# Patient Record
Sex: Male | Born: 1970 | Race: Black or African American | Hispanic: No | Marital: Single | Smoking: Former smoker
Health system: Southern US, Community
[De-identification: ages and names within clinical notes are randomized; demographics above are authoritative.]

## PROBLEM LIST (undated history)

## (undated) DIAGNOSIS — E119 Type 2 diabetes mellitus without complications: Secondary | ICD-10-CM

## (undated) DIAGNOSIS — R091 Pleurisy: Secondary | ICD-10-CM

## (undated) DIAGNOSIS — K0510 Chronic gingivitis, plaque induced: Secondary | ICD-10-CM

## (undated) DIAGNOSIS — I10 Essential (primary) hypertension: Secondary | ICD-10-CM

## (undated) DIAGNOSIS — K219 Gastro-esophageal reflux disease without esophagitis: Secondary | ICD-10-CM

## (undated) DIAGNOSIS — K859 Acute pancreatitis without necrosis or infection, unspecified: Secondary | ICD-10-CM

## (undated) HISTORY — PX: TONSILLECTOMY: SUR1361

---

## 2000-01-26 ENCOUNTER — Encounter: Payer: Self-pay | Admitting: Emergency Medicine

## 2000-01-26 ENCOUNTER — Emergency Department (HOSPITAL_COMMUNITY): Admission: EM | Admit: 2000-01-26 | Discharge: 2000-01-26 | Payer: Self-pay | Admitting: Emergency Medicine

## 2000-01-28 ENCOUNTER — Emergency Department (HOSPITAL_COMMUNITY): Admission: EM | Admit: 2000-01-28 | Discharge: 2000-01-28 | Payer: Self-pay | Admitting: Emergency Medicine

## 2003-12-19 ENCOUNTER — Emergency Department (HOSPITAL_COMMUNITY): Admission: EM | Admit: 2003-12-19 | Discharge: 2003-12-19 | Payer: Self-pay | Admitting: Emergency Medicine

## 2004-08-15 ENCOUNTER — Emergency Department (HOSPITAL_COMMUNITY): Admission: EM | Admit: 2004-08-15 | Discharge: 2004-08-16 | Payer: Self-pay | Admitting: Emergency Medicine

## 2005-05-07 ENCOUNTER — Emergency Department (HOSPITAL_COMMUNITY): Admission: EM | Admit: 2005-05-07 | Discharge: 2005-05-07 | Payer: Self-pay | Admitting: Emergency Medicine

## 2005-07-28 ENCOUNTER — Emergency Department (HOSPITAL_COMMUNITY): Admission: EM | Admit: 2005-07-28 | Discharge: 2005-07-28 | Payer: Self-pay | Admitting: Emergency Medicine

## 2005-07-29 ENCOUNTER — Emergency Department (HOSPITAL_COMMUNITY): Admission: EM | Admit: 2005-07-29 | Discharge: 2005-07-30 | Payer: Self-pay | Admitting: Emergency Medicine

## 2007-09-12 ENCOUNTER — Emergency Department (HOSPITAL_COMMUNITY): Admission: EM | Admit: 2007-09-12 | Discharge: 2007-09-12 | Payer: Self-pay | Admitting: Emergency Medicine

## 2010-12-09 ENCOUNTER — Emergency Department (HOSPITAL_BASED_OUTPATIENT_CLINIC_OR_DEPARTMENT_OTHER)
Admission: EM | Admit: 2010-12-09 | Discharge: 2010-12-09 | Disposition: A | Discharge status: Home / Self Care | Payer: Self-pay | Attending: Emergency Medicine | Admitting: Emergency Medicine

## 2010-12-09 DIAGNOSIS — N509 Disorder of male genital organs, unspecified: Secondary | ICD-10-CM | POA: Insufficient documentation

## 2010-12-09 LAB — URINALYSIS, ROUTINE W REFLEX MICROSCOPIC
Bilirubin Urine: NEGATIVE
Hgb urine dipstick: NEGATIVE
Ketones, ur: 15 mg/dL — AB
Nitrite: NEGATIVE
Protein, ur: NEGATIVE mg/dL
Specific Gravity, Urine: 1.028 (ref 1.005–1.030)
Urine Glucose, Fasting: NEGATIVE mg/dL
Urobilinogen, UA: 1 mg/dL (ref 0.0–1.0)
pH: 6.5 (ref 5.0–8.0)

## 2011-04-08 ENCOUNTER — Emergency Department (HOSPITAL_BASED_OUTPATIENT_CLINIC_OR_DEPARTMENT_OTHER)
Admission: EM | Admit: 2011-04-08 | Discharge: 2011-04-08 | Disposition: A | Discharge status: Home / Self Care | Payer: Self-pay | Attending: Emergency Medicine | Admitting: Emergency Medicine

## 2011-04-08 DIAGNOSIS — K089 Disorder of teeth and supporting structures, unspecified: Secondary | ICD-10-CM | POA: Insufficient documentation

## 2011-05-19 ENCOUNTER — Other Ambulatory Visit: Payer: Self-pay

## 2011-05-19 ENCOUNTER — Emergency Department (HOSPITAL_BASED_OUTPATIENT_CLINIC_OR_DEPARTMENT_OTHER)
Admission: EM | Admit: 2011-05-19 | Discharge: 2011-05-20 | Disposition: A | Discharge status: Home / Self Care | Payer: Self-pay | Attending: Emergency Medicine | Admitting: Emergency Medicine

## 2011-05-19 ENCOUNTER — Encounter: Payer: Self-pay | Admitting: *Deleted

## 2011-05-19 DIAGNOSIS — I10 Essential (primary) hypertension: Secondary | ICD-10-CM | POA: Insufficient documentation

## 2011-05-19 DIAGNOSIS — R079 Chest pain, unspecified: Secondary | ICD-10-CM | POA: Insufficient documentation

## 2011-05-19 HISTORY — DX: Essential (primary) hypertension: I10

## 2011-05-19 NOTE — ED Provider Notes (Signed)
History    Scribed for Aaron Roots, MD, the patient was seen in room MH06/MH06. This chart was scribed by Clarita Crane. This patient's care was started at 11:47PM.  CSN: [id number] Arrival date & time: 05/19/2011 11:02 PM  Chief Complaint  Patient presents with  . Chest Pain   HPI Patient is a male c/o constant non-radiating sternal chest pain described as a tightness onset 3 days ago and persistent since. Denies n/v/d, fever, chills, cough, diaphoresis, back pain, abdominal pain, numbness, tingling, palpitations, swelling of extremities. States pain is not aggravated or relieved by anything. When asked, states chest pain is not aggravated with deep breathing. Reports he had a stress test/cardiac craterization performed approximately 10 years ago. Denies personal h/o heart disease and DVT and notes his father was dx with cardiomegaly at approximately 42 years of age. Patient is a current everyday smoker, non-drinker and denies drug abuse.   [facility]- [facility]  PAST MEDICAL HISTORY:  Past Medical History  Diagnosis Date  . Hypertension     PAST SURGICAL HISTORY:  Past Surgical History  Procedure Date  . Tonsillectomy     MEDICATIONS:  Previous Medications   No medications on file     ALLERGIES:  Allergies as of 05/19/2011  . (No Known Allergies)     FAMILY HISTORY:  No family history on file.   SOCIAL HISTORY: History   Social History  . Marital Status: Single    Spouse Name: N/A    Number of Children: N/A  . Years of Education: N/A   Social History Main Topics  . Smoking status: Current Everyday Smoker  . Smokeless tobacco: None  . Alcohol Use: No  . Drug Use: No  . Sexually Active:    Other Topics Concern  . None   Social History Narrative  . None       Review of Systems 10 Systems reviewed and are negative for acute change except as noted in the HPI.  Physical Exam  BP 123/93  Pulse 94  Temp(Src) 98.2 F (36.8 C) (Oral)  Resp 16  SpO2  98%  Physical Exam  Nursing note and vitals reviewed. Constitutional: He is oriented to person, place, and time. Vital signs are normal. He appears well-developed and well-nourished.  HENT:  Head: Normocephalic and atraumatic.  Eyes: EOM are normal. No scleral icterus.  Neck: Neck supple.  Cardiovascular: Normal rate, regular rhythm, normal heart sounds and intact distal pulses.  Exam reveals no gallop and no friction rub.   No murmur heard. Pulmonary/Chest: Effort normal and breath sounds normal. He has no wheezes. He has no rales. He exhibits no tenderness.  Abdominal: Soft. Bowel sounds are normal. He exhibits no distension. There is no tenderness.  Musculoskeletal: Normal range of motion. He exhibits no edema and no tenderness.       Entire spine non-tender.   Neurological: He is alert and oriented to person, place, and time. No sensory deficit.  Skin: Skin is warm and dry.  Psychiatric: He has a normal mood and affect. His behavior is normal.    ED Course  Procedures  MDM Labs, iv ns. Monitor. Cxr.  ecg sinus rhythm, ratge 84, right axis, normal qrs, nonspec st changes.  Pt denies fam hx premature cad, states grandfather had unspecf heart problems.  Pt also requests refill of norvasc 5 mg for bp.   Results for orders placed during the hospital encounter of 05/19/11  CBC      Component Value Range  WBC 8.9  4.0 - 10.5 (K/uL)   RBC 5.19  4.22 - 5.81 (MIL/uL)   Hemoglobin 16.0  13.0 - 17.0 (g/dL)   HCT 62.1  30.8 - 65.7 (%)   MCV 87.5  78.0 - 100.0 (fL)   MCH 30.8  26.0 - 34.0 (pg)   MCHC 35.2  30.0 - 36.0 (g/dL)   RDW 84.6  96.2 - 95.2 (%)   Platelets 292  150 - 400 (K/uL)  CARDIAC PANEL(CRET KIN+CKTOT+MB+TROPI)      Component Value Range   Total CK 130  7 - 232 (U/L)   CK, MB 1.0  0.3 - 4.0 (ng/mL)   Troponin I <0.30  <0.30 (ng/mL)   Relative Index 0.8  0.0 - 2.5   COMPREHENSIVE METABOLIC PANEL      Component Value Range   Sodium 137  135 - 145 (mEq/L)    Potassium 4.0  3.5 - 5.1 (mEq/L)   Chloride 102  96 - 112 (mEq/L)   CO2 24  19 - 32 (mEq/L)   Glucose, Bld 91  70 - 99 (mg/dL)   BUN 11  6 - 23 (mg/dL)   Creatinine, Ser 8.41  0.50 - 1.35 (mg/dL)   Calcium 9.7  8.4 - 32.4 (mg/dL)   Total Protein 7.8  6.0 - 8.3 (g/dL)   Albumin 3.8  3.5 - 5.2 (g/dL)   AST 21  0 - 37 (U/L)   ALT 21  0 - 53 (U/L)   Alkaline Phosphatase 91  39 - 117 (U/L)   Total Bilirubin 0.2 (*) 0.3 - 1.2 (mg/dL)   GFR calc non Af Amer >60  >60 (mL/min)   GFR calc Af Amer >60  >60 (mL/min)    Dg Chest 2 View  05/20/2011  *RADIOLOGY REPORT*  Clinical Data: Chest pain and shortness of breath.  CHEST - 2 VIEW  Comparison: Chest x-ray 07/29/2005.  Findings: The cardiac silhouette, mediastinal and hilar contours are within normal limits and stable.  The lungs are clear.  The bony thorax is intact.  IMPRESSION: No acute cardiopulmonary findings.  Original Report Authenticated By: P. Loralie Champagne, M.D.   Pt reports constant symptoms for past 3 days after which ckmb/trop normal. Pt reports hx gerd, will rec symptomatic tx for same. Will also refer for close cardiology follow up and possible stress testing.   I personally performed the services described in this documentation, which was scribed in my presence. The recorded information has been reviewed and considered. Aaron Roots, MD    Aaron Roots, MD 05/20/11 [phone]

## 2011-05-19 NOTE — ED Notes (Signed)
Chest pain for several days °

## 2011-05-20 ENCOUNTER — Emergency Department (INDEPENDENT_AMBULATORY_CARE_PROVIDER_SITE_OTHER): Payer: Self-pay

## 2011-05-20 DIAGNOSIS — R0602 Shortness of breath: Secondary | ICD-10-CM

## 2011-05-20 DIAGNOSIS — R079 Chest pain, unspecified: Secondary | ICD-10-CM

## 2011-05-20 LAB — COMPREHENSIVE METABOLIC PANEL
ALT: 21 U/L (ref 0–53)
AST: 21 U/L (ref 0–37)
Alkaline Phosphatase: 91 U/L (ref 39–117)
CO2: 24 mEq/L (ref 19–32)
GFR calc Af Amer: >60 mL/min (ref >60)
GFR calc non Af Amer: >60 mL/min (ref >60)
Glucose, Bld: 91 mg/dL (ref 70–99)
Potassium: 4 mEq/L (ref 3.5–5.1)
Sodium: 137 mEq/L (ref 135–145)
Total Protein: 7.8 g/dL (ref 6.0–8.3)

## 2011-05-20 LAB — CBC
HCT: 45.4 % (ref 39.0–52.0)
Hemoglobin: 16 g/dL (ref 13.0–17.0)
MCH: 30.8 pg (ref 26.0–34.0)
MCHC: 35.2 g/dL (ref 30.0–36.0)
RBC: 5.19 MIL/uL (ref 4.22–5.81)

## 2011-05-20 LAB — CARDIAC PANEL(CRET KIN+CKTOT+MB+TROPI): Relative Index: 0.8 (ref 0.0–2.5)

## 2011-05-20 MED ORDER — AMLODIPINE BESYLATE 5 MG PO TABS: ORAL_TABLET | ORAL | Status: DC

## 2011-05-20 NOTE — ED Notes (Signed)
Pt presents to ED today with c/o CP for the last week.  Pt states pain in non-radiaitng mid sternal pain which is 10/10 at worst.  Pt undressed and placed in gown.  IV started and labs sent.  EKG done in triage.  Pt placed on cardiac monitor

## 2011-11-11 ENCOUNTER — Emergency Department (HOSPITAL_BASED_OUTPATIENT_CLINIC_OR_DEPARTMENT_OTHER)
Admission: EM | Admit: 2011-11-11 | Discharge: 2011-11-11 | Disposition: A | Discharge status: Home / Self Care | Payer: Self-pay | Attending: Emergency Medicine | Admitting: Emergency Medicine

## 2011-11-11 ENCOUNTER — Encounter (HOSPITAL_BASED_OUTPATIENT_CLINIC_OR_DEPARTMENT_OTHER): Payer: Self-pay | Admitting: *Deleted

## 2011-11-11 DIAGNOSIS — R091 Pleurisy: Secondary | ICD-10-CM | POA: Insufficient documentation

## 2011-11-11 DIAGNOSIS — R059 Cough, unspecified: Secondary | ICD-10-CM | POA: Insufficient documentation

## 2011-11-11 DIAGNOSIS — R05 Cough: Secondary | ICD-10-CM | POA: Insufficient documentation

## 2011-11-11 DIAGNOSIS — J40 Bronchitis, not specified as acute or chronic: Secondary | ICD-10-CM | POA: Insufficient documentation

## 2011-11-11 DIAGNOSIS — I10 Essential (primary) hypertension: Secondary | ICD-10-CM | POA: Insufficient documentation

## 2011-11-11 DIAGNOSIS — K219 Gastro-esophageal reflux disease without esophagitis: Secondary | ICD-10-CM | POA: Insufficient documentation

## 2011-11-11 DIAGNOSIS — F17200 Nicotine dependence, unspecified, uncomplicated: Secondary | ICD-10-CM | POA: Insufficient documentation

## 2011-11-11 DIAGNOSIS — Z79899 Other long term (current) drug therapy: Secondary | ICD-10-CM | POA: Insufficient documentation

## 2011-11-11 HISTORY — DX: Gastro-esophageal reflux disease without esophagitis: K21.9

## 2011-11-11 MED ORDER — AMLODIPINE BESYLATE 5 MG PO TABS: 5.0000 mg | ORAL_TABLET | Freq: Every day | ORAL | Status: DC

## 2011-11-11 MED ORDER — DEXLANSOPRAZOLE 30 MG PO CPDR: 30.0000 mg | DELAYED_RELEASE_CAPSULE | Freq: Every day | ORAL | Status: AC

## 2011-11-11 MED ORDER — PREDNISONE 20 MG PO TABS: 40.0000 mg | ORAL_TABLET | Freq: Every day | ORAL | Status: DC

## 2011-11-11 MED ORDER — IBUPROFEN 600 MG PO TABS: 600.0000 mg | ORAL_TABLET | Freq: Four times a day (QID) | ORAL | Status: AC | PRN

## 2011-11-11 NOTE — ED Provider Notes (Signed)
History     CSN: [id number]  Arrival date & time 11/11/11  0703   First MD Initiated Contact with Patient 11/11/11 0710      Chief Complaint  Patient presents with  . Bronchitis  . Cough     HPI Pt reports he has had cough and congestion pain in mid chest when he takes a deep breath. Pt states he saw his pmd on Thursday had ekg, labs and a stress test states he was told that all cardiac tests were normal with no blockages etc. States he has had bronchitis several times in past and that is what this feels like. States cough productive but clear to white in color. Pain with deep inspiration and and chest wall tenderness. Denies nausea vomiting or diarrhea denies fever or chills.  Had recent chest x-ray done at his doctor's office which he was told was negative.  Patient is a smoker.  Past Medical History  Diagnosis Date  . Hypertension   . GERD (gastroesophageal reflux disease)     Past Surgical History  Procedure Date  . Tonsillectomy     History reviewed. No pertinent family history.  History  Substance Use Topics  . Smoking status: Current Everyday Smoker  . Smokeless tobacco: Not on file  . Alcohol Use: No      Review of Systems Negative except as noted in history of present illness Allergies  Review of patient's allergies indicates no known allergies.  Home Medications   Current Outpatient Rx  Name Route Sig Dispense Refill  . AMLODIPINE BESYLATE 5 MG PO TABS  One (1) po once a day 30 tablet 0  . AMLODIPINE BESYLATE 5 MG PO TABS Oral Take 1 tablet (5 mg total) by mouth daily. 30 tablet 0  . DEXLANSOPRAZOLE 30 MG PO CPDR Oral Take 1 capsule (30 mg total) by mouth daily. 30 capsule 0  . IBUPROFEN 600 MG PO TABS Oral Take 1 tablet (600 mg total) by mouth every 6 (six) hours as needed for pain. 30 tablet 0  . PREDNISONE 20 MG PO TABS Oral Take 2 tablets (40 mg total) by mouth daily. 10 tablet 0    BP 128/86  Pulse 78  Temp(Src) 97.9 F (36.6 C) (Oral)  Resp  18  Wt 234 lb 5 oz (106.283 kg)  SpO2 100%  Physical Exam  Nursing note and vitals reviewed. Constitutional: He is oriented to person, place, and time. He appears well-developed and well-nourished. No distress.  HENT:  Head: Normocephalic and atraumatic.  Eyes: Pupils are equal, round, and reactive to light.  Neck: Normal range of motion.  Cardiovascular: Normal rate and intact distal pulses.   Pulmonary/Chest: No respiratory distress. He has no wheezes. He has no rales.  Abdominal: Normal appearance. He exhibits no distension.  Musculoskeletal: Normal range of motion.  Neurological: He is alert and oriented to person, place, and time. No cranial nerve deficit.  Skin: Skin is warm and dry. No rash noted.  Psychiatric: He has a normal mood and affect. His behavior is normal.    ED Course  Procedures (including critical care time)  Labs Reviewed - No data to display No results found.   1. Pleurisy   2. Bronchitis       MDM          Nelia Shi, MD 11/11/11 [phone]

## 2011-11-11 NOTE — ED Notes (Signed)
Pt reports he has had cough and congestion pain in mid chest when he takes a deep breath. Pt states he saw his pmd on Thursday had ekg, labs and a stress test states he was told that all cardiac tests were normal with no blockages etc. States he has had bronchitis several times in past and that is what this feels like. States cough productive but clear to white in color. Pain with deep inspiration and and chest wall tenderness. Denies nausea vomiting or diarrhea denies fever or chills

## 2011-12-18 ENCOUNTER — Encounter (HOSPITAL_BASED_OUTPATIENT_CLINIC_OR_DEPARTMENT_OTHER): Payer: Self-pay | Admitting: *Deleted

## 2011-12-18 ENCOUNTER — Emergency Department (HOSPITAL_BASED_OUTPATIENT_CLINIC_OR_DEPARTMENT_OTHER)
Admission: EM | Admit: 2011-12-18 | Discharge: 2011-12-18 | Disposition: A | Discharge status: Home / Self Care | Payer: Self-pay | Attending: Emergency Medicine | Admitting: Emergency Medicine

## 2011-12-18 DIAGNOSIS — J329 Chronic sinusitis, unspecified: Secondary | ICD-10-CM | POA: Insufficient documentation

## 2011-12-18 DIAGNOSIS — J069 Acute upper respiratory infection, unspecified: Secondary | ICD-10-CM | POA: Insufficient documentation

## 2011-12-18 DIAGNOSIS — K219 Gastro-esophageal reflux disease without esophagitis: Secondary | ICD-10-CM | POA: Insufficient documentation

## 2011-12-18 DIAGNOSIS — F17200 Nicotine dependence, unspecified, uncomplicated: Secondary | ICD-10-CM | POA: Insufficient documentation

## 2011-12-18 DIAGNOSIS — I10 Essential (primary) hypertension: Secondary | ICD-10-CM | POA: Insufficient documentation

## 2011-12-18 MED ORDER — AMOXICILLIN 500 MG PO CAPS: 500.0000 mg | ORAL_CAPSULE | Freq: Three times a day (TID) | ORAL | Status: AC

## 2011-12-18 MED ORDER — CETIRIZINE-PSEUDOEPHEDRINE ER 5-120 MG PO TB12: 1.0000 | ORAL_TABLET | Freq: Two times a day (BID) | ORAL | Status: AC | PRN

## 2011-12-18 NOTE — ED Notes (Signed)
Patient states he has had sinus congestion , productive cough with yellow brown secretions.  No fever.  Using otc with minimal relief.

## 2011-12-18 NOTE — Discharge Instructions (Signed)
Take antibiotic as prescribed. Take zyrtec d as need for congestion. Take tylenol/motrin as need. Follow up with primary care doctor in 1 week if symptoms fail to improve/resolve. Return to ER if worse, trouble breathing, severe headache, other concern.     Sinusitis Sinuses are air pockets within the bones of your face. The growth of bacteria within a sinus leads to infection. The infection prevents the sinuses from draining. This infection is called sinusitis. SYMPTOMS  There will be different areas of pain depending on which sinuses have become infected.  The maxillary sinuses often produce pain beneath the eyes.   Frontal sinusitis may cause pain in the middle of the forehead and above the eyes.  Other problems (symptoms) include:  Toothaches.   Colored, pus-like (purulent) drainage from the nose.   Swelling, warmth, and tenderness over the sinus areas may be signs of infection.  TREATMENT  Sinusitis is most often determined by an exam.X-rays may be taken. If x-rays have been taken, make sure you obtain your results or find out how you are to obtain them. Your caregiver may give you medications (antibiotics). These are medications that will help kill the bacteria causing the infection. You may also be given a medication (decongestant) that helps to reduce sinus swelling.  HOME CARE INSTRUCTIONS   Only take over-the-counter or prescription medicines for pain, discomfort, or fever as directed by your caregiver.   Drink extra fluids. Fluids help thin the mucus so your sinuses can drain more easily.   Applying either moist heat or ice packs to the sinus areas may help relieve discomfort.   Use saline nasal sprays to help moisten your sinuses. The sprays can be found at your local drugstore.  SEEK IMMEDIATE MEDICAL CARE IF:  You have a fever.   You have increasing pain, severe headaches, or toothache.   You have nausea, vomiting, or drowsiness.   You develop unusual swelling  around the face or trouble seeing.  MAKE SURE YOU:   Understand these instructions.   Will watch your condition.   Will get help right away if you are not doing well or get worse.  Document Released: 09/29/2005 Document Revised: 09/18/2011 Document Reviewed: 04/28/2007 [facility] Patient Information 2012 [place], [place].      Upper Respiratory Infection, Adult An upper respiratory infection (URI) is also known as the common cold. It is often caused by a type of germ (virus). Colds are easily spread (contagious). You can pass it to others by kissing, coughing, sneezing, or drinking out of the same glass. Usually, you get better in 1 or 2 weeks.  HOME CARE   Only take medicine as told by your doctor.   Use a warm mist humidifier or breathe in steam from a hot shower.   Drink enough water and fluids to keep your pee (urine) clear or pale yellow.   Get plenty of rest.   Return to work when your temperature is back to normal or as told by your doctor. You may use a face mask and wash your hands to stop your cold from spreading.  GET HELP RIGHT AWAY IF:   After the first few days, you feel you are getting worse.   You have questions about your medicine.   You have chills, shortness of breath, or brown or red spit (mucus).   You have yellow or brown snot (nasal discharge) or pain in the face, especially when you bend forward.   You have a fever, puffy (swollen) neck, pain  when you swallow, or white spots in the back of your throat.   You have a bad headache, ear pain, sinus pain, or chest pain.   You have a high-pitched whistling sound when you breathe in and out (wheezing).   You have a lasting cough or cough up blood.   You have sore muscles or a stiff neck.  MAKE SURE YOU:   Understand these instructions.   Will watch your condition.   Will get help right away if you are not doing well or get worse.  Document Released: 03/17/2008 Document Revised: 09/18/2011 Document  Reviewed: 02/03/2011 [facility] Patient Information 2012 [place], [place].

## 2011-12-18 NOTE — ED Provider Notes (Signed)
History     CSN: [id number]  Arrival date & time 12/18/11  1743   First MD Initiated Contact with Patient 12/18/11 1814      Chief Complaint  Patient presents with  . URI    (Consider location/radiation/quality/duration/timing/severity/associated sxs/prior treatment) Patient is a 41 y.o. male presenting with URI. The history is provided by the patient.  URI Primary symptoms do not include abdominal pain or rash.  The illness is not associated with chills.  pt c/o nasal congestion, drainage, sinus pain, subj fever in past few days. Also notes occ non productive cough, no sore throat. Dull frontal facial and sinus pain/headache, mild to mod. Gradual onset. No neck pain or stiffness. No trouble breathing or swallowing. No nvd. No chest pain.   Past Medical History  Diagnosis Date  . Hypertension   . GERD (gastroesophageal reflux disease)     Past Surgical History  Procedure Date  . Tonsillectomy     No family history on file.  History  Substance Use Topics  . Smoking status: Current Everyday Smoker -- 0.5 packs/day    Types: Cigars  . Smokeless tobacco: Not on file  . Alcohol Use: No      Review of Systems  Constitutional: Negative for chills.  HENT: Negative for neck pain and neck stiffness.   Eyes: Negative for redness.  Respiratory: Negative for shortness of breath.   Cardiovascular: Negative for chest pain.  Gastrointestinal: Negative for abdominal pain.  Genitourinary: Negative for flank pain.  Musculoskeletal: Negative for back pain.  Skin: Negative for rash.  Neurological: Negative for weakness, light-headedness and numbness.  Hematological: Does not bruise/bleed easily.  Psychiatric/Behavioral: Negative for confusion.    Allergies  Review of patient's allergies indicates no known allergies.  Home Medications   Current Outpatient Rx  Name Route Sig Dispense Refill  . AMLODIPINE BESYLATE 5 MG PO TABS Oral Take 5 mg by mouth daily.    .  DEXLANSOPRAZOLE 60 MG PO CPDR Oral Take 60 mg by mouth daily.    Marland Kitchen LORATADINE 10 MG PO TABS Oral Take 10 mg by mouth daily.    Marland Kitchen PHENYLEPHRINE HCL 10 MG PO TABS Oral Take 10 mg by mouth every 4 (four) hours as needed. For congestion      BP 133/87  Pulse 106  Temp(Src) 98.4 F (36.9 C) (Oral)  Resp 18  Ht 5\' 11"  (1.803 m)  Wt 235 lb (106.595 kg)  BMI 32.78 kg/m2  SpO2 97%  Physical Exam  Nursing note and vitals reviewed. Constitutional: He is oriented to person, place, and time. He appears well-developed and well-nourished. No distress.  HENT:  Head: Atraumatic.  Right Ear: External ear normal.  Left Ear: External ear normal.  Mouth/Throat: Oropharynx is clear and moist.       Nasal congestion. +bil maxillary/frontal sinus tenderness  Eyes: Pupils are equal, round, and reactive to light.  Neck: Neck supple. No tracheal deviation present.       No stiffness or rigidity  Cardiovascular: Normal rate, regular rhythm, normal heart sounds and intact distal pulses.  Exam reveals no gallop and no friction rub.   No murmur heard. Pulmonary/Chest: Effort normal and breath sounds normal. No accessory muscle usage. No respiratory distress.  Abdominal: Soft. He exhibits no distension. There is no tenderness.  Musculoskeletal: Normal range of motion. He exhibits no edema and no tenderness.  Lymphadenopathy:    He has no cervical adenopathy.  Neurological: He is alert and oriented to person, place, and  time.       Motor intact bil. Steady gait.   Skin: Skin is warm and dry. No rash noted.  Psychiatric: He has a normal mood and affect.    ED Course  Procedures (including critical care time)    MDM  Exam c/w sinusitis. Confirmed nkda w pt.         Suzi Roots, MD 12/18/11 [phone]

## 2011-12-20 ENCOUNTER — Emergency Department (INDEPENDENT_AMBULATORY_CARE_PROVIDER_SITE_OTHER): Payer: Self-pay

## 2011-12-20 ENCOUNTER — Other Ambulatory Visit: Payer: Self-pay

## 2011-12-20 ENCOUNTER — Encounter (HOSPITAL_BASED_OUTPATIENT_CLINIC_OR_DEPARTMENT_OTHER): Payer: Self-pay | Admitting: *Deleted

## 2011-12-20 ENCOUNTER — Emergency Department (HOSPITAL_BASED_OUTPATIENT_CLINIC_OR_DEPARTMENT_OTHER)
Admission: EM | Admit: 2011-12-20 | Discharge: 2011-12-20 | Disposition: A | Discharge status: Home Health | Payer: Self-pay | Attending: Emergency Medicine | Admitting: Emergency Medicine

## 2011-12-20 DIAGNOSIS — R079 Chest pain, unspecified: Secondary | ICD-10-CM

## 2011-12-20 DIAGNOSIS — Z79899 Other long term (current) drug therapy: Secondary | ICD-10-CM | POA: Insufficient documentation

## 2011-12-20 DIAGNOSIS — I10 Essential (primary) hypertension: Secondary | ICD-10-CM | POA: Insufficient documentation

## 2011-12-20 DIAGNOSIS — F17200 Nicotine dependence, unspecified, uncomplicated: Secondary | ICD-10-CM | POA: Insufficient documentation

## 2011-12-20 DIAGNOSIS — K219 Gastro-esophageal reflux disease without esophagitis: Secondary | ICD-10-CM | POA: Insufficient documentation

## 2011-12-20 LAB — DIFFERENTIAL
Band Neutrophils: 0 % (ref 0–10)
Basophils Absolute: 0 10*3/uL (ref 0.0–0.1)
Basophils Relative: 0 % (ref 0–1)
Blasts: 0 %
Lymphocytes Relative: 52 % — ABNORMAL HIGH (ref 12–46)
Lymphs Abs: 3.3 10*3/uL (ref 0.7–4.0)
Myelocytes: 0 %
Promyelocytes Absolute: 0 %

## 2011-12-20 LAB — CBC
HCT: 46.2 % (ref 39.0–52.0)
Hemoglobin: 16.5 g/dL (ref 13.0–17.0)
MCH: 31.1 pg (ref 26.0–34.0)
MCHC: 35.7 g/dL (ref 30.0–36.0)
RDW: 14.3 % (ref 11.5–15.5)

## 2011-12-20 LAB — BASIC METABOLIC PANEL
Calcium: 10.4 mg/dL (ref 8.4–10.5)
GFR calc Af Amer: >90 mL/min (ref >90)
GFR calc non Af Amer: 81 mL/min — ABNORMAL LOW (ref >90)
Potassium: 4.2 mEq/L (ref 3.5–5.1)
Sodium: 139 mEq/L (ref 135–145)

## 2011-12-20 LAB — TROPONIN I: Troponin I: <0.3 ng/mL (ref <0.30)

## 2011-12-20 MED ORDER — GI COCKTAIL ~~LOC~~
30.0000 mL | Freq: Once | ORAL | Status: AC
  Administered 2011-12-20: 30 mL via ORAL
  Filled 2011-12-20: qty 30

## 2011-12-20 NOTE — ED Provider Notes (Signed)
Medical screening examination/treatment/procedure(s) were performed by non-physician practitioner and as supervising physician I was immediately available for consultation/collaboration.   Glynn Octave, MD 12/20/11 1731

## 2011-12-20 NOTE — ED Notes (Signed)
Explained to patient that there was a lab delay, but patient refused to wait,

## 2011-12-20 NOTE — ED Notes (Signed)
Explained to patient that labs were taking longer than normal today and that we would do everything possible to expeditite  process

## 2011-12-20 NOTE — ED Notes (Signed)
Tried to call patient to explain lab results, no answer, will try to call again

## 2011-12-20 NOTE — ED Provider Notes (Signed)
History     CSN: [id number]  Arrival date & time 12/20/11  1208   First MD Initiated Contact with Patient 12/20/11 1214      No chief complaint on file.   (Consider location/radiation/quality/duration/timing/severity/associated sxs/prior treatment) HPI Comments: Pt states that he woke up this morning and burning intermittently in his chest:pt states that he denies sob:pt states that nothing makes it better or worse:pt states that he has been moving furniture the last couple of days:nothing makes the pain better or worse:pt states that he has been taking his gerd medication  Patient is a 41 y.o. male presenting with chest pain. The history is provided by the patient. No language interpreter was used.  Chest Pain The chest pain began 1 - 2 hours ago. Chest pain occurs intermittently. The chest pain is unchanged. The quality of the pain is described as burning. The pain does not radiate. Pertinent negatives for primary symptoms include no fever, no syncope, no shortness of breath, no nausea, no vomiting and no dizziness.  Pertinent negatives for associated symptoms include no diaphoresis, no near-syncope and no weakness. Treatments tried: muscle relaxer. Risk factors include smoking/tobacco exposure.  His past medical history is significant for hypertension.  His family medical history is significant for hyperlipidemia in family.     Past Medical History  Diagnosis Date  . Hypertension   . GERD (gastroesophageal reflux disease)     Past Surgical History  Procedure Date  . Tonsillectomy     No family history on file.  History  Substance Use Topics  . Smoking status: Current Everyday Smoker -- 0.5 packs/day    Types: Cigars  . Smokeless tobacco: Not on file  . Alcohol Use: No      Review of Systems  Constitutional: Negative for fever and diaphoresis.  Respiratory: Negative for shortness of breath.   Cardiovascular: Positive for chest pain. Negative for syncope and  near-syncope.  Gastrointestinal: Negative for nausea and vomiting.  Neurological: Negative for dizziness and weakness.  All other systems reviewed and are negative.    Allergies  Review of patient's allergies indicates no known allergies.  Home Medications   Current Outpatient Rx  Name Route Sig Dispense Refill  . AMLODIPINE BESYLATE 5 MG PO TABS Oral Take 5 mg by mouth daily.    . AMOXICILLIN 500 MG PO CAPS Oral Take 1 capsule (500 mg total) by mouth 3 (three) times daily. 30 capsule 0  . CETIRIZINE-PSEUDOEPHEDRINE ER 5-120 MG PO TB12 Oral Take 1 tablet by mouth 2 (two) times daily as needed for allergies. 20 tablet 0  . DEXLANSOPRAZOLE 60 MG PO CPDR Oral Take 60 mg by mouth daily.    Marland Kitchen LORATADINE 10 MG PO TABS Oral Take 10 mg by mouth daily.    Marland Kitchen PHENYLEPHRINE HCL 10 MG PO TABS Oral Take 10 mg by mouth every 4 (four) hours as needed. For congestion      BP 117/84  Pulse 96  Temp(Src) 98.1 F (36.7 C) (Oral)  Resp 16  Ht 5\' 11"  (1.803 m)  Wt 239 lb (108.41 kg)  BMI 33.33 kg/m2  SpO2 96%  Physical Exam  Nursing note and vitals reviewed. Constitutional: He is oriented to person, place, and time. He appears well-developed and well-nourished.  HENT:  Head: Normocephalic and atraumatic.  Neck: Neck supple.  Cardiovascular: Normal rate and regular rhythm.   Pulmonary/Chest: Effort normal and breath sounds normal. He exhibits no tenderness.  Abdominal: Bowel sounds are normal. There is no tenderness.  Musculoskeletal: Normal range of motion.  Neurological: He is alert and oriented to person, place, and time.  Skin: Skin is warm.  Psychiatric: He has a normal mood and affect.    ED Course  Procedures (including critical care time)  Labs Reviewed  DIFFERENTIAL - Abnormal; Notable for the following:    Neutrophils Relative 36 (*)    Lymphocytes Relative 52 (*)    All other components within normal limits  CBC  TROPONIN I  BASIC METABOLIC PANEL  TROPONIN I    PATHOLOGIST SMEAR REVIEW  BASIC METABOLIC PANEL   No results found.  Date: 12/20/2011  Rate: 94  Rhythm: normal sinus rhythm  QRS Axis: normal  Intervals: normal  ST/T Wave abnormalities: nonspecific T wave changes  Conduction Disutrbances:none  Narrative Interpretation:   Old EKG Reviewed: unchanged    1. Chest pain       MDM  Pt timi score is zero:pt is okay to follow up:pt has been pain free since being here:pt is refusing to stay for further markers or a resulted bmet :doubt acs        Teressa Lower, NP 12/20/11 1551

## 2011-12-20 NOTE — ED Notes (Signed)
Called Mr Radi and informed him that the MD stated labs did not indicate cardiac issues and that we would be glad to forward any necessary labs or information to his primary MD

## 2011-12-20 NOTE — ED Notes (Signed)
Patient states that he started having burning this AM on L side of chest, no meds taken other than daily meds, no n/v, no SOB

## 2011-12-22 LAB — PATHOLOGIST SMEAR REVIEW

## 2012-01-06 ENCOUNTER — Emergency Department (HOSPITAL_BASED_OUTPATIENT_CLINIC_OR_DEPARTMENT_OTHER)
Admission: EM | Admit: 2012-01-06 | Discharge: 2012-01-06 | Disposition: A | Discharge status: Home / Self Care | Payer: Self-pay | Attending: Emergency Medicine | Admitting: Emergency Medicine

## 2012-01-06 DIAGNOSIS — F17200 Nicotine dependence, unspecified, uncomplicated: Secondary | ICD-10-CM | POA: Insufficient documentation

## 2012-01-06 DIAGNOSIS — I10 Essential (primary) hypertension: Secondary | ICD-10-CM | POA: Insufficient documentation

## 2012-01-06 DIAGNOSIS — K219 Gastro-esophageal reflux disease without esophagitis: Secondary | ICD-10-CM | POA: Insufficient documentation

## 2012-01-06 DIAGNOSIS — J019 Acute sinusitis, unspecified: Secondary | ICD-10-CM | POA: Insufficient documentation

## 2012-01-06 MED ORDER — AZITHROMYCIN 250 MG PO TABS: 250.0000 mg | ORAL_TABLET | Freq: Every day | ORAL | Status: AC

## 2012-01-06 MED ORDER — AMOXICILLIN 500 MG PO CAPS: 1000.0000 mg | ORAL_CAPSULE | Freq: Three times a day (TID) | ORAL | Status: AC

## 2012-01-06 NOTE — ED Notes (Signed)
Pt sts had problem 2 wks ago was given ABT did not take as prescribed

## 2012-01-06 NOTE — Discharge Instructions (Signed)
Return to ER for severe or worsening symptoms.  If you develop severe or worsening symptoms such as severe headaches, high fevers or stiff neck he should return to the emergency department immediately. Start taking amoxicillin in the morning, take 1000 mg 3 times a day for 10 days. If after 5 days she did not feel any better, he may start to take the Z-Pak. I would encourage you to followup with family doctor within the next 3 or 4 days if still having symptoms.  RESOURCE GUIDE  Dental Problems  Patients with Medicaid: [facility] [phone] [address]                                           [phone] [address] Phone:  [phone]                                                  Phone:  [phone]  If unable to pay or uninsured, contact:  [facility] or [facility]. to become qualified for the adult dental clinic.  Chronic Pain Problems Contact [facility]  [phone] Patients need to be referred by their primary care doctor.  Insufficient Money for Medicine Contact [employer] Way:  call "211" or [facility] ([phone].  No Primary Care Doctor Call [facility]  [phone] Other agencies that provide inexpensive medical care    [facility]  [phone]    [facility]  [phone]    [facility]  [phone]    [facility]  [phone]    [facility]  [phone]    [facility]  [phone]  Psychological Services [facility] Behavioral Health  [phone] [facility] Services  [phone] [facility] Mental Health   [phone] (emergency services ([phone])  Substance Abuse Resources [facility]  [phone] [facility] [phone] [facility] [place] [phone] Floydene Flock [phone] Residential & Outpatient Substance Abuse Program  [phone]  Abuse/Neglect [facility] Child Abuse Hotline 2497886707 [facility] Child Abuse Hotline 334-740-9547 (After Hours)  Emergency Shelter [facility] [phone]  Maternity Homes Room at the [place] [facility] [place] [phone] [facility] 7873631410  MRSA Hotline #:   240 477 2799    [facility] Resources  [facility]     [employer]                          [facility] Dept. [address] [place]                       [address]      [address]  Patrecia Pace  [facility] Phone:  [phone]                                   Phone:  [phone]                 Phone:  [place] Phone:  [place] [phone] (After Hours)

## 2012-01-06 NOTE — ED Provider Notes (Signed)
History     CSN: [id number]  Arrival date & time 01/06/12  0301   First MD Initiated Contact with Patient 01/06/12 [phone]      Chief Complaint  Patient presents with  . Sinusitis    (Consider location/radiation/quality/duration/timing/severity/associated sxs/prior treatment) HPI Comments: 41 year old male with a history of a recent sinus infection for which he was partially treated with amoxicillin.  He did not take the medication exactly as prescribed, improved slightly but over the last several days this had worsening symptoms his symptoms include pressure in the sinuses, thick nasal drainage, sore throat and a cough. Symptoms are gradually getting worse, nothing makes this better or worse, the patient has tried Sudafed and Claritin at home with minimal relief.  He denies fevers, stiff neck, visual changes.  Patient is a 41 y.o. male presenting with sinusitis. The history is provided by the patient and medical records.  Sinusitis  Associated symptoms include congestion, sinus pressure, sore throat and cough. Pertinent negatives include no chills.    Past Medical History  Diagnosis Date  . Hypertension   . GERD (gastroesophageal reflux disease)     Past Surgical History  Procedure Date  . Tonsillectomy     No family history on file.  History  Substance Use Topics  . Smoking status: Current Everyday Smoker -- 0.5 packs/day    Types: Cigars  . Smokeless tobacco: Not on file  . Alcohol Use: No      Review of Systems  Constitutional: Negative for fever and chills.  HENT: Positive for congestion, sore throat and sinus pressure. Negative for neck pain and neck stiffness.   Respiratory: Positive for cough.     Allergies  Review of patient's allergies indicates no known allergies.  Home Medications   Current Outpatient Rx  Name Route Sig Dispense Refill  . AMLODIPINE BESYLATE 5 MG PO TABS Oral Take 5 mg by mouth daily.    . AMOXICILLIN 500 MG PO CAPS Oral Take 2  capsules (1,000 mg total) by mouth 3 (three) times daily. 60 capsule 0  . AZITHROMYCIN 250 MG PO TABS Oral Take 1 tablet (250 mg total) by mouth daily. 500mg  PO day 1, then 250mg  PO days 205 6 tablet 0  . CETIRIZINE-PSEUDOEPHEDRINE ER 5-120 MG PO TB12 Oral Take 1 tablet by mouth 2 (two) times daily as needed for allergies. 20 tablet 0  . DEXLANSOPRAZOLE 60 MG PO CPDR Oral Take 60 mg by mouth daily.    Marland Kitchen PHENYLEPHRINE HCL 10 MG PO TABS Oral Take 10 mg by mouth every 4 (four) hours as needed. For congestion      BP 121/89  Pulse 105  Temp(Src) 97.5 F (36.4 C) (Oral)  Resp 20  SpO2 100%  Physical Exam  Constitutional: He appears well-developed and well-nourished. No distress.  HENT:       Tympanic membranes clear bilaterally, nasal passages with swollen turbinates, no purulent material seen, oropharynx clear with mild erythema but no exudate asymmetry or hypertrophy.  Eyes: Conjunctivae are normal. Right eye exhibits no discharge. Left eye exhibits no discharge. No scleral icterus.  Neck: Normal range of motion. Neck supple.  Cardiovascular: Normal rate, regular rhythm and normal heart sounds.   Pulmonary/Chest: Effort normal and breath sounds normal. He has no wheezes. He has no rales.  Musculoskeletal: He exhibits no edema.  Lymphadenopathy:    He has no cervical adenopathy.  Neurological: He is alert. Coordination normal.  Skin: He is not diaphoretic.    ED Course  Procedures (including critical care time)  Labs Reviewed - No data to display No results found.   1. Acute sinusitis       MDM  Well appearing male with vital signs reflecting no fever or hypotension, symptoms consistent with worsening sinusitis.  Discharge Prescriptions include:  Amoxicillin - the should be used for 10 days Zithromax - given as alternative should the amoxicillin not work  Patient encouraged to continue using Sudafed and Claritin to help sinus drainage.  I do not suspect any other  significant infections given normal mental status, neurologic exam and no fever or headache.         Vida Roller, MD 01/06/12 [phone]

## 2012-03-05 ENCOUNTER — Emergency Department (HOSPITAL_BASED_OUTPATIENT_CLINIC_OR_DEPARTMENT_OTHER)
Admission: EM | Admit: 2012-03-05 | Discharge: 2012-03-05 | Disposition: A | Discharge status: Home / Self Care | Payer: Self-pay | Attending: Emergency Medicine | Admitting: Emergency Medicine

## 2012-03-05 ENCOUNTER — Encounter (HOSPITAL_BASED_OUTPATIENT_CLINIC_OR_DEPARTMENT_OTHER): Payer: Self-pay

## 2012-03-05 DIAGNOSIS — W57XXXA Bitten or stung by nonvenomous insect and other nonvenomous arthropods, initial encounter: Secondary | ICD-10-CM

## 2012-03-05 DIAGNOSIS — K219 Gastro-esophageal reflux disease without esophagitis: Secondary | ICD-10-CM | POA: Insufficient documentation

## 2012-03-05 DIAGNOSIS — IMO0001 Reserved for inherently not codable concepts without codable children: Secondary | ICD-10-CM | POA: Insufficient documentation

## 2012-03-05 DIAGNOSIS — I10 Essential (primary) hypertension: Secondary | ICD-10-CM | POA: Insufficient documentation

## 2012-03-05 DIAGNOSIS — F17200 Nicotine dependence, unspecified, uncomplicated: Secondary | ICD-10-CM | POA: Insufficient documentation

## 2012-03-05 MED ORDER — AMLODIPINE BESYLATE 5 MG PO TABS: 5.0000 mg | ORAL_TABLET | Freq: Every day | ORAL | Status: DC

## 2012-03-05 MED ORDER — LORATADINE 10 MG PO TABS: 10.0000 mg | ORAL_TABLET | Freq: Every day | ORAL | Status: DC

## 2012-03-05 MED ORDER — AMLODIPINE BESYLATE 10 MG PO TABS: 5.0000 mg | ORAL_TABLET | Freq: Every day | ORAL | Status: DC

## 2012-03-05 MED ORDER — LORATADINE 10 MG PO TABS
10.0000 mg | ORAL_TABLET | Freq: Once | ORAL | Status: AC
  Administered 2012-03-05: 10 mg via ORAL
  Filled 2012-03-05: qty 1

## 2012-03-05 NOTE — Discharge Instructions (Signed)
Insect Bite Mosquitoes, flies, fleas, bedbugs, and many other insects can bite. Insect bites are different from insect stings. A sting is when venom is injected into the skin. Some insect bites can transmit infectious diseases. SYMPTOMS  Insect bites usually turn red, swell, and itch for 2 to 4 days. They often go away on their own. TREATMENT  Your caregiver may prescribe antibiotic medicines if a bacterial infection develops in the bite. HOME CARE INSTRUCTIONS  Do not scratch the bite area.   Keep the bite area clean and dry. Wash the bite area thoroughly with soap and water.   Put ice or cool compresses on the bite area.   Put ice in a plastic bag.   Place a towel between your skin and the bag.   Leave the ice on for 20 minutes, 4 times a day for the first 2 to 3 days, or as directed.   You may apply a baking soda paste, cortisone cream, or calamine lotion to the bite area as directed by your caregiver. This can help reduce itching and swelling.   Only take over-the-counter or prescription medicines as directed by your caregiver.   If you are given antibiotics, take them as directed. Finish them even if you start to feel better.  You may need a tetanus shot if:  You cannot remember when you had your last tetanus shot.   You have never had a tetanus shot.   The injury broke your skin.  If you get a tetanus shot, your arm may swell, get red, and feel warm to the touch. This is common and not a problem. If you need a tetanus shot and you choose not to have one, there is a rare chance of getting tetanus. Sickness from tetanus can be serious. SEEK IMMEDIATE MEDICAL CARE IF:   You have increased pain, redness, or swelling in the bite area.   You see a red line on the skin coming from the bite.   You have a fever.   You have joint pain.   You have a headache or neck pain.   You have unusual weakness.   You have a rash.   You have chest pain or shortness of breath.   You  have abdominal pain, nausea, or vomiting.   You feel unusually tired or sleepy.  MAKE SURE YOU:   Understand these instructions.   Will watch your condition.   Will get help right away if you are not doing well or get worse.  Document Released: 11/06/2004 Document Revised: 09/18/2011 Document Reviewed: 04/30/2011 ExitCare Patient Information 2012 [employer]. 

## 2012-03-05 NOTE — ED Notes (Signed)
Pt sts something happen to right  arm 3 days ago.Lumps started swelling on it started to hurting and throbbing

## 2012-03-05 NOTE — ED Provider Notes (Signed)
History     CSN: [id number]  Arrival date & time 03/05/12  4132   First MD Initiated Contact with Patient 03/05/12 [phone]      Chief Complaint  Patient presents with  . Insect Bite    (Consider location/radiation/quality/duration/timing/severity/associated sxs/prior treatment) Patient is a 41 y.o. male presenting with rash. The history is provided by the patient. No language interpreter was used.  Rash  This is a new problem. The current episode started more than 2 days ago. The problem has not changed since onset.The problem is associated with nothing. There has been no fever. Affected Location: right forearm. The pain is at a severity of 7/10. The pain is severe. The pain has been constant since onset. Associated symptoms include itching. Pertinent negatives include no blisters and no weeping. He has tried nothing for the symptoms. The treatment provided no relief. Risk factors: unknown.  Does not recall bites of stings  Past Medical History  Diagnosis Date  . Hypertension   . GERD (gastroesophageal reflux disease)     Past Surgical History  Procedure Date  . Tonsillectomy     No family history on file.  History  Substance Use Topics  . Smoking status: Current Everyday Smoker -- 0.5 packs/day    Types: Cigars  . Smokeless tobacco: Not on file  . Alcohol Use: No      Review of Systems  Skin: Positive for itching and rash.  All other systems reviewed and are negative.    Allergies  Review of patient's allergies indicates no known allergies.  Home Medications   Current Outpatient Rx  Name Route Sig Dispense Refill  . AMLODIPINE BESYLATE 10 MG PO TABS Oral Take 0.5 tablets (5 mg total) by mouth daily. 10 tablet 0  . AMLODIPINE BESYLATE 5 MG PO TABS Oral Take 5 mg by mouth daily.    Marland Kitchen CETIRIZINE-PSEUDOEPHEDRINE ER 5-120 MG PO TB12 Oral Take 1 tablet by mouth 2 (two) times daily as needed for allergies. 20 tablet 0  . DEXLANSOPRAZOLE 60 MG PO CPDR Oral Take 60 mg  by mouth daily.    Marland Kitchen LORATADINE 10 MG PO TABS Oral Take 1 tablet (10 mg total) by mouth daily. 7 tablet 0  . PHENYLEPHRINE HCL 10 MG PO TABS Oral Take 10 mg by mouth every 4 (four) hours as needed. For congestion      BP 136/88  Pulse 73  Temp(Src) 98.2 F (36.8 C) (Oral)  Resp 20  Ht 5\' 11"  (1.803 m)  Wt 227 lb (102.967 kg)  BMI 31.66 kg/m2  SpO2 98%  Physical Exam  Constitutional: He is oriented to person, place, and time. He appears well-developed and well-nourished. No distress.  HENT:  Head: Normocephalic and atraumatic.  Eyes: Conjunctivae are normal. Pupils are equal, round, and reactive to light.  Neck: Normal range of motion. Neck supple.  Cardiovascular: Normal rate and regular rhythm.   Pulmonary/Chest: Effort normal and breath sounds normal.  Abdominal: Soft. Bowel sounds are normal.  Musculoskeletal: Normal range of motion.  Neurological: He is alert and oriented to person, place, and time.  Skin: Skin is warm and dry.       3 discreet lesions on the right dorsal forearm papular cw insect bites  Psychiatric: He has a normal mood and affect.    ED Course  Procedures (including critical care time)  Labs Reviewed - No data to display No results found.   1. Insect bites       MDM  The ED is not the place for refilling chronic medications.  You need to establish care with a primary care physician for refills on your routine medications.  Several days of your medication were sent through to your pharmacy until you can be seen.         Jasmine Awe, MD 03/05/12 [phone]

## 2012-07-04 ENCOUNTER — Emergency Department (HOSPITAL_BASED_OUTPATIENT_CLINIC_OR_DEPARTMENT_OTHER)
Admission: EM | Admit: 2012-07-04 | Discharge: 2012-07-04 | Disposition: A | Discharge status: Home / Self Care | Payer: Self-pay | Attending: Emergency Medicine | Admitting: Emergency Medicine

## 2012-07-04 ENCOUNTER — Encounter (HOSPITAL_BASED_OUTPATIENT_CLINIC_OR_DEPARTMENT_OTHER): Payer: Self-pay | Admitting: *Deleted

## 2012-07-04 DIAGNOSIS — K219 Gastro-esophageal reflux disease without esophagitis: Secondary | ICD-10-CM | POA: Insufficient documentation

## 2012-07-04 DIAGNOSIS — J40 Bronchitis, not specified as acute or chronic: Secondary | ICD-10-CM | POA: Insufficient documentation

## 2012-07-04 DIAGNOSIS — I10 Essential (primary) hypertension: Secondary | ICD-10-CM | POA: Insufficient documentation

## 2012-07-04 DIAGNOSIS — F17200 Nicotine dependence, unspecified, uncomplicated: Secondary | ICD-10-CM | POA: Insufficient documentation

## 2012-07-04 MED ORDER — AMOXICILLIN 500 MG PO CAPS: 500.0000 mg | ORAL_CAPSULE | Freq: Three times a day (TID) | ORAL | Status: AC

## 2012-07-04 NOTE — ED Provider Notes (Signed)
History     CSN: [id number]  Arrival date & time 07/04/12  1952   First MD Initiated Contact with Patient 07/04/12 2127      Chief Complaint  Patient presents with  . Cough    (Consider location/radiation/quality/duration/timing/severity/associated sxs/prior treatment) Patient is a 41 y.o. male presenting with cough. The history is provided by the patient. No language interpreter was used.  Cough This is a new problem. The current episode started yesterday. The problem occurs constantly. The problem has been gradually worsening. There has been no fever. The fever has been present for 3 to 4 days. Associated symptoms include shortness of breath. He has tried nothing for the symptoms. The treatment provided no relief. Risk factors: none. He is not a smoker. His past medical history is significant for bronchitis.  Pt complains of a cough and not being able to get up phelgm.    Past Medical History  Diagnosis Date  . Hypertension   . GERD (gastroesophageal reflux disease)     Past Surgical History  Procedure Date  . Tonsillectomy     History reviewed. No pertinent family history.  History  Substance Use Topics  . Smoking status: Current Every Day Smoker -- 0.5 packs/day    Types: Cigars  . Smokeless tobacco: Not on file  . Alcohol Use: No      Review of Systems  Respiratory: Positive for cough and shortness of breath.   All other systems reviewed and are negative.    Allergies  Review of patient's allergies indicates no known allergies.  Home Medications   Current Outpatient Rx  Name Route Sig Dispense Refill  . BIMATOPROST 0.03 % OP SOLN  1 drop at bedtime.    Marland Kitchen AMLODIPINE BESYLATE 5 MG PO TABS Oral Take 5 mg by mouth daily.    Marland Kitchen AMLODIPINE BESYLATE 5 MG PO TABS Oral Take 1 tablet (5 mg total) by mouth daily. 7 tablet 0  . CETIRIZINE-PSEUDOEPHEDRINE ER 5-120 MG PO TB12 Oral Take 1 tablet by mouth 2 (two) times daily as needed for allergies. 20 tablet 0  .  DEXLANSOPRAZOLE 60 MG PO CPDR Oral Take 60 mg by mouth daily.    Marland Kitchen LORATADINE 10 MG PO TABS Oral Take 1 tablet (10 mg total) by mouth daily. 7 tablet 0  . PHENYLEPHRINE HCL 10 MG PO TABS Oral Take 10 mg by mouth every 4 (four) hours as needed. For congestion      BP 120/80  Pulse 106  Temp 98 F (36.7 C) (Oral)  Resp 20  Ht 5\' 11"  (1.803 m)  Wt 232 lb (105.235 kg)  BMI 32.36 kg/m2  SpO2 100%  Physical Exam  Nursing note and vitals reviewed. Constitutional: He is oriented to person, place, and time. He appears well-developed and well-nourished.  HENT:  Head: Normocephalic and atraumatic.  Mouth/Throat: Oropharynx is clear and moist.  Eyes: Conjunctivae normal and EOM are normal. Pupils are equal, round, and reactive to light.  Neck: Normal range of motion. Neck supple.  Cardiovascular: Normal rate, regular rhythm and normal heart sounds.   Pulmonary/Chest: Effort normal and breath sounds normal.  Abdominal: Soft.  Musculoskeletal: Normal range of motion.  Neurological: He is alert and oriented to person, place, and time. He has normal reflexes.  Skin: Skin is warm.  Psychiatric: He has a normal mood and affect.    ED Course  Procedures (including critical care time)  Labs Reviewed - No data to display No results found.   1.  Bronchitis       MDM  Pt given rx for amoxicillin,   I advised return if symptoms worsen or change.         Lonia Skinner [place], [place] 07/04/12 2148  Lonia Skinner [place], [place] 07/04/12 2150

## 2012-07-04 NOTE — ED Notes (Signed)
PA @ bedside.

## 2012-07-04 NOTE — Discharge Instructions (Signed)

## 2012-07-04 NOTE — ED Notes (Signed)
Pt reports head cold since last Monday. Prod cough with yellow, green sputum since Thursday.

## 2012-07-05 NOTE — ED Provider Notes (Signed)
Medical screening examination/treatment/procedure(s) were performed by non-physician practitioner and as supervising physician I was immediately available for consultation/collaboration.   Carleene Cooper III, MD 07/05/12 [phone]

## 2012-07-11 ENCOUNTER — Encounter (HOSPITAL_BASED_OUTPATIENT_CLINIC_OR_DEPARTMENT_OTHER): Payer: Self-pay | Admitting: *Deleted

## 2012-07-11 ENCOUNTER — Emergency Department (HOSPITAL_BASED_OUTPATIENT_CLINIC_OR_DEPARTMENT_OTHER)
Admission: EM | Admit: 2012-07-11 | Discharge: 2012-07-11 | Disposition: A | Discharge status: Home / Self Care | Payer: Self-pay | Attending: Emergency Medicine | Admitting: Emergency Medicine

## 2012-07-11 ENCOUNTER — Emergency Department (HOSPITAL_BASED_OUTPATIENT_CLINIC_OR_DEPARTMENT_OTHER): Payer: Self-pay

## 2012-07-11 DIAGNOSIS — J189 Pneumonia, unspecified organism: Secondary | ICD-10-CM | POA: Insufficient documentation

## 2012-07-11 DIAGNOSIS — I10 Essential (primary) hypertension: Secondary | ICD-10-CM | POA: Insufficient documentation

## 2012-07-11 DIAGNOSIS — K219 Gastro-esophageal reflux disease without esophagitis: Secondary | ICD-10-CM | POA: Insufficient documentation

## 2012-07-11 DIAGNOSIS — F17200 Nicotine dependence, unspecified, uncomplicated: Secondary | ICD-10-CM | POA: Insufficient documentation

## 2012-07-11 LAB — CBC WITH DIFFERENTIAL/PLATELET
Eosinophils Relative: 3 % (ref 0–5)
Hemoglobin: 15.9 g/dL (ref 13.0–17.0)
Lymphocytes Relative: 47 % — ABNORMAL HIGH (ref 12–46)
Lymphs Abs: 3.8 10*3/uL (ref 0.7–4.0)
MCV: 87.5 fL (ref 78.0–100.0)
Platelets: 311 10*3/uL (ref 150–400)
RBC: 5.12 MIL/uL (ref 4.22–5.81)
WBC: 8.2 10*3/uL (ref 4.0–10.5)

## 2012-07-11 LAB — D-DIMER, QUANTITATIVE: D-Dimer, Quant: 0.27 ug/mL-FEU (ref 0.00–0.48)

## 2012-07-11 LAB — BASIC METABOLIC PANEL
CO2: 27 mEq/L (ref 19–32)
Glucose, Bld: 89 mg/dL (ref 70–99)
Potassium: 3.9 mEq/L (ref 3.5–5.1)
Sodium: 139 mEq/L (ref 135–145)

## 2012-07-11 MED ORDER — LEVOFLOXACIN 750 MG PO TABS: 750.0000 mg | ORAL_TABLET | Freq: Every day | ORAL | Status: DC

## 2012-07-11 MED ORDER — ALBUTEROL SULFATE HFA 108 (90 BASE) MCG/ACT IN AERS: 1.0000 | INHALATION_SPRAY | Freq: Four times a day (QID) | RESPIRATORY_TRACT | Status: DC | PRN

## 2012-07-11 MED ORDER — KETOROLAC TROMETHAMINE 60 MG/2ML IM SOLN
60.0000 mg | Freq: Once | INTRAMUSCULAR | Status: AC
  Administered 2012-07-11: 60 mg via INTRAMUSCULAR
  Filled 2012-07-11: qty 2

## 2012-07-11 MED ORDER — AMLODIPINE BESYLATE 10 MG PO TABS: 5.0000 mg | ORAL_TABLET | Freq: Every day | ORAL | Status: DC

## 2012-07-11 NOTE — ED Notes (Addendum)
Pt. C/o cough and congestion for the past 2 weeks. Denies fevers. Coughing up green/brown in color. States today he started having pain in his chest and upper back. States chest pain is worse when he breaths or lays down. resp even and unlabored. States he is currently taking amoxicillin for bronchitis.

## 2012-07-11 NOTE — ED Provider Notes (Signed)
History  This chart was scribed for Aaron Weber Smitty Cords, MD by Ladona Ridgel Day. This patient was seen in room MH03/MH03 and the patient's care was started at 2005.   CSN: [id number]  Arrival date & time 07/11/12  2005   First MD Initiated Contact with Patient 07/11/12 2103      Chief Complaint  Patient presents with  . Cough   Patient is a 41 y.o. male presenting with cough. The history is provided by the patient. No language interpreter was used.  Cough This is a new problem. The current episode started more than 1 week ago. The problem occurs constantly. The problem has been gradually worsening. The cough is productive of sputum. There has been no fever. Pertinent negatives include no chills and no shortness of breath. Treatments tried: sudafed. The treatment provided no relief. He is a smoker. His past medical history does not include COPD.  Aaron Weber is a 41 y.o. male who presents to the Emergency Department complaining of a constant gradually worsening productive cough/congestion with green/brown sputum for the past two weeks with associated chest/back pain with coughing. He states was seen here and given AMX, no halfway though bottle without improvement in symptoms. Hes tried sudafed with minimal relief. He denies any recent travel, leg swelling/pain. He smokes 2-3 cigars/day.  Past Medical History  Diagnosis Date  . Hypertension   . GERD (gastroesophageal reflux disease)     Past Surgical History  Procedure Date  . Tonsillectomy     No family history on file.  History  Substance Use Topics  . Smoking status: Current Every Day Smoker -- 0.5 packs/day    Types: Cigars  . Smokeless tobacco: Not on file  . Alcohol Use: No      Review of Systems  Constitutional: Negative for fever and chills.  HENT: Positive for congestion.   Respiratory: Positive for cough. Negative for shortness of breath.   Cardiovascular: Negative for palpitations and leg swelling.    Gastrointestinal: Negative for nausea, vomiting and abdominal pain.  Neurological: Negative for weakness.  All other systems reviewed and are negative.    Allergies  Review of patient's allergies indicates no known allergies.  Home Medications   Current Outpatient Rx  Name Route Sig Dispense Refill  . AMLODIPINE BESYLATE 5 MG PO TABS Oral Take 5 mg by mouth daily.    Marland Kitchen AMLODIPINE BESYLATE 5 MG PO TABS Oral Take 1 tablet (5 mg total) by mouth daily. 7 tablet 0  . AMOXICILLIN 500 MG PO CAPS Oral Take 1 capsule (500 mg total) by mouth 3 (three) times daily. 30 capsule 0  . BIMATOPROST 0.03 % OP SOLN  1 drop at bedtime.    Marland Kitchen CETIRIZINE-PSEUDOEPHEDRINE ER 5-120 MG PO TB12 Oral Take 1 tablet by mouth 2 (two) times daily as needed for allergies. 20 tablet 0  . DEXLANSOPRAZOLE 60 MG PO CPDR Oral Take 60 mg by mouth daily.    Marland Kitchen LORATADINE 10 MG PO TABS Oral Take 1 tablet (10 mg total) by mouth daily. 7 tablet 0  . PHENYLEPHRINE HCL 10 MG PO TABS Oral Take 10 mg by mouth every 4 (four) hours as needed. For congestion      Triage Vitals: BP 131/83  Pulse 96  Temp 98 F (36.7 C) (Oral)  Resp 14  Ht 5\' 11"  (1.803 m)  Wt 229 lb (103.874 kg)  BMI 31.94 kg/m2  SpO2 98%  Physical Exam  Nursing note and vitals reviewed. Constitutional: He is  oriented to person, place, and time. He appears well-developed and well-nourished. No distress.  HENT:  Head: Normocephalic and atraumatic.  Right Ear: External ear normal.  Left Ear: External ear normal.  Eyes: Conjunctivae normal and EOM are normal. Pupils are equal, round, and reactive to light.  Neck: Normal range of motion. Neck supple. No tracheal deviation present.  Cardiovascular: Normal rate, regular rhythm and normal heart sounds.   No murmur heard. Pulmonary/Chest: Effort normal and breath sounds normal. No respiratory distress. He has no wheezes. He has no rales.  Abdominal: Soft. Bowel sounds are normal. He exhibits no distension. There  is no tenderness. There is no rebound and no guarding.  Musculoskeletal: Normal range of motion. He exhibits no tenderness.  Neurological: He is alert and oriented to person, place, and time.  Skin: Skin is warm and dry.  Psychiatric: He has a normal mood and affect. His behavior is normal.    ED Course  Procedures (including critical care time) DIAGNOSTIC STUDIES: Oxygen Saturation is 98% on room air by my interpretation.    COORDINATION OF CARE: At 900 PM Discussed treatment plan with patient which includes Toradol, CXR, blood work, D-dimer, and heart markers. Patient agrees.   Labs Reviewed - No data to display No results found.   No diagnosis found.    MDM   Date: 07/11/2012  Rate:94  Rhythm: normal sinus rhythm  QRS Axis: normal  Intervals: normal  ST/T Wave abnormalities: normal  Conduction Disutrbances: none  Narrative Interpretation: unremarkable  Return for worsening cough fevers or any concerns.  Follow up with your PMD.  Return for chest pain Shortness of breath or any concerns     I personally performed the services described in this documentation, which was scribed in my presence. The recorded information has been reviewed and considered.        Jasmine Awe, MD 07/11/12 2237

## 2012-07-11 NOTE — ED Notes (Signed)
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 RN phoned Pharmacist at [facility] to check and make sure levaquin can be taken with INH; pharmacist sts there are no contraindications for taking them together. Pt reassured.

## 2012-07-21 ENCOUNTER — Emergency Department (HOSPITAL_BASED_OUTPATIENT_CLINIC_OR_DEPARTMENT_OTHER)
Admission: EM | Admit: 2012-07-21 | Discharge: 2012-07-21 | Disposition: A | Discharge status: Home / Self Care | Payer: Self-pay | Attending: Emergency Medicine | Admitting: Emergency Medicine

## 2012-07-21 ENCOUNTER — Emergency Department (HOSPITAL_BASED_OUTPATIENT_CLINIC_OR_DEPARTMENT_OTHER): Payer: Self-pay

## 2012-07-21 ENCOUNTER — Encounter (HOSPITAL_BASED_OUTPATIENT_CLINIC_OR_DEPARTMENT_OTHER): Payer: Self-pay | Admitting: Emergency Medicine

## 2012-07-21 DIAGNOSIS — I10 Essential (primary) hypertension: Secondary | ICD-10-CM | POA: Insufficient documentation

## 2012-07-21 DIAGNOSIS — Z87891 Personal history of nicotine dependence: Secondary | ICD-10-CM | POA: Insufficient documentation

## 2012-07-21 DIAGNOSIS — K219 Gastro-esophageal reflux disease without esophagitis: Secondary | ICD-10-CM | POA: Insufficient documentation

## 2012-07-21 DIAGNOSIS — R071 Chest pain on breathing: Secondary | ICD-10-CM | POA: Insufficient documentation

## 2012-07-21 DIAGNOSIS — R0789 Other chest pain: Secondary | ICD-10-CM

## 2012-07-21 MED ORDER — AMLODIPINE BESYLATE 5 MG PO TABS: 5.0000 mg | ORAL_TABLET | Freq: Every day | ORAL | Status: DC

## 2012-07-21 NOTE — ED Notes (Signed)
Pt previously treated for cough and bronchitis, completed coarse of antibiotics, 10/8 pt developed sharp right upper back pain when taking a deep breath

## 2012-07-21 NOTE — ED Provider Notes (Signed)
History     CSN: [id number]  Arrival date & time 07/21/12  0345   First MD Initiated Contact with Patient 07/21/12 0359      Chief Complaint  Patient presents with  . Cough    (Consider location/radiation/quality/duration/timing/severity/associated sxs/prior treatment) HPI Comments: Patient seen here 10 days ago for similar symptoms.  He was diagnosed with cough and bronchitis and treated with levaquin.  He completed this antibiotic and had started to improve somewhat.  His symptoms have worsened again the past two days.  He is now having pain in the right mid back when he breathes.  When he was here 10 days ago a D-Dimer was negative.  He denies any leg swelling.    Patient is a 41 y.o. male presenting with cough. The history is provided by the patient.  Cough This is a new problem. Episode onset: 2 weeks ago. The problem occurs constantly. The problem has been gradually worsening. The cough is non-productive. Pertinent negatives include no chest pain and no chills. Treatments tried: levaquin, motrin. The treatment provided no relief.    Past Medical History  Diagnosis Date  . Hypertension   . GERD (gastroesophageal reflux disease)   . Bronchitis     Past Surgical History  Procedure Date  . Tonsillectomy     History reviewed. No pertinent family history.  History  Substance Use Topics  . Smoking status: Former Smoker -- 0.5 packs/day    Types: Cigars  . Smokeless tobacco: Former Neurosurgeon    Quit date: 07/15/2012  . Alcohol Use: No      Review of Systems  Constitutional: Negative for chills.  Respiratory: Positive for cough.   Cardiovascular: Negative for chest pain.  All other systems reviewed and are negative.    Allergies  Review of patient's allergies indicates no known allergies.  Home Medications   Current Outpatient Rx  Name Route Sig Dispense Refill  . ALBUTEROL SULFATE HFA 108 (90 BASE) MCG/ACT IN AERS Inhalation Inhale 1-2 puffs into the lungs every  6 (six) hours as needed for wheezing. 1 Inhaler 0  . AMLODIPINE BESYLATE 10 MG PO TABS Oral Take 0.5 tablets (5 mg total) by mouth daily. 5 tablet 0  . AMLODIPINE BESYLATE 5 MG PO TABS Oral Take 1 tablet (5 mg total) by mouth daily. 7 tablet 0  . CETIRIZINE-PSEUDOEPHEDRINE ER 5-120 MG PO TB12 Oral Take 1 tablet by mouth 2 (two) times daily as needed for allergies. 20 tablet 0  . LEVOFLOXACIN 750 MG PO TABS Oral Take 1 tablet (750 mg total) by mouth daily. X 7 days 7 tablet 0  . AMLODIPINE BESYLATE 5 MG PO TABS Oral Take 5 mg by mouth daily.    Marland Kitchen BIMATOPROST 0.03 % OP SOLN  1 drop at bedtime.    . DEXLANSOPRAZOLE 60 MG PO CPDR Oral Take 60 mg by mouth daily.    . ISONIAZID 300 MG PO TABS Oral Take 300 mg by mouth 2 (two) times a week.    Marland Kitchen LORATADINE 10 MG PO TABS Oral Take 1 tablet (10 mg total) by mouth daily. 7 tablet 0  . PHENYLEPHRINE HCL 10 MG PO TABS Oral Take 10 mg by mouth every 4 (four) hours as needed. For congestion      BP 141/89  Pulse 87  Temp 98.7 F (37.1 C) (Oral)  Resp 18  SpO2 97%  Physical Exam  Nursing note and vitals reviewed. Constitutional: He is oriented to person, place, and time. He appears  well-developed and well-nourished. No distress.  HENT:  Head: Normocephalic and atraumatic.  Mouth/Throat: Oropharynx is clear and moist.  Neck: Normal range of motion. Neck supple.  Cardiovascular: Normal rate and regular rhythm.   No murmur heard. Pulmonary/Chest: Breath sounds normal. No respiratory distress. He has no wheezes.  Abdominal: Soft. Bowel sounds are normal. He exhibits no distension. There is no tenderness.  Musculoskeletal: Normal range of motion. He exhibits no edema.  Neurological: He is alert and oriented to person, place, and time.  Skin: Skin is warm and dry. He is not diaphoretic.    ED Course  Procedures (including critical care time)  Labs Reviewed - No data to display No results found.   No diagnosis found.   Date: 07/21/2012  Rate:  80  Rhythm: normal sinus rhythm  QRS Axis: normal  Intervals: normal  ST/T Wave abnormalities: normal  Conduction Disutrbances:none  Narrative Interpretation:   Old EKG Reviewed: unchanged    MDM  The xray of the chest is normal as is the ekg.  The possible infiltrates seen on the previous xrays appear to have cleared up.  He appears in no distress, the lungs and chest are clear and I do not believe any further workup is indicated at this time.  I suspect this is pleurisy.  I doubt pe as there is no hypoxia, tachypnea, and the D-Dimer on the previous visit was negative.  Will discharge to home.          Geoffery Lyons, MD 07/21/12 [phone]

## 2012-07-26 ENCOUNTER — Encounter (HOSPITAL_BASED_OUTPATIENT_CLINIC_OR_DEPARTMENT_OTHER): Payer: Self-pay | Admitting: Family Medicine

## 2012-07-26 ENCOUNTER — Emergency Department (HOSPITAL_BASED_OUTPATIENT_CLINIC_OR_DEPARTMENT_OTHER): Payer: Self-pay

## 2012-07-26 ENCOUNTER — Emergency Department (HOSPITAL_BASED_OUTPATIENT_CLINIC_OR_DEPARTMENT_OTHER)
Admission: EM | Admit: 2012-07-26 | Discharge: 2012-07-26 | Disposition: A | Discharge status: Home / Self Care | Payer: Self-pay | Attending: Emergency Medicine | Admitting: Emergency Medicine

## 2012-07-26 DIAGNOSIS — Z79899 Other long term (current) drug therapy: Secondary | ICD-10-CM | POA: Insufficient documentation

## 2012-07-26 DIAGNOSIS — R0789 Other chest pain: Secondary | ICD-10-CM

## 2012-07-26 DIAGNOSIS — R071 Chest pain on breathing: Secondary | ICD-10-CM | POA: Insufficient documentation

## 2012-07-26 DIAGNOSIS — I10 Essential (primary) hypertension: Secondary | ICD-10-CM | POA: Insufficient documentation

## 2012-07-26 DIAGNOSIS — K219 Gastro-esophageal reflux disease without esophagitis: Secondary | ICD-10-CM | POA: Insufficient documentation

## 2012-07-26 MED ORDER — HYDROCODONE-ACETAMINOPHEN 5-325 MG PO TABS: 1.0000 | ORAL_TABLET | ORAL | Status: DC | PRN

## 2012-07-26 NOTE — ED Notes (Signed)
Pt c/o right chest pain worse with deep inspiration. Pt sts he was seen recently for similar pain and diagnosed with pna, took course of abx. Pt denies shob except unable to take deep breath due to pain, denies n/v. Pt sts he went to the [facility] but was not seen and they told him 2 wks before they could see him.

## 2012-07-26 NOTE — ED Provider Notes (Signed)
History     CSN: [id number]  Arrival date & time 07/26/12  1128   First MD Initiated Contact with Patient 07/26/12 1202      Chief Complaint  Patient presents with  . Chest Pain    (Consider location/radiation/quality/duration/timing/severity/associated sxs/prior treatment) Patient is a 41 y.o. male presenting with chest pain. The history is provided by the patient.  Chest Pain Pertinent negatives for primary symptoms include no fever, no cough, no palpitations, no abdominal pain and no nausea.  Pertinent negatives for associated symptoms include no lower extremity edema. Associated symptoms comments: The patient returns to the ED for complaint of right sided chest pain. Recently treated (9/26) for pneumonia and recheck with clear chest x-ray on 07/21/12. He reports similar discomfort in chest throughout treatment that is worse now. No recurrent cough, no fever. He denies shortness of breath but that he feels the area of discomfort more with deep breathing and with movement. .     Past Medical History  Diagnosis Date  . Hypertension   . GERD (gastroesophageal reflux disease)   . Bronchitis     Past Surgical History  Procedure Date  . Tonsillectomy     No family history on file.  History  Substance Use Topics  . Smoking status: Former Smoker -- 0.5 packs/day    Types: Cigars  . Smokeless tobacco: Former Neurosurgeon    Quit date: 07/15/2012  . Alcohol Use: No      Review of Systems  Constitutional: Negative for fever.  Respiratory: Negative for cough.   Cardiovascular: Positive for chest pain. Negative for palpitations.  Gastrointestinal: Negative for nausea and abdominal pain.    Allergies  Review of patient's allergies indicates no known allergies.  Home Medications   Current Outpatient Rx  Name Route Sig Dispense Refill  . ALBUTEROL SULFATE HFA 108 (90 BASE) MCG/ACT IN AERS Inhalation Inhale 1-2 puffs into the lungs every 6 (six) hours as needed for wheezing. 1  Inhaler 0  . AMLODIPINE BESYLATE 10 MG PO TABS Oral Take 0.5 tablets (5 mg total) by mouth daily. 5 tablet 0  . AMLODIPINE BESYLATE 5 MG PO TABS Oral Take 5 mg by mouth daily.    Marland Kitchen AMLODIPINE BESYLATE 5 MG PO TABS Oral Take 1 tablet (5 mg total) by mouth daily. 7 tablet 0  . AMLODIPINE BESYLATE 5 MG PO TABS Oral Take 1 tablet (5 mg total) by mouth daily. 30 tablet 1  . BIMATOPROST 0.03 % OP SOLN  1 drop at bedtime.    Marland Kitchen CETIRIZINE-PSEUDOEPHEDRINE ER 5-120 MG PO TB12 Oral Take 1 tablet by mouth 2 (two) times daily as needed for allergies. 20 tablet 0  . DEXLANSOPRAZOLE 60 MG PO CPDR Oral Take 60 mg by mouth daily.    . ISONIAZID 300 MG PO TABS Oral Take 300 mg by mouth 2 (two) times a week.    Marland Kitchen LEVOFLOXACIN 750 MG PO TABS Oral Take 1 tablet (750 mg total) by mouth daily. X 7 days 7 tablet 0  . LORATADINE 10 MG PO TABS Oral Take 1 tablet (10 mg total) by mouth daily. 7 tablet 0  . PHENYLEPHRINE HCL 10 MG PO TABS Oral Take 10 mg by mouth every 4 (four) hours as needed. For congestion      BP 153/91  Pulse 92  Temp 98.1 F (36.7 C) (Oral)  Resp 18  Wt 230 lb (104.327 kg)  SpO2 98%  Physical Exam  Constitutional: He is oriented to person, place, and  time. He appears well-developed and well-nourished. No distress.  Neck: Normal range of motion.  Cardiovascular: Normal rate and regular rhythm.   No murmur heard. Pulmonary/Chest: Effort normal and breath sounds normal. He has no wheezes. He has no rales. He exhibits tenderness.  Abdominal: Soft. There is no tenderness. There is no rebound.  Musculoskeletal: He exhibits no edema.  Neurological: He is alert and oriented to person, place, and time.  Skin: Skin is warm and dry.    ED Course  Procedures (including critical care time)  Labs Reviewed - No data to display No results found.  Dg Chest 2 View  07/26/2012  *RADIOLOGY REPORT*  Clinical Data: Chest pain  CHEST - 2 VIEW  Comparison: 07/21/2012  Findings: Cardiomediastinal  silhouette is within normal limits. The lungs are clear. No pleural effusion.  No pneumothorax.  No acute osseous abnormality.  IMPRESSION: Normal chest.   Original Report Authenticated By: Harrel Lemon, M.D.    Dg Chest 2 View  07/21/2012  *RADIOLOGY REPORT*  Clinical Data: 41 year old male with cough and chest pain.  CHEST - 2 VIEW  Comparison: 07/11/2012 and earlier.  Findings: Stable and normal lung volumes. Normal cardiac size and mediastinal contours.  Visualized tracheal air column is within normal limits.  No pneumothorax or effusion.  No consolidation or confluent pulmonary opacity.  No persistent of the indistinct left lung area questioned on the most recent prior. No acute osseous abnormality identified.  IMPRESSION: Negative, no acute cardiopulmonary abnormality.   Original Report Authenticated By: Harley Hallmark, M.D.    Dg Chest 2 View  07/11/2012  *RADIOLOGY REPORT*  Clinical Data: Cough, congestion.  CHEST - 2 VIEW  Comparison: 12/20/2011  Findings: Cardiomediastinal contours are stable, within normal limits. Mild linear opacity within the left lung base and patchy opacity within the left upper lobe. No pleural effusion or pneumothorax. No acute osseous finding.  IMPRESSION: Mild opacities within the left upper and lower lobes may reflect atelectasis or mild infiltrates. Recommend radiograph follow-up when the patient's symptoms resolve to document resolution.   Original Report Authenticated By: Waneta Martins, M.D.    No diagnosis found.  1. Chest wall pain  MDM  Chest tenderness reproduces pain of complaint. VSS, clear chest x-ray on last visit without recurrent cough. Doubt pna, suspect muscular chest wall pain/costochondritis.        Rodena Medin, PA-C 07/26/12 1306

## 2012-07-26 NOTE — ED Provider Notes (Signed)
ECG shows normal sinus rhythm with a rate of 79, no ectopy. Normal axis. Normal P wave. Normal QRS. Normal intervals. Normal ST and T waves. Impression: normal ECG. When compared with ECG of 12/19/2003, no significant changes are seen.  Medical screening examination/treatment/procedure(s) were performed by non-physician practitioner and as supervising physician I was immediately available for consultation/collaboration.    Dione Booze, MD 07/28/12 [phone]

## 2012-08-09 ENCOUNTER — Encounter (HOSPITAL_BASED_OUTPATIENT_CLINIC_OR_DEPARTMENT_OTHER): Payer: Self-pay | Admitting: *Deleted

## 2012-08-09 ENCOUNTER — Emergency Department (HOSPITAL_BASED_OUTPATIENT_CLINIC_OR_DEPARTMENT_OTHER)
Admission: EM | Admit: 2012-08-09 | Discharge: 2012-08-09 | Disposition: A | Discharge status: Home / Self Care | Payer: Self-pay | Attending: Emergency Medicine | Admitting: Emergency Medicine

## 2012-08-09 ENCOUNTER — Emergency Department (HOSPITAL_BASED_OUTPATIENT_CLINIC_OR_DEPARTMENT_OTHER): Payer: Self-pay

## 2012-08-09 DIAGNOSIS — R079 Chest pain, unspecified: Secondary | ICD-10-CM | POA: Insufficient documentation

## 2012-08-09 DIAGNOSIS — K219 Gastro-esophageal reflux disease without esophagitis: Secondary | ICD-10-CM | POA: Insufficient documentation

## 2012-08-09 DIAGNOSIS — R091 Pleurisy: Secondary | ICD-10-CM | POA: Insufficient documentation

## 2012-08-09 DIAGNOSIS — Z87891 Personal history of nicotine dependence: Secondary | ICD-10-CM | POA: Insufficient documentation

## 2012-08-09 DIAGNOSIS — I10 Essential (primary) hypertension: Secondary | ICD-10-CM | POA: Insufficient documentation

## 2012-08-09 DIAGNOSIS — J40 Bronchitis, not specified as acute or chronic: Secondary | ICD-10-CM | POA: Insufficient documentation

## 2012-08-09 MED ORDER — AZITHROMYCIN 250 MG PO TABS: ORAL_TABLET | ORAL | Status: DC

## 2012-08-09 MED ORDER — PREDNISONE 10 MG PO TABS: ORAL_TABLET | ORAL | Status: DC

## 2012-08-09 MED ORDER — HYDROCODONE-ACETAMINOPHEN 5-325 MG PO TABS: 2.0000 | ORAL_TABLET | ORAL | Status: DC | PRN

## 2012-08-09 NOTE — ED Notes (Signed)
For the past 3 days his left shoulder has been hurting when he takes a deep breath.

## 2012-08-09 NOTE — ED Notes (Signed)
Langston Masker, EDPA at bedside to discuss d/c instructions and f/u care with pt

## 2012-08-09 NOTE — ED Notes (Signed)
Patient transported to X-ray 

## 2012-08-09 NOTE — ED Provider Notes (Signed)
History     CSN: [id number]  Arrival date & time 08/09/12  1635   First MD Initiated Contact with Patient 08/09/12 1709      Chief Complaint  Patient presents with  . Shoulder Pain    (Consider location/radiation/quality/duration/timing/severity/associated sxs/prior treatment) Patient is a 41 y.o. male presenting with chest pain. The history is provided by the patient. No language interpreter was used.  Chest Pain The chest pain began 3 - 5 days ago. Chest pain occurs constantly. The chest pain is worsening. The severity of the pain is moderate. The quality of the pain is described as aching. Chest pain is worsened by deep breathing. He tried nothing for the symptoms. Risk factors include no known risk factors.   Pt recently had pneumonia.   Pt complains of pain in his left shoulder when he takes a deep breath.   Past Medical History  Diagnosis Date  . Hypertension   . GERD (gastroesophageal reflux disease)   . Bronchitis     Past Surgical History  Procedure Date  . Tonsillectomy     No family history on file.  History  Substance Use Topics  . Smoking status: Former Smoker -- 0.5 packs/day    Types: Cigars  . Smokeless tobacco: Former Neurosurgeon    Quit date: 07/15/2012  . Alcohol Use: No      Review of Systems  Cardiovascular: Positive for chest pain.  Musculoskeletal: Positive for myalgias. Negative for joint swelling.  All other systems reviewed and are negative.    Allergies  Review of patient's allergies indicates no known allergies.  Home Medications   Current Outpatient Rx  Name Route Sig Dispense Refill  . ALBUTEROL SULFATE HFA 108 (90 BASE) MCG/ACT IN AERS Inhalation Inhale 1-2 puffs into the lungs every 6 (six) hours as needed for wheezing. 1 Inhaler 0  . AMLODIPINE BESYLATE 10 MG PO TABS Oral Take 0.5 tablets (5 mg total) by mouth daily. 5 tablet 0  . AMLODIPINE BESYLATE 5 MG PO TABS Oral Take 5 mg by mouth daily.    Marland Kitchen AMLODIPINE BESYLATE 5 MG PO  TABS Oral Take 1 tablet (5 mg total) by mouth daily. 7 tablet 0  . AMLODIPINE BESYLATE 5 MG PO TABS Oral Take 1 tablet (5 mg total) by mouth daily. 30 tablet 1  . BIMATOPROST 0.03 % OP SOLN  1 drop at bedtime.    Marland Kitchen CETIRIZINE-PSEUDOEPHEDRINE ER 5-120 MG PO TB12 Oral Take 1 tablet by mouth 2 (two) times daily as needed for allergies. 20 tablet 0  . DEXLANSOPRAZOLE 60 MG PO CPDR Oral Take 60 mg by mouth daily.    Marland Kitchen HYDROCODONE-ACETAMINOPHEN 5-325 MG PO TABS Oral Take 1 tablet by mouth every 4 (four) hours as needed for pain. 10 tablet 0  . ISONIAZID 300 MG PO TABS Oral Take 300 mg by mouth 2 (two) times a week.    Marland Kitchen LEVOFLOXACIN 750 MG PO TABS Oral Take 1 tablet (750 mg total) by mouth daily. X 7 days 7 tablet 0  . LORATADINE 10 MG PO TABS Oral Take 1 tablet (10 mg total) by mouth daily. 7 tablet 0  . PHENYLEPHRINE HCL 10 MG PO TABS Oral Take 10 mg by mouth every 4 (four) hours as needed. For congestion      BP 120/80  Pulse 102  Temp 98.4 F (36.9 C) (Oral)  Resp 20  SpO2 95%  Physical Exam  Nursing note and vitals reviewed. Constitutional: He is oriented to person, place,  and time. He appears well-developed and well-nourished.  HENT:  Head: Normocephalic and atraumatic.  Right Ear: External ear normal.  Eyes: Conjunctivae normal are normal. Pupils are equal, round, and reactive to light.  Neck: Normal range of motion.  Cardiovascular: Normal rate, regular rhythm and normal heart sounds.   Pulmonary/Chest: Effort normal and breath sounds normal.  Abdominal: Soft. Bowel sounds are normal.  Musculoskeletal: He exhibits tenderness.  Neurological: He is alert and oriented to person, place, and time.  Skin: Skin is warm.  Psychiatric: He has a normal mood and affect.    ED Course  Procedures (including critical care time)  Labs Reviewed - No data to display Dg Chest 2 View  08/09/2012  *RADIOLOGY REPORT*  Clinical Data: Cough  CHEST - 2 VIEW  Comparison: 07/26/2012  Findings:  Heart size and vascularity are normal.  Lungs are clear without infiltrate or effusion.  No mass lesion.  IMPRESSION: Negative   Original Report Authenticated By: Camelia Phenes, M.D.      No diagnosis found.    MDM  Chest xray is clear.  Pt reports he is still coughing  And has bad taste from cough.   I will treat with antibiotics, prednisone and hydrocodone.         Lonia Skinner [place], [place] 08/09/12 Windell Moment

## 2012-08-09 NOTE — ED Notes (Signed)
rx x 3 given for prednisone, zithromax and vicodin- pt given resource sheet for f/u care- d/c home with ride

## 2012-08-09 NOTE — ED Notes (Signed)
C/o sharp pain left medial shoulder with deep breathing. Denies SOB. Lungs clear bilaterally.

## 2012-08-10 NOTE — ED Provider Notes (Signed)
Medical screening examination/treatment/procedure(s) were performed by non-physician practitioner and as supervising physician I was immediately available for consultation/collaboration.   Carleene Cooper III, MD 08/10/12 [phone]

## 2012-09-19 ENCOUNTER — Emergency Department (HOSPITAL_BASED_OUTPATIENT_CLINIC_OR_DEPARTMENT_OTHER)
Admission: EM | Admit: 2012-09-19 | Discharge: 2012-09-19 | Disposition: A | Discharge status: Home / Self Care | Payer: Self-pay | Attending: Emergency Medicine | Admitting: Emergency Medicine

## 2012-09-19 ENCOUNTER — Encounter (HOSPITAL_BASED_OUTPATIENT_CLINIC_OR_DEPARTMENT_OTHER): Payer: Self-pay | Admitting: *Deleted

## 2012-09-19 DIAGNOSIS — J209 Acute bronchitis, unspecified: Secondary | ICD-10-CM | POA: Insufficient documentation

## 2012-09-19 DIAGNOSIS — M545 Low back pain, unspecified: Secondary | ICD-10-CM | POA: Insufficient documentation

## 2012-09-19 DIAGNOSIS — K5900 Constipation, unspecified: Secondary | ICD-10-CM | POA: Insufficient documentation

## 2012-09-19 DIAGNOSIS — Z792 Long term (current) use of antibiotics: Secondary | ICD-10-CM | POA: Insufficient documentation

## 2012-09-19 DIAGNOSIS — K219 Gastro-esophageal reflux disease without esophagitis: Secondary | ICD-10-CM | POA: Insufficient documentation

## 2012-09-19 DIAGNOSIS — Z87891 Personal history of nicotine dependence: Secondary | ICD-10-CM | POA: Insufficient documentation

## 2012-09-19 DIAGNOSIS — M549 Dorsalgia, unspecified: Secondary | ICD-10-CM

## 2012-09-19 DIAGNOSIS — I10 Essential (primary) hypertension: Secondary | ICD-10-CM | POA: Insufficient documentation

## 2012-09-19 DIAGNOSIS — Z79899 Other long term (current) drug therapy: Secondary | ICD-10-CM | POA: Insufficient documentation

## 2012-09-19 MED ORDER — POLYETHYLENE GLYCOL 3350 17 G PO PACK: 17.0000 g | PACK | Freq: Every day | ORAL | Status: DC

## 2012-09-19 MED ORDER — CYCLOBENZAPRINE HCL 10 MG PO TABS: 10.0000 mg | ORAL_TABLET | Freq: Two times a day (BID) | ORAL | Status: DC | PRN

## 2012-09-19 MED ORDER — GI COCKTAIL ~~LOC~~
30.0000 mL | Freq: Once | ORAL | Status: AC
  Administered 2012-09-19: 30 mL via ORAL
  Filled 2012-09-19: qty 30

## 2012-09-19 MED ORDER — HYDROCODONE-ACETAMINOPHEN 5-325 MG PO TABS
2.0000 | ORAL_TABLET | ORAL | Status: AC | PRN
Start: 2012-09-19 — End: 2012-09-29

## 2012-09-19 NOTE — ED Provider Notes (Signed)
Medical screening examination/treatment/procedure(s) were performed by non-physician practitioner and as supervising physician I was immediately available for consultation/collaboration.   Johnika Escareno, MD 09/19/12 2307 

## 2012-09-19 NOTE — ED Provider Notes (Signed)
History     CSN: [id number]  Arrival date & time 09/19/12  1543   First MD Initiated Contact with Patient 09/19/12 1730      Chief Complaint  Patient presents with  . Back Pain    (Consider location/radiation/quality/duration/timing/severity/associated sxs/prior treatment) Patient is a 41 y.o. male presenting with back pain. The history is provided by the patient.  Back Pain  This is a new problem. The current episode started more than 1 week ago. The problem occurs constantly. The problem has been gradually worsening. The pain is associated with no known injury. The pain is present in the lumbar spine. The quality of the pain is described as aching. The pain does not radiate. The pain is moderate. The symptoms are aggravated by bending, twisting and certain positions. The pain is worse during the night. Stiffness is present all day. He has tried analgesics for the symptoms. The treatment provided mild relief.  Pt thought pain was from constipation.  Pt reports some relief with a dose of episom salt  Past Medical History  Diagnosis Date  . Hypertension   . GERD (gastroesophageal reflux disease)   . Bronchitis     Past Surgical History  Procedure Date  . Tonsillectomy     No family history on file.  History  Substance Use Topics  . Smoking status: Former Smoker -- 0.5 packs/day    Types: Cigars  . Smokeless tobacco: Former Neurosurgeon    Quit date: 07/15/2012  . Alcohol Use: No      Review of Systems  Musculoskeletal: Positive for back pain.  All other systems reviewed and are negative.    Allergies  Review of patient's allergies indicates no known allergies.  Home Medications   Current Outpatient Rx  Name  Route  Sig  Dispense  Refill  . AMLODIPINE BESYLATE 5 MG PO TABS   Oral   Take 1 tablet (5 mg total) by mouth daily.   7 tablet   0   . AMLODIPINE BESYLATE 5 MG PO TABS   Oral   Take 1 tablet (5 mg total) by mouth daily.   30 tablet   1   . BIMATOPROST  0.03 % OP SOLN      1 drop at bedtime.         . BUDESONIDE 32 MCG/ACT NA SUSP   Nasal   Place 1 spray into the nose daily.         . DEXLANSOPRAZOLE 60 MG PO CPDR   Oral   Take 60 mg by mouth daily.         Marland Kitchen ESOMEPRAZOLE MAGNESIUM 20 MG PO CPDR   Oral   Take 20 mg by mouth daily before breakfast.         . ISONIAZID 300 MG PO TABS   Oral   Take 300 mg by mouth 2 (two) times a week.         Marland Kitchen LORATADINE 10 MG PO TABS   Oral   Take 1 tablet (10 mg total) by mouth daily.   7 tablet   0   . ALBUTEROL SULFATE HFA 108 (90 BASE) MCG/ACT IN AERS   Inhalation   Inhale 1-2 puffs into the lungs every 6 (six) hours as needed for wheezing.   1 Inhaler   0   . AMLODIPINE BESYLATE 10 MG PO TABS   Oral   Take 0.5 tablets (5 mg total) by mouth daily.   5 tablet   0   .  AMLODIPINE BESYLATE 5 MG PO TABS   Oral   Take 5 mg by mouth daily.         . AZITHROMYCIN 250 MG PO TABS      2 tablets 1st day then 1 tablet days2-5   6 tablet   0   . CETIRIZINE-PSEUDOEPHEDRINE ER 5-120 MG PO TB12   Oral   Take 1 tablet by mouth 2 (two) times daily as needed for allergies.   20 tablet   0   . HYDROCODONE-ACETAMINOPHEN 5-325 MG PO TABS   Oral   Take 1 tablet by mouth every 4 (four) hours as needed for pain.   10 tablet   0   . HYDROCODONE-ACETAMINOPHEN 5-325 MG PO TABS   Oral   Take 2 tablets by mouth every 4 (four) hours as needed for pain.   10 tablet   0   . LEVOFLOXACIN 750 MG PO TABS   Oral   Take 1 tablet (750 mg total) by mouth daily. X 7 days   7 tablet   0   . PHENYLEPHRINE HCL 10 MG PO TABS   Oral   Take 10 mg by mouth every 4 (four) hours as needed. For congestion         . PREDNISONE 10 MG PO TABS      6,5,4,3,2,1 taper   21 tablet   0     BP 135/93  Pulse 90  Temp 98.4 F (36.9 C) (Oral)  Resp 18  SpO2 98%  Physical Exam  Vitals reviewed. Constitutional: He appears well-developed and well-nourished.  HENT:  Head: Normocephalic  and atraumatic.  Right Ear: External ear normal.  Left Ear: External ear normal.  Nose: Nose normal.  Mouth/Throat: Oropharynx is clear and moist.  Eyes: Conjunctivae normal and EOM are normal. Pupils are equal, round, and reactive to light.  Neck: Normal range of motion. Neck supple.  Cardiovascular: Normal rate, normal heart sounds and intact distal pulses.   Pulmonary/Chest: Effort normal and breath sounds normal.  Abdominal: Soft.  Musculoskeletal: Normal range of motion.  Neurological: He is alert.  Skin: Skin is warm.  Psychiatric: He has a normal mood and affect.    ED Course  Procedures (including critical care time)  Labs Reviewed - No data to display No results found.   1. Back pain   2. Constipation       MDM  Pt reports he has had increased gas recently.   Pt given gi cocktail.   Pt request cyclobenaprine and a laxative.   Pt advised to follow up with him MD.   Pt given miralax, flexeril and hydrocodone for pain.        Lonia Skinner [place], [place] 09/19/12 1808  Lonia Skinner [place], [place] 09/19/12 [phone]

## 2012-09-19 NOTE — ED Notes (Signed)
Pt also c/o being "gassy". Pt sts he took a laxative on Friday that helped a little. Pt is passing gas.

## 2012-09-19 NOTE — ED Notes (Signed)
Low back pain x 1 week, denies known injury

## 2012-11-26 ENCOUNTER — Encounter (HOSPITAL_BASED_OUTPATIENT_CLINIC_OR_DEPARTMENT_OTHER): Payer: Self-pay | Admitting: *Deleted

## 2012-11-26 ENCOUNTER — Emergency Department (HOSPITAL_BASED_OUTPATIENT_CLINIC_OR_DEPARTMENT_OTHER): Payer: Self-pay

## 2012-11-26 ENCOUNTER — Emergency Department (HOSPITAL_BASED_OUTPATIENT_CLINIC_OR_DEPARTMENT_OTHER)
Admission: EM | Admit: 2012-11-26 | Discharge: 2012-11-27 | Disposition: A | Discharge status: Home / Self Care | Payer: Self-pay | Attending: Emergency Medicine | Admitting: Emergency Medicine

## 2012-11-26 DIAGNOSIS — J40 Bronchitis, not specified as acute or chronic: Secondary | ICD-10-CM | POA: Insufficient documentation

## 2012-11-26 DIAGNOSIS — R0789 Other chest pain: Secondary | ICD-10-CM

## 2012-11-26 DIAGNOSIS — Z79899 Other long term (current) drug therapy: Secondary | ICD-10-CM | POA: Insufficient documentation

## 2012-11-26 DIAGNOSIS — R071 Chest pain on breathing: Secondary | ICD-10-CM | POA: Insufficient documentation

## 2012-11-26 DIAGNOSIS — Z8709 Personal history of other diseases of the respiratory system: Secondary | ICD-10-CM | POA: Insufficient documentation

## 2012-11-26 DIAGNOSIS — F17200 Nicotine dependence, unspecified, uncomplicated: Secondary | ICD-10-CM | POA: Insufficient documentation

## 2012-11-26 DIAGNOSIS — IMO0002 Reserved for concepts with insufficient information to code with codable children: Secondary | ICD-10-CM | POA: Insufficient documentation

## 2012-11-26 DIAGNOSIS — I10 Essential (primary) hypertension: Secondary | ICD-10-CM | POA: Insufficient documentation

## 2012-11-26 DIAGNOSIS — Z792 Long term (current) use of antibiotics: Secondary | ICD-10-CM | POA: Insufficient documentation

## 2012-11-26 DIAGNOSIS — M549 Dorsalgia, unspecified: Secondary | ICD-10-CM | POA: Insufficient documentation

## 2012-11-26 DIAGNOSIS — K219 Gastro-esophageal reflux disease without esophagitis: Secondary | ICD-10-CM | POA: Insufficient documentation

## 2012-11-26 HISTORY — DX: Pleurisy: R09.1

## 2012-11-26 LAB — BASIC METABOLIC PANEL WITH GFR
BUN: 16 mg/dL (ref 6–23)
CO2: 25 meq/L (ref 19–32)
Calcium: 9.7 mg/dL (ref 8.4–10.5)
Chloride: 101 meq/L (ref 96–112)
Creatinine, Ser: 1 mg/dL (ref 0.50–1.35)
GFR calc Af Amer: >90 mL/min
GFR calc non Af Amer: >90 mL/min
Glucose, Bld: 122 mg/dL — ABNORMAL HIGH (ref 70–99)
Potassium: 3.6 meq/L (ref 3.5–5.1)
Sodium: 137 meq/L (ref 135–145)

## 2012-11-26 LAB — CBC WITH DIFFERENTIAL/PLATELET
Basophils Absolute: 0.1 K/uL (ref 0.0–0.1)
Basophils Relative: 1 % (ref 0–1)
Eosinophils Absolute: 0.1 K/uL (ref 0.0–0.7)
Eosinophils Relative: 2 % (ref 0–5)
HCT: 42.9 % (ref 39.0–52.0)
Hemoglobin: 15.2 g/dL (ref 13.0–17.0)
Lymphocytes Relative: 43 % (ref 12–46)
Lymphs Abs: 3.8 K/uL (ref 0.7–4.0)
MCH: 31.3 pg (ref 26.0–34.0)
MCHC: 35.4 g/dL (ref 30.0–36.0)
MCV: 88.3 fL (ref 78.0–100.0)
Monocytes Absolute: 0.8 K/uL (ref 0.1–1.0)
Monocytes Relative: 9 % (ref 3–12)
Neutro Abs: 4 K/uL (ref 1.7–7.7)
Neutrophils Relative %: 46 % (ref 43–77)
Platelets: 297 K/uL (ref 150–400)
RBC: 4.86 MIL/uL (ref 4.22–5.81)
RDW: 12.9 % (ref 11.5–15.5)
WBC: 8.7 K/uL (ref 4.0–10.5)

## 2012-11-26 LAB — TROPONIN I: Troponin I: <0.3 ng/mL (ref <0.30)

## 2012-11-26 MED ORDER — KETOROLAC TROMETHAMINE 60 MG/2ML IM SOLN
60.0000 mg | Freq: Once | INTRAMUSCULAR | Status: AC
  Administered 2012-11-26: 60 mg via INTRAMUSCULAR
  Filled 2012-11-26: qty 2

## 2012-11-26 NOTE — ED Notes (Signed)
Vital signs stable. 

## 2012-11-26 NOTE — ED Notes (Signed)
Pt. Talking on phone non stop.

## 2012-11-26 NOTE — ED Notes (Signed)
Pt c/o chest pain when he takes a deep breath. Denies cough or fever.

## 2012-11-27 LAB — D-DIMER, QUANTITATIVE: D-Dimer, Quant: <0.27 ug/mL-FEU (ref 0.00–0.48)

## 2012-11-27 MED ORDER — MELOXICAM 7.5 MG PO TABS: 7.5000 mg | ORAL_TABLET | Freq: Every day | ORAL | Status: DC

## 2012-11-27 MED ORDER — TRAMADOL HCL 50 MG PO TABS: 50.0000 mg | ORAL_TABLET | Freq: Four times a day (QID) | ORAL | Status: DC | PRN

## 2012-11-27 NOTE — ED Provider Notes (Signed)
History     CSN: [id number]  Arrival date & time 11/26/12  2231   First MD Initiated Contact with Patient 11/26/12 2259      Chief Complaint  Patient presents with  . Chest Pain  . Back Pain    (Consider location/radiation/quality/duration/timing/severity/associated sxs/prior treatment) Patient is a 42 y.o. male presenting with chest pain and back pain. The history is provided by the patient. No language interpreter was used.  Chest Pain Chest pain location: costochondral border. Pain radiates to:  Does not radiate Pain radiates to the back: no   Pain severity:  Severe Onset quality:  Gradual Duration:  3 days Timing:  Constant Progression:  Unchanged Chronicity:  Recurrent Context: breathing   Relieved by:  Nothing Worsened by:  Nothing tried Ineffective treatments:  None tried Associated symptoms: back pain   Associated symptoms: no abdominal pain, no diaphoresis, no fever and no shortness of breath   Risk factors: male sex   Back Pain Associated symptoms: chest pain   Associated symptoms: no abdominal pain and no fever     Past Medical History  Diagnosis Date  . Hypertension   . GERD (gastroesophageal reflux disease)   . Bronchitis   . Pleurisy     Past Surgical History  Procedure Laterality Date  . Tonsillectomy      History reviewed. No pertinent family history.  History  Substance Use Topics  . Smoking status: Current Every Day Smoker -- 0.50 packs/day    Types: Cigars  . Smokeless tobacco: Former Neurosurgeon    Quit date: 07/15/2012  . Alcohol Use: No      Review of Systems  Constitutional: Negative for fever and diaphoresis.  HENT: Negative for neck pain.   Respiratory: Negative for shortness of breath.   Cardiovascular: Positive for chest pain. Negative for leg swelling.  Gastrointestinal: Negative for abdominal pain.  Musculoskeletal: Positive for back pain.  All other systems reviewed and are negative.    Allergies  Review of patient's  allergies indicates no known allergies.  Home Medications   Current Outpatient Rx  Name  Route  Sig  Dispense  Refill  . amLODipine (NORVASC) 5 MG tablet   Oral   Take 5 mg by mouth daily.         Marland Kitchen amLODipine (NORVASC) 5 MG tablet   Oral   Take 1 tablet (5 mg total) by mouth daily.   7 tablet   0   . bimatoprost (LUMIGAN) 0.03 % ophthalmic solution      1 drop at bedtime.         . budesonide (RHINOCORT AQUA) 32 MCG/ACT nasal spray   Nasal   Place 1 spray into the nose daily.         Marland Kitchen esomeprazole (NEXIUM) 20 MG capsule   Oral   Take 20 mg by mouth daily before breakfast.         . isoniazid (NYDRAZID) 300 MG tablet   Oral   Take 300 mg by mouth 2 (two) times a week.         . loratadine (CLARITIN) 10 MG tablet   Oral   Take 1 tablet (10 mg total) by mouth daily.   7 tablet   0   . albuterol (PROVENTIL HFA;VENTOLIN HFA) 108 (90 BASE) MCG/ACT inhaler   Inhalation   Inhale 1-2 puffs into the lungs every 6 (six) hours as needed for wheezing.   1 Inhaler   0   . amLODipine (NORVASC) 10  MG tablet   Oral   Take 0.5 tablets (5 mg total) by mouth daily.   5 tablet   0   . amLODipine (NORVASC) 5 MG tablet   Oral   Take 1 tablet (5 mg total) by mouth daily.   30 tablet   1   . azithromycin (ZITHROMAX Z-PAK) 250 MG tablet      2 tablets 1st day then 1 tablet days2-5   6 tablet   0   . cetirizine-pseudoephedrine (ZYRTEC-D) 5-120 MG per tablet   Oral   Take 1 tablet by mouth 2 (two) times daily as needed for allergies.   20 tablet   0   . cyclobenzaprine (FLEXERIL) 10 MG tablet   Oral   Take 1 tablet (10 mg total) by mouth 2 (two) times daily as needed for muscle spasms.   20 tablet   0   . dexlansoprazole (DEXILANT) 60 MG capsule   Oral   Take 60 mg by mouth daily.         Marland Kitchen HYDROcodone-acetaminophen (NORCO/VICODIN) 5-325 MG per tablet   Oral   Take 1 tablet by mouth every 4 (four) hours as needed for pain.   10 tablet   0   .  HYDROcodone-acetaminophen (NORCO/VICODIN) 5-325 MG per tablet   Oral   Take 2 tablets by mouth every 4 (four) hours as needed for pain.   10 tablet   0   . levofloxacin (LEVAQUIN) 750 MG tablet   Oral   Take 1 tablet (750 mg total) by mouth daily. X 7 days   7 tablet   0   . phenylephrine (SUDAFED PE) 10 MG TABS   Oral   Take 10 mg by mouth every 4 (four) hours as needed. For congestion         . polyethylene glycol (MIRALAX) packet   Oral   Take 17 g by mouth daily.   14 each   0   . predniSONE (DELTASONE) 10 MG tablet      6,5,4,3,2,1 taper   21 tablet   0     BP 141/83  Pulse 101  Temp(Src) 98.8 F (37.1 C) (Oral)  Resp 18  Ht 5\' 10"  (1.778 m)  Wt 236 lb (107.049 kg)  BMI 33.86 kg/m2  SpO2 98%  Physical Exam  Constitutional: He is oriented to person, place, and time. He appears well-developed and well-nourished. No distress.  HENT:  Head: Normocephalic and atraumatic.  Mouth/Throat: Oropharynx is clear and moist.  Eyes: Conjunctivae are normal. Pupils are equal, round, and reactive to light.  Neck: Normal range of motion. Neck supple.  Cardiovascular: Normal rate, regular rhythm and intact distal pulses.   Pulmonary/Chest: Effort normal and breath sounds normal. He has no wheezes. He has no rales.  Abdominal: Soft. Bowel sounds are normal. There is no tenderness. There is no rebound and no guarding.  Musculoskeletal: Normal range of motion. He exhibits no edema and no tenderness.  Neurological: He is alert and oriented to person, place, and time.  Skin: Skin is warm and dry.  Psychiatric: He has a normal mood and affect.    ED Course  Procedures (including critical care time)  Labs Reviewed  BASIC METABOLIC PANEL - Abnormal; Notable for the following:    Glucose, Bld 122 (*)    All other components within normal limits  CBC WITH DIFFERENTIAL  TROPONIN I   Dg Chest 2 View  11/26/2012  *RADIOLOGY REPORT*  Clinical Data: Chest and  back pain  CHEST  - 2 VIEW  Comparison: 08/09/2012  Findings: Normal heart size.  No pleural effusion or edema.  There is no airspace consolidation identified.  IMPRESSION:  1.  No acute cardiopulmonary abnormalities.   Original Report Authenticated By: Signa Kell, M.D.      No diagnosis found.    MDM   Date: 11/27/2012  Rate: 94  Rhythm: normal sinus rhythm  QRS Axis: normal  Intervals: normal  ST/T Wave abnormalities: normal  Conduction Disutrbances: none  Narrative Interpretation: unremarkable      Doubt ACS, in the setting of ongoing symptoms one negative ekg and troponin is sufficient to exclude ACS    Shamera Yarberry K Jimesha Rising-Rasch, MD 11/27/12 0110

## 2012-12-11 ENCOUNTER — Encounter (HOSPITAL_BASED_OUTPATIENT_CLINIC_OR_DEPARTMENT_OTHER): Payer: Self-pay | Admitting: *Deleted

## 2012-12-11 ENCOUNTER — Emergency Department (HOSPITAL_BASED_OUTPATIENT_CLINIC_OR_DEPARTMENT_OTHER)
Admission: EM | Admit: 2012-12-11 | Discharge: 2012-12-11 | Disposition: A | Discharge status: Home / Self Care | Payer: Self-pay | Attending: Emergency Medicine | Admitting: Emergency Medicine

## 2012-12-11 DIAGNOSIS — G4700 Insomnia, unspecified: Secondary | ICD-10-CM | POA: Insufficient documentation

## 2012-12-11 DIAGNOSIS — Z792 Long term (current) use of antibiotics: Secondary | ICD-10-CM | POA: Insufficient documentation

## 2012-12-11 DIAGNOSIS — F17200 Nicotine dependence, unspecified, uncomplicated: Secondary | ICD-10-CM | POA: Insufficient documentation

## 2012-12-11 DIAGNOSIS — Z79899 Other long term (current) drug therapy: Secondary | ICD-10-CM | POA: Insufficient documentation

## 2012-12-11 DIAGNOSIS — Z8709 Personal history of other diseases of the respiratory system: Secondary | ICD-10-CM | POA: Insufficient documentation

## 2012-12-11 DIAGNOSIS — K219 Gastro-esophageal reflux disease without esophagitis: Secondary | ICD-10-CM | POA: Insufficient documentation

## 2012-12-11 DIAGNOSIS — I10 Essential (primary) hypertension: Secondary | ICD-10-CM | POA: Insufficient documentation

## 2012-12-11 MED ORDER — DIPHENHYDRAMINE HCL 25 MG PO CAPS: 25.0000 mg | ORAL_CAPSULE | Freq: Every evening | ORAL | Status: DC | PRN

## 2012-12-11 NOTE — ED Notes (Signed)
Pt c/o unable to sleep since 8:00pm on Wednesday night. Pt denies recent stress.

## 2012-12-11 NOTE — ED Provider Notes (Signed)
History     CSN: [id number]  Arrival date & time 12/11/12  4098   First MD Initiated Contact with Patient 12/11/12 0500      Chief Complaint  Patient presents with  . Insomnia    (Consider location/radiation/quality/duration/timing/severity/associated sxs/prior treatment) The history is provided by the patient.  Patient has not slept in 2 evening and is in the ED because he wants something to cure his insomnia.    Past Medical History  Diagnosis Date  . Hypertension   . GERD (gastroesophageal reflux disease)   . Bronchitis   . Pleurisy     Past Surgical History  Procedure Laterality Date  . Tonsillectomy      No family history on file.  History  Substance Use Topics  . Smoking status: Current Every Day Smoker -- 0.50 packs/day    Types: Cigars  . Smokeless tobacco: Former Neurosurgeon    Quit date: 07/15/2012  . Alcohol Use: No      Review of Systems  All other systems reviewed and are negative.    Allergies  Review of patient's allergies indicates no known allergies.  Home Medications   Current Outpatient Rx  Name  Route  Sig  Dispense  Refill  . albuterol (PROVENTIL HFA;VENTOLIN HFA) 108 (90 BASE) MCG/ACT inhaler   Inhalation   Inhale 1-2 puffs into the lungs every 6 (six) hours as needed for wheezing.   1 Inhaler   0   . amLODipine (NORVASC) 10 MG tablet   Oral   Take 0.5 tablets (5 mg total) by mouth daily.   5 tablet   0   . amLODipine (NORVASC) 5 MG tablet   Oral   Take 5 mg by mouth daily.         Marland Kitchen amLODipine (NORVASC) 5 MG tablet   Oral   Take 1 tablet (5 mg total) by mouth daily.   7 tablet   0   . amLODipine (NORVASC) 5 MG tablet   Oral   Take 1 tablet (5 mg total) by mouth daily.   30 tablet   1   . azithromycin (ZITHROMAX Z-PAK) 250 MG tablet      2 tablets 1st day then 1 tablet days2-5   6 tablet   0   . bimatoprost (LUMIGAN) 0.03 % ophthalmic solution      1 drop at bedtime.         . budesonide (RHINOCORT AQUA)  32 MCG/ACT nasal spray   Nasal   Place 1 spray into the nose daily.         . cetirizine-pseudoephedrine (ZYRTEC-D) 5-120 MG per tablet   Oral   Take 1 tablet by mouth 2 (two) times daily as needed for allergies.   20 tablet   0   . cyclobenzaprine (FLEXERIL) 10 MG tablet   Oral   Take 1 tablet (10 mg total) by mouth 2 (two) times daily as needed for muscle spasms.   20 tablet   0   . dexlansoprazole (DEXILANT) 60 MG capsule   Oral   Take 60 mg by mouth daily.         Marland Kitchen esomeprazole (NEXIUM) 20 MG capsule   Oral   Take 20 mg by mouth daily before breakfast.         . HYDROcodone-acetaminophen (NORCO/VICODIN) 5-325 MG per tablet   Oral   Take 1 tablet by mouth every 4 (four) hours as needed for pain.   10 tablet   0   .  HYDROcodone-acetaminophen (NORCO/VICODIN) 5-325 MG per tablet   Oral   Take 2 tablets by mouth every 4 (four) hours as needed for pain.   10 tablet   0   . isoniazid (NYDRAZID) 300 MG tablet   Oral   Take 300 mg by mouth 2 (two) times a week.         Marland Kitchen levofloxacin (LEVAQUIN) 750 MG tablet   Oral   Take 1 tablet (750 mg total) by mouth daily. X 7 days   7 tablet   0   . loratadine (CLARITIN) 10 MG tablet   Oral   Take 1 tablet (10 mg total) by mouth daily.   7 tablet   0   . meloxicam (MOBIC) 7.5 MG tablet   Oral   Take 1 tablet (7.5 mg total) by mouth daily.   7 tablet   0   . phenylephrine (SUDAFED PE) 10 MG TABS   Oral   Take 10 mg by mouth every 4 (four) hours as needed. For congestion         . polyethylene glycol (MIRALAX) packet   Oral   Take 17 g by mouth daily.   14 each   0   . predniSONE (DELTASONE) 10 MG tablet      6,5,4,3,2,1 taper   21 tablet   0   . traMADol (ULTRAM) 50 MG tablet   Oral   Take 1 tablet (50 mg total) by mouth every 6 (six) hours as needed for pain.   15 tablet   0     BP 156/95  Pulse 98  Temp(Src) 98.3 F (36.8 C) (Oral)  Resp 18  Wt 239 lb (108.41 kg)  BMI 34.29 kg/m2   SpO2 98%  Physical Exam  Constitutional: He is oriented to person, place, and time. He appears well-developed and well-nourished.  HENT:  Head: Normocephalic and atraumatic.  Mouth/Throat: Oropharynx is clear and moist.  Eyes: Conjunctivae are normal. Pupils are equal, round, and reactive to light.  Neck: Normal range of motion. Neck supple.  Cardiovascular: Normal rate and regular rhythm.   Pulmonary/Chest: Effort normal and breath sounds normal. He has no wheezes. He has no rales.  Abdominal: Soft. Bowel sounds are normal. There is no tenderness. There is no rebound and no guarding.  Musculoskeletal: Normal range of motion.  Neurological: He is alert and oriented to person, place, and time.  Skin: Skin is warm and dry.  Psychiatric: He has a normal mood and affect.    ED Course  Procedures (including critical care time)  Labs Reviewed - No data to display No results found.   No diagnosis found.    MDM  Patient is informed that this is not an emergency medical condition        Sky Borboa K Whitney Hillegass-Rasch, MD 12/11/12 [phone]

## 2013-01-12 ENCOUNTER — Emergency Department (HOSPITAL_BASED_OUTPATIENT_CLINIC_OR_DEPARTMENT_OTHER): Payer: Self-pay

## 2013-01-12 ENCOUNTER — Encounter (HOSPITAL_BASED_OUTPATIENT_CLINIC_OR_DEPARTMENT_OTHER): Payer: Self-pay | Admitting: *Deleted

## 2013-01-12 ENCOUNTER — Emergency Department (HOSPITAL_BASED_OUTPATIENT_CLINIC_OR_DEPARTMENT_OTHER)
Admission: EM | Admit: 2013-01-12 | Discharge: 2013-01-12 | Disposition: A | Discharge status: Home / Self Care | Payer: Self-pay | Attending: Emergency Medicine | Admitting: Emergency Medicine

## 2013-01-12 DIAGNOSIS — Z792 Long term (current) use of antibiotics: Secondary | ICD-10-CM | POA: Insufficient documentation

## 2013-01-12 DIAGNOSIS — M545 Low back pain, unspecified: Secondary | ICD-10-CM | POA: Insufficient documentation

## 2013-01-12 DIAGNOSIS — K5900 Constipation, unspecified: Secondary | ICD-10-CM | POA: Insufficient documentation

## 2013-01-12 DIAGNOSIS — I10 Essential (primary) hypertension: Secondary | ICD-10-CM | POA: Insufficient documentation

## 2013-01-12 DIAGNOSIS — F17200 Nicotine dependence, unspecified, uncomplicated: Secondary | ICD-10-CM | POA: Insufficient documentation

## 2013-01-12 DIAGNOSIS — K219 Gastro-esophageal reflux disease without esophagitis: Secondary | ICD-10-CM | POA: Insufficient documentation

## 2013-01-12 DIAGNOSIS — Z79899 Other long term (current) drug therapy: Secondary | ICD-10-CM | POA: Insufficient documentation

## 2013-01-12 DIAGNOSIS — Z8709 Personal history of other diseases of the respiratory system: Secondary | ICD-10-CM | POA: Insufficient documentation

## 2013-01-12 DIAGNOSIS — IMO0002 Reserved for concepts with insufficient information to code with codable children: Secondary | ICD-10-CM | POA: Insufficient documentation

## 2013-01-12 DIAGNOSIS — R109 Unspecified abdominal pain: Secondary | ICD-10-CM | POA: Insufficient documentation

## 2013-01-12 LAB — URINALYSIS, ROUTINE W REFLEX MICROSCOPIC
Ketones, ur: NEGATIVE mg/dL
Leukocytes, UA: NEGATIVE
Nitrite: NEGATIVE
Specific Gravity, Urine: 1.037 — ABNORMAL HIGH (ref 1.005–1.030)
pH: 5.5 (ref 5.0–8.0)

## 2013-01-12 LAB — COMPREHENSIVE METABOLIC PANEL
AST: 18 U/L (ref 0–37)
Albumin: 4.3 g/dL (ref 3.5–5.2)
BUN: 15 mg/dL (ref 6–23)
Calcium: 10.1 mg/dL (ref 8.4–10.5)
Creatinine, Ser: 1.1 mg/dL (ref 0.50–1.35)
Total Bilirubin: 0.2 mg/dL — ABNORMAL LOW (ref 0.3–1.2)
Total Protein: 8.5 g/dL — ABNORMAL HIGH (ref 6.0–8.3)

## 2013-01-12 LAB — CBC WITH DIFFERENTIAL/PLATELET
Basophils Absolute: 0 10*3/uL (ref 0.0–0.1)
Basophils Relative: 0 % (ref 0–1)
Eosinophils Absolute: 0.2 10*3/uL (ref 0.0–0.7)
Eosinophils Relative: 2 % (ref 0–5)
HCT: 45.5 % (ref 39.0–52.0)
Hemoglobin: 15.9 g/dL (ref 13.0–17.0)
MCH: 31.1 pg (ref 26.0–34.0)
MCHC: 34.9 g/dL (ref 30.0–36.0)
Monocytes Absolute: 1 10*3/uL (ref 0.1–1.0)
Monocytes Relative: 10 % (ref 3–12)
RDW: 13.9 % (ref 11.5–15.5)

## 2013-01-12 LAB — LIPASE, BLOOD: Lipase: 39 U/L (ref 11–59)

## 2013-01-12 MED ORDER — OXYCODONE-ACETAMINOPHEN 5-325 MG PO TABS
2.0000 | ORAL_TABLET | Freq: Once | ORAL | Status: AC
  Administered 2013-01-12: 2 via ORAL
  Filled 2013-01-12 (×2): qty 2

## 2013-01-12 NOTE — ED Provider Notes (Signed)
History     CSN: [id number]  Arrival date & time 01/12/13  0027   First MD Initiated Contact with Patient 01/12/13 [phone]      Chief Complaint  Patient presents with  . Back Pain  . Constipation    (Consider location/radiation/quality/duration/timing/severity/associated sxs/prior treatment) HPI Patient with diffuse crampy abdominal pain for four days worsening tonight with radiation bilaterally to low back.  Patient eating and drinking as normal without vomiting or diarrhea. Patient states feels like gas and has taken gasex and beano withour relief.  He has had similar pain in past associated with gas but has not lasted this long.  Patient ate whopper at 9 pm.  He has had normal bowel movements for him.  He does not drink alcohol regularly with last intake two weeks ago.  No uti symptoms, no abdominal surgeries.   Past Medical History  Diagnosis Date  . Hypertension   . GERD (gastroesophageal reflux disease)   . Bronchitis   . Pleurisy     Past Surgical History  Procedure Laterality Date  . Tonsillectomy      History reviewed. No pertinent family history.  History  Substance Use Topics  . Smoking status: Current Every Day Smoker -- 0.50 packs/day    Types: Cigars  . Smokeless tobacco: Former Neurosurgeon    Quit date: 07/15/2012  . Alcohol Use: No      Review of Systems  All other systems reviewed and are negative.    Allergies  Review of patient's allergies indicates no known allergies.  Home Medications   Current Outpatient Rx  Name  Route  Sig  Dispense  Refill  . amLODipine (NORVASC) 10 MG tablet   Oral   Take 0.5 tablets (5 mg total) by mouth daily.   5 tablet   0   . bimatoprost (LUMIGAN) 0.03 % ophthalmic solution      1 drop at bedtime.         . budesonide (RHINOCORT AQUA) 32 MCG/ACT nasal spray   Nasal   Place 1 spray into the nose daily.         Marland Kitchen esomeprazole (NEXIUM) 20 MG capsule   Oral   Take 20 mg by mouth daily before breakfast.        . isoniazid (NYDRAZID) 300 MG tablet   Oral   Take 300 mg by mouth 2 (two) times a week.         . loratadine (CLARITIN) 10 MG tablet   Oral   Take 1 tablet (10 mg total) by mouth daily.   7 tablet   0   . albuterol (PROVENTIL HFA;VENTOLIN HFA) 108 (90 BASE) MCG/ACT inhaler   Inhalation   Inhale 1-2 puffs into the lungs every 6 (six) hours as needed for wheezing.   1 Inhaler   0   . amLODipine (NORVASC) 5 MG tablet   Oral   Take 5 mg by mouth daily.         Marland Kitchen amLODipine (NORVASC) 5 MG tablet   Oral   Take 1 tablet (5 mg total) by mouth daily.   7 tablet   0   . amLODipine (NORVASC) 5 MG tablet   Oral   Take 1 tablet (5 mg total) by mouth daily.   30 tablet   1   . azithromycin (ZITHROMAX Z-PAK) 250 MG tablet      2 tablets 1st day then 1 tablet days2-5   6 tablet   0   . cyclobenzaprine (  FLEXERIL) 10 MG tablet   Oral   Take 1 tablet (10 mg total) by mouth 2 (two) times daily as needed for muscle spasms.   20 tablet   0   . dexlansoprazole (DEXILANT) 60 MG capsule   Oral   Take 60 mg by mouth daily.         . diphenhydrAMINE (BENADRYL) 25 mg capsule   Oral   Take 1 capsule (25 mg total) by mouth at bedtime as needed for itching.   6 capsule   0   . esomeprazole (NEXIUM) 20 MG capsule   Oral   Take 20 mg by mouth daily before breakfast.         . HYDROcodone-acetaminophen (NORCO/VICODIN) 5-325 MG per tablet   Oral   Take 1 tablet by mouth every 4 (four) hours as needed for pain.   10 tablet   0   . HYDROcodone-acetaminophen (NORCO/VICODIN) 5-325 MG per tablet   Oral   Take 2 tablets by mouth every 4 (four) hours as needed for pain.   10 tablet   0   . levofloxacin (LEVAQUIN) 750 MG tablet   Oral   Take 1 tablet (750 mg total) by mouth daily. X 7 days   7 tablet   0   . meloxicam (MOBIC) 7.5 MG tablet   Oral   Take 1 tablet (7.5 mg total) by mouth daily.   7 tablet   0   . phenylephrine (SUDAFED PE) 10 MG TABS   Oral   Take  10 mg by mouth every 4 (four) hours as needed. For congestion         . polyethylene glycol (MIRALAX) packet   Oral   Take 17 g by mouth daily.   14 each   0   . predniSONE (DELTASONE) 10 MG tablet      6,5,4,3,2,1 taper   21 tablet   0   . traMADol (ULTRAM) 50 MG tablet   Oral   Take 1 tablet (50 mg total) by mouth every 6 (six) hours as needed for pain.   15 tablet   0     BP 129/89  Pulse 110  Temp(Src) 98.8 F (37.1 C) (Oral)  Resp 16  Ht 5\' 11"  (1.803 m)  Wt 246 lb (111.585 kg)  BMI 34.33 kg/m2  SpO2 95%  Physical Exam  Nursing note and vitals reviewed. Constitutional: He is oriented to person, place, and time. He appears well-developed and well-nourished.  HENT:  Head: Normocephalic.  Right Ear: External ear normal.  Left Ear: External ear normal.  Mouth/Throat: Oropharynx is clear and moist.  Eyes: Conjunctivae are normal. Pupils are equal, round, and reactive to light.  Neck: Normal range of motion. Neck supple.  Cardiovascular: Normal rate, regular rhythm and normal heart sounds.   Pulmonary/Chest: Effort normal and breath sounds normal.  Abdominal: Soft. Bowel sounds are normal.  Mild diffuse tenderness  Musculoskeletal: Normal range of motion.  Neurological: He is alert and oriented to person, place, and time.  Skin: Skin is warm and dry.  Psychiatric: He has a normal mood and affect. His behavior is normal. Judgment and thought content normal.    ED Course  Procedures (including critical care time)  Labs Reviewed - No data to display No results found.   No diagnosis found.    MDM  Patient with mild diffuse crampy abdominal pain with workup here are consistent with constipation. His labs are normal and there is increased stool seen  on his KUB with no evidence of obstruction. His advised regarding constipation and abdominal pain. He is to use over-the-counter constipation medications. He is advised to return if he is worse at any time  especially if he begins having localized pain or fever or is unable to keep down food or medicines.       Hilario Quarry, MD 01/12/13 [phone]

## 2013-01-12 NOTE — ED Notes (Signed)
Pt c/o upper abd pain for few days that now moved into his back. Pt states that his last normal bowel mvmt was last Wednesday. Pt denies any N/V, no fever.

## 2013-05-29 ENCOUNTER — Emergency Department (HOSPITAL_BASED_OUTPATIENT_CLINIC_OR_DEPARTMENT_OTHER)
Admission: EM | Admit: 2013-05-29 | Discharge: 2013-05-30 | Disposition: A | Discharge status: Home / Self Care | Payer: Self-pay | Attending: Emergency Medicine | Admitting: Emergency Medicine

## 2013-05-29 ENCOUNTER — Encounter (HOSPITAL_BASED_OUTPATIENT_CLINIC_OR_DEPARTMENT_OTHER): Payer: Self-pay

## 2013-05-29 ENCOUNTER — Emergency Department (HOSPITAL_BASED_OUTPATIENT_CLINIC_OR_DEPARTMENT_OTHER): Payer: Self-pay

## 2013-05-29 DIAGNOSIS — J209 Acute bronchitis, unspecified: Secondary | ICD-10-CM | POA: Insufficient documentation

## 2013-05-29 DIAGNOSIS — Z8709 Personal history of other diseases of the respiratory system: Secondary | ICD-10-CM | POA: Insufficient documentation

## 2013-05-29 DIAGNOSIS — R0989 Other specified symptoms and signs involving the circulatory and respiratory systems: Secondary | ICD-10-CM | POA: Insufficient documentation

## 2013-05-29 DIAGNOSIS — F17200 Nicotine dependence, unspecified, uncomplicated: Secondary | ICD-10-CM | POA: Insufficient documentation

## 2013-05-29 DIAGNOSIS — K219 Gastro-esophageal reflux disease without esophagitis: Secondary | ICD-10-CM | POA: Insufficient documentation

## 2013-05-29 DIAGNOSIS — I10 Essential (primary) hypertension: Secondary | ICD-10-CM | POA: Insufficient documentation

## 2013-05-29 DIAGNOSIS — R062 Wheezing: Secondary | ICD-10-CM | POA: Insufficient documentation

## 2013-05-29 DIAGNOSIS — Z79899 Other long term (current) drug therapy: Secondary | ICD-10-CM | POA: Insufficient documentation

## 2013-05-29 NOTE — ED Notes (Signed)
Patient here with productive cough x 2 days with yellowish sputum. Reports worse at night when he lays down, smoker

## 2013-05-30 MED ORDER — GUAIFENESIN-CODEINE 100-10 MG/5ML PO SOLN: 5.0000 mL | Freq: Three times a day (TID) | ORAL | Status: DC | PRN

## 2013-05-30 MED ORDER — ALBUTEROL SULFATE HFA 108 (90 BASE) MCG/ACT IN AERS
2.0000 | INHALATION_SPRAY | RESPIRATORY_TRACT | Status: DC | PRN
  Administered 2013-05-30: 2 via RESPIRATORY_TRACT
  Filled 2013-05-30: qty 6.7

## 2013-05-30 MED ORDER — ALBUTEROL SULFATE (5 MG/ML) 0.5% IN NEBU
5.0000 mg | INHALATION_SOLUTION | Freq: Once | RESPIRATORY_TRACT | Status: AC
  Administered 2013-05-30: 5 mg via RESPIRATORY_TRACT
  Filled 2013-05-30: qty 1

## 2013-05-30 MED ORDER — AMOXICILLIN 500 MG PO CAPS: 500.0000 mg | ORAL_CAPSULE | Freq: Three times a day (TID) | ORAL | Status: DC

## 2013-05-30 NOTE — ED Notes (Signed)
Transported to xray 

## 2013-05-30 NOTE — ED Provider Notes (Signed)
CSN: [id number]     Arrival date & time 05/29/13  2307 History  This chart was scribed for Aaron Lyons, MD by Ardelia Mems, ED Scribe. This patient was seen in room MH06/[facility]06 and the patient's care was started at 12:30 AM.    Chief Complaint  Patient presents with  . Cough    The history is provided by the patient. No language interpreter was used.    HPI Comments: Aaron Weber is a 42 y.o. male with a history of bronchitis who presents to the Emergency Department complaining of a gradually worsening cough, productive of brownish-green sputum, over the past 3 days. He states that his cough is so forceful that his ribs are sore. He also reports associated wheezing over the past 3 days, and states that he has no history of COPD or asthma. He states that he has no history of cardiac disease. He states that he has taken Nyquil and Hall's cough drops without relief of symptoms. He states that he has recently been in contact with a friend's daughter who was sick with rhinorrhea and other symptoms. He denies fever, chills, nausea, vomiting or any other symptoms. He states that he was a current every day smoker of 0.5 packs/day, who quit smoking 3 days ago at the onset of current symptoms.   Past Medical History  Diagnosis Date  . Hypertension   . GERD (gastroesophageal reflux disease)   . Bronchitis   . Pleurisy    Past Surgical History  Procedure Laterality Date  . Tonsillectomy     No family history on file. History  Substance Use Topics  . Smoking status: Current Every Day Smoker -- 0.50 packs/day    Types: Cigars  . Smokeless tobacco: Former Neurosurgeon    Quit date: 07/15/2012  . Alcohol Use: No    Review of Systems A complete 10 system review of systems was obtained and all systems are negative except as noted in the HPI and PMH.   Allergies  Review of patient's allergies indicates no known allergies.  Home Medications   Current Outpatient Rx  Name  Route  Sig  Dispense   Refill  . amLODipine (NORVASC) 5 MG tablet   Oral   Take 5 mg by mouth daily.         Marland Kitchen amLODipine (NORVASC) 5 MG tablet   Oral   Take 1 tablet (5 mg total) by mouth daily.   30 tablet   1   . esomeprazole (NEXIUM) 20 MG capsule   Oral   Take 20 mg by mouth daily before breakfast.         . esomeprazole (NEXIUM) 20 MG capsule   Oral   Take 20 mg by mouth daily before breakfast.         . EXPIRED: loratadine (CLARITIN) 10 MG tablet   Oral   Take 1 tablet (10 mg total) by mouth daily.   7 tablet   0    Triage Vitals: BP 135/89  Pulse 100  Temp(Src) 99.4 F (37.4 C) (Oral)  Resp 20  SpO2 97%  Physical Exam  Nursing note and vitals reviewed. Constitutional: He is oriented to person, place, and time. He appears well-developed and well-nourished.  HENT:  Head: Normocephalic and atraumatic.  Right Ear: External ear normal.  Left Ear: External ear normal.  Nose: Nose normal.  Mouth/Throat: Oropharynx is clear and moist. No oropharyngeal exudate.  Eyes: Conjunctivae and EOM are normal.  Neck: Normal range of motion. Neck  supple. No tracheal deviation present.  Cardiovascular: Normal rate, regular rhythm and normal heart sounds.   Pulmonary/Chest: Effort normal. He has wheezes (bilateral expiratory wheezes present). He has no rales. He exhibits no tenderness.  Abdominal: Soft. Bowel sounds are normal. There is no tenderness.  Musculoskeletal: Normal range of motion. He exhibits no tenderness.  Neurological: He is alert and oriented to person, place, and time.  Skin: Skin is warm and dry. No rash noted.  Psychiatric: He has a normal mood and affect.    ED Course   Procedures (including critical care time)  DIAGNOSTIC STUDIES: Oxygen Saturation is 97% on RA, normal by my interpretation.    COORDINATION OF CARE: 12:36 AM- Pt advised of plan for treatment and pt agrees.   Labs Reviewed - No data to display  Dg Chest 2 View  05/30/2013   *RADIOLOGY REPORT*   Clinical Data: Cough, wheeze and congestion.  CHEST - 2 VIEW  Comparison: 11/26/2012  Findings: The The heart size and pulmonary vascularity are normal. The lungs appear clear and expanded without focal air space disease or consolidation. No blunting of the costophrenic angles.  No pneumothorax.  Mediastinal contours appear intact.  No significant changes since previous study.  IMPRESSION: No evidence of active pulmonary disease.   Original Report Authenticated By: Burman Nieves, M.D.   No diagnosis found.  MDM  Patient presents with a several day history of cough productive of green sputum and chest congestion. On exam he is afebrile and there is no evidence for hypoxemia or respiratory distress. Chest x-ray does not reveal any acute cardiopulmonary disease. He reports coughing up copious quantities of sputum for which I have decided to treat with an antibiotic. I suspect this is a mucopurulent bronchitis.       I personally performed the services described in this documentation, which was scribed in my presence. The recorded information has been reviewed and is accurate.      Aaron Lyons, MD 05/30/13 [phone]

## 2013-05-30 NOTE — ED Notes (Signed)
Breathing treatment in progress

## 2013-05-30 NOTE — ED Notes (Signed)
MD with pt  

## 2013-05-30 NOTE — ED Notes (Signed)
Returned from xray

## 2013-05-30 NOTE — Patient Instructions (Signed)
Instructed patient on the proper use of administering albuteral mdi via aerochamber patient tolerated well 

## 2013-06-07 ENCOUNTER — Encounter (HOSPITAL_BASED_OUTPATIENT_CLINIC_OR_DEPARTMENT_OTHER): Payer: Self-pay | Admitting: *Deleted

## 2013-06-07 ENCOUNTER — Emergency Department (HOSPITAL_BASED_OUTPATIENT_CLINIC_OR_DEPARTMENT_OTHER): Payer: Self-pay

## 2013-06-07 ENCOUNTER — Emergency Department (HOSPITAL_BASED_OUTPATIENT_CLINIC_OR_DEPARTMENT_OTHER)
Admission: EM | Admit: 2013-06-07 | Discharge: 2013-06-07 | Disposition: A | Discharge status: Home / Self Care | Payer: Self-pay | Attending: Emergency Medicine | Admitting: Emergency Medicine

## 2013-06-07 DIAGNOSIS — K219 Gastro-esophageal reflux disease without esophagitis: Secondary | ICD-10-CM | POA: Insufficient documentation

## 2013-06-07 DIAGNOSIS — Z8709 Personal history of other diseases of the respiratory system: Secondary | ICD-10-CM | POA: Insufficient documentation

## 2013-06-07 DIAGNOSIS — F17200 Nicotine dependence, unspecified, uncomplicated: Secondary | ICD-10-CM | POA: Insufficient documentation

## 2013-06-07 DIAGNOSIS — I10 Essential (primary) hypertension: Secondary | ICD-10-CM | POA: Insufficient documentation

## 2013-06-07 DIAGNOSIS — Z79899 Other long term (current) drug therapy: Secondary | ICD-10-CM | POA: Insufficient documentation

## 2013-06-07 DIAGNOSIS — R0789 Other chest pain: Secondary | ICD-10-CM | POA: Insufficient documentation

## 2013-06-07 DIAGNOSIS — J40 Bronchitis, not specified as acute or chronic: Secondary | ICD-10-CM | POA: Insufficient documentation

## 2013-06-07 MED ORDER — SULFAMETHOXAZOLE-TRIMETHOPRIM 800-160 MG PO TABS: 1.0000 | ORAL_TABLET | Freq: Two times a day (BID) | ORAL | Status: DC

## 2013-06-07 NOTE — ED Provider Notes (Signed)
CSN: [id number]     Arrival date & time 06/07/13  1803 History   First MD Initiated Contact with Patient 06/07/13 1929     Chief Complaint  Patient presents with  . Cough   (Consider location/radiation/quality/duration/timing/severity/associated sxs/prior Treatment) Patient is a 42 y.o. male presenting with cough. The history is provided by the patient. No language interpreter was used.  Cough Cough characteristics:  Productive Sputum characteristics:  Green Severity:  Moderate Onset quality:  Gradual Duration:  3 days Timing:  Constant Progression:  Worsening Chronicity:  New Smoker: yes   Relieved by:  Nothing Worsened by:  Nothing tried Ineffective treatments:  None tried Associated symptoms: chest pain   Pt reports he was on amoxicillian for bronchitis.  Pt finished antibiotic.  Pt reports he is coughing again and now has pain in his right chest  Past Medical History  Diagnosis Date  . Hypertension   . GERD (gastroesophageal reflux disease)   . Bronchitis   . Pleurisy    Past Surgical History  Procedure Laterality Date  . Tonsillectomy     No family history on file. History  Substance Use Topics  . Smoking status: Current Every Day Smoker -- 0.50 packs/day    Types: Cigars  . Smokeless tobacco: Former Neurosurgeon    Quit date: 07/15/2012  . Alcohol Use: No    Review of Systems  Respiratory: Positive for cough.   Cardiovascular: Positive for chest pain.  All other systems reviewed and are negative.    Allergies  Review of patient's allergies indicates no known allergies.  Home Medications   Current Outpatient Rx  Name  Route  Sig  Dispense  Refill  . amLODipine (NORVASC) 5 MG tablet   Oral   Take 5 mg by mouth daily.         Marland Kitchen amLODipine (NORVASC) 5 MG tablet   Oral   Take 1 tablet (5 mg total) by mouth daily.   30 tablet   1   . amoxicillin (AMOXIL) 500 MG capsule   Oral   Take 1 capsule (500 mg total) by mouth 3 (three) times daily.   21  capsule   0   . esomeprazole (NEXIUM) 20 MG capsule   Oral   Take 20 mg by mouth daily before breakfast.         . esomeprazole (NEXIUM) 20 MG capsule   Oral   Take 20 mg by mouth daily before breakfast.         . guaiFENesin-codeine 100-10 MG/5ML syrup   Oral   Take 5 mL by mouth 3 (three) times daily as needed for cough.   120 mL   0   . EXPIRED: loratadine (CLARITIN) 10 MG tablet   Oral   Take 1 tablet (10 mg total) by mouth daily.   7 tablet   0    BP 137/81  Pulse 92  Temp(Src) 98.6 F (37 C) (Oral)  Resp 18  SpO2 97% Physical Exam  Nursing note and vitals reviewed. Constitutional: He appears well-developed and well-nourished.  HENT:  Head: Normocephalic and atraumatic.  Eyes: Pupils are equal, round, and reactive to light.  Neck: Normal range of motion.  Cardiovascular: Normal rate.   Pulmonary/Chest: Effort normal and breath sounds normal.  Tender right chest wall  Abdominal: Soft. Bowel sounds are normal.  Musculoskeletal: Normal range of motion.  Neurological: He is alert.  Skin: Skin is warm.    ED Course  Procedures (including critical care time) Labs  Review Labs Reviewed - No data to display Imaging Review Dg Chest 2 View  06/07/2013   *RADIOLOGY REPORT*  Clinical Data: Cough, right lung pain, history of bronchitis and smoking  CHEST - 2 VIEW  Comparison: 05/27/2013; 11/26/2012  Findings: Grossly unchanged cardiac silhouette and mediastinal contours.  There is mild diffuse slightly nodular thickening of the pulmonary interstitium.  No focal airspace opacities.  No pleural effusion or pneumothorax.  No evidence of edema.  Unchanged bones.  IMPRESSION: Findings suggestive of airways disease.  No focal airspace opacities to suggest pneumonia.   Original Report Authenticated By: Tacey Ruiz, MD    MDM   1. Bronchitis    Bactrim ds    Elson Areas, PA-C 06/07/13 2002

## 2013-06-07 NOTE — ED Notes (Signed)
Patient states he was recently treated for bronchitis, but is feeling the same and now his lung is hurting.

## 2013-06-08 NOTE — ED Provider Notes (Signed)
Medical screening examination/treatment/procedure(s) were performed by non-physician practitioner and as supervising physician I was immediately available for consultation/collaboration.   Audree Camel, MD 06/08/13 1149

## 2013-10-10 ENCOUNTER — Encounter (HOSPITAL_BASED_OUTPATIENT_CLINIC_OR_DEPARTMENT_OTHER): Payer: Self-pay | Admitting: Emergency Medicine

## 2013-10-10 DIAGNOSIS — J328 Other chronic sinusitis: Secondary | ICD-10-CM | POA: Insufficient documentation

## 2013-10-10 DIAGNOSIS — Z79899 Other long term (current) drug therapy: Secondary | ICD-10-CM | POA: Insufficient documentation

## 2013-10-10 DIAGNOSIS — K219 Gastro-esophageal reflux disease without esophagitis: Secondary | ICD-10-CM | POA: Insufficient documentation

## 2013-10-10 DIAGNOSIS — Z792 Long term (current) use of antibiotics: Secondary | ICD-10-CM | POA: Insufficient documentation

## 2013-10-10 DIAGNOSIS — F17200 Nicotine dependence, unspecified, uncomplicated: Secondary | ICD-10-CM | POA: Insufficient documentation

## 2013-10-10 DIAGNOSIS — I10 Essential (primary) hypertension: Secondary | ICD-10-CM | POA: Insufficient documentation

## 2013-10-10 NOTE — ED Notes (Signed)
C/o bilat "ears stopped up" "sinus pressure"-also feels heart is racing but denies pain/fever-s/s started this am

## 2013-10-11 ENCOUNTER — Emergency Department (HOSPITAL_BASED_OUTPATIENT_CLINIC_OR_DEPARTMENT_OTHER)
Admission: EM | Admit: 2013-10-11 | Discharge: 2013-10-11 | Disposition: A | Discharge status: Home / Self Care | Payer: Self-pay | Attending: Emergency Medicine | Admitting: Emergency Medicine

## 2013-10-11 DIAGNOSIS — B9789 Other viral agents as the cause of diseases classified elsewhere: Secondary | ICD-10-CM

## 2013-10-11 MED ORDER — BUDESONIDE 32 MCG/ACT NA SUSP: 2.0000 | Freq: Every day | NASAL | Status: DC

## 2013-10-11 NOTE — ED Notes (Signed)
Rx x 1 given for rhinocort

## 2013-10-11 NOTE — ED Notes (Addendum)
C/o ears stopped up and sinus pressure x 1 day  Head and chest congestion,  Denies cough   Denies pain

## 2013-10-11 NOTE — ED Provider Notes (Signed)
CSN: [id number]     Arrival date & time 10/10/13  2237 History   First MD Initiated Contact with Patient 10/11/13 0149     Chief Complaint  Patient presents with  . Earache    (Consider location/radiation/quality/duration/timing/severity/associated sxs/prior Treatment) HPI This is a 42 year old male with about a three-day history of sinus congestion and pressure. He states he woke yesterday morning with the sensation that both ureters were stopped up. He compared it to waking up under water. He is taken Sudafed with partial relief but is requesting a refill of his Rhinocort. He has had some rapid pounding heartbeat that occurred before he started taking Sudafed. Is not aware of having a fever. He denies cough, shortness of breath, nausea, vomiting, diarrhea or abdominal pain.  Past Medical History  Diagnosis Date  . Hypertension   . GERD (gastroesophageal reflux disease)   . Bronchitis   . Pleurisy    Past Surgical History  Procedure Laterality Date  . Tonsillectomy     No family history on file. History  Substance Use Topics  . Smoking status: Current Every Day Smoker -- 0.50 packs/day    Types: Cigarettes  . Smokeless tobacco: Former Neurosurgeon    Quit date: 07/15/2012  . Alcohol Use: Yes    Review of Systems  All other systems reviewed and are negative.    Allergies  Review of patient's allergies indicates no known allergies.  Home Medications   Current Outpatient Rx  Name  Route  Sig  Dispense  Refill  . bimatoprost (LUMIGAN) 0.03 % ophthalmic solution      1 drop at bedtime.         . OMEPRAZOLE PO   Oral   Take by mouth.         . Pseudoephedrine-Acetaminophen (SUDAFED SINUS PO)   Oral   Take by mouth.         Marland Kitchen amLODipine (NORVASC) 5 MG tablet   Oral   Take 5 mg by mouth daily.         Marland Kitchen amLODipine (NORVASC) 5 MG tablet   Oral   Take 1 tablet (5 mg total) by mouth daily.   30 tablet   1   . amoxicillin (AMOXIL) 500 MG capsule   Oral   Take  1 capsule (500 mg total) by mouth 3 (three) times daily.   21 capsule   0   . esomeprazole (NEXIUM) 20 MG capsule   Oral   Take 20 mg by mouth daily before breakfast.         . esomeprazole (NEXIUM) 20 MG capsule   Oral   Take 20 mg by mouth daily before breakfast.         . guaiFENesin-codeine 100-10 MG/5ML syrup   Oral   Take 5 mL by mouth 3 (three) times daily as needed for cough.   120 mL   0   . EXPIRED: loratadine (CLARITIN) 10 MG tablet   Oral   Take 1 tablet (10 mg total) by mouth daily.   7 tablet   0   . sulfamethoxazole-trimethoprim (SEPTRA DS) 800-160 MG per tablet   Oral   Take 1 tablet by mouth 2 (two) times daily.   20 tablet   0    BP 141/88  Pulse 105  Temp(Src) 98.5 F (36.9 C) (Oral)  Resp 14  Ht 5\' 11"  (1.803 m)  Wt 236 lb (107.049 kg)  BMI 32.93 kg/m2  SpO2 96%  Physical Exam General: Well-developed,  well-nourished male in no acute distress; appearance consistent with age of record HENT: normocephalic; atraumatic; TMs normal; maxillary sinuses tender to percussion Eyes: pupils equal, round and reactive to light; extraocular muscles intact Neck: supple Heart: regular rate and rhythm Lungs: clear to auscultation bilaterally Abdomen: soft; nondistended; nontender; no masses or hepatosplenomegaly; bowel sounds present Extremities: No deformity; full range of motion; pulses normal Neurologic: Awake, alert and oriented; motor function intact in all extremities and symmetric; no facial droop Skin: Warm and dry Psychiatric: Normal mood and affect    ED Course  Procedures (including critical care time)   MDM  Patient was advised that over-the-counter cough and cold medications may cause tachycardia. We will refill his Rhinocort. At this point his symptoms are most likely viral.    Hanley Seamen, MD 10/11/13 [phone]

## 2013-11-04 ENCOUNTER — Encounter (HOSPITAL_BASED_OUTPATIENT_CLINIC_OR_DEPARTMENT_OTHER): Payer: Self-pay | Admitting: Emergency Medicine

## 2013-11-04 ENCOUNTER — Emergency Department (HOSPITAL_BASED_OUTPATIENT_CLINIC_OR_DEPARTMENT_OTHER)
Admission: EM | Admit: 2013-11-04 | Discharge: 2013-11-04 | Disposition: A | Discharge status: Home / Self Care | Payer: Self-pay | Attending: Emergency Medicine | Admitting: Emergency Medicine

## 2013-11-04 DIAGNOSIS — F17200 Nicotine dependence, unspecified, uncomplicated: Secondary | ICD-10-CM | POA: Insufficient documentation

## 2013-11-04 DIAGNOSIS — M25519 Pain in unspecified shoulder: Secondary | ICD-10-CM | POA: Insufficient documentation

## 2013-11-04 DIAGNOSIS — Z79899 Other long term (current) drug therapy: Secondary | ICD-10-CM | POA: Insufficient documentation

## 2013-11-04 DIAGNOSIS — Z8709 Personal history of other diseases of the respiratory system: Secondary | ICD-10-CM | POA: Insufficient documentation

## 2013-11-04 DIAGNOSIS — K219 Gastro-esophageal reflux disease without esophagitis: Secondary | ICD-10-CM | POA: Insufficient documentation

## 2013-11-04 DIAGNOSIS — I10 Essential (primary) hypertension: Secondary | ICD-10-CM | POA: Insufficient documentation

## 2013-11-04 MED ORDER — IBUPROFEN 600 MG PO TABS: 600.0000 mg | ORAL_TABLET | Freq: Four times a day (QID) | ORAL | Status: DC | PRN

## 2013-11-04 MED ORDER — KETOROLAC TROMETHAMINE 30 MG/ML IJ SOLN
30.0000 mg | Freq: Once | INTRAMUSCULAR | Status: AC
  Administered 2013-11-04: 30 mg via INTRAMUSCULAR
  Filled 2013-11-04: qty 1

## 2013-11-04 MED ORDER — CYCLOBENZAPRINE HCL 10 MG PO TABS: 10.0000 mg | ORAL_TABLET | Freq: Two times a day (BID) | ORAL | Status: DC | PRN

## 2013-11-04 NOTE — Discharge Instructions (Signed)
   Shoulder Pain The shoulder is the joint that connects your arms to your body. The bones that form the shoulder joint include the upper arm bone (humerus), the shoulder blade (scapula), and the collarbone (clavicle). The top of the humerus is shaped like a ball and fits into a rather flat socket on the scapula (glenoid cavity). A combination of muscles and strong, fibrous tissues that connect muscles to bones (tendons) support your shoulder joint and hold the ball in the socket. Small, fluid-filled sacs (bursae) are located in different areas of the joint. They act as cushions between the bones and the overlying soft tissues and help reduce friction between the gliding tendons and the bone as you move your arm. Your shoulder joint allows a wide range of motion in your arm. This range of motion allows you to do things like scratch your back or throw a ball. However, this range of motion also makes your shoulder more prone to pain from overuse and injury. Causes of shoulder pain can originate from both injury and overuse and usually can be grouped in the following four categories:  Redness, swelling, and pain (inflammation) of the tendon (tendinitis) or the bursae (bursitis).  Instability, such as a dislocation of the joint.  Inflammation of the joint (arthritis).  Broken bone (fracture). HOME CARE INSTRUCTIONS   Apply ice to the sore area.  Put ice in a plastic bag.  Place a towel between your skin and the bag.  Leave the ice on for 15-20 minutes, 03-04 times per day for the first 2 days.  Stop using cold packs if they do not help with the pain.  If you have a shoulder sling or immobilizer, wear it as long as your caregiver instructs. Only remove it to shower or bathe. Move your arm as little as possible, but keep your hand moving to prevent swelling.  Squeeze a soft ball or foam pad as much as possible to help prevent swelling.  Only take over-the-counter or prescription medicines for  pain, discomfort, or fever as directed by your caregiver. SEEK MEDICAL CARE IF:   Your shoulder pain increases, or new pain develops in your arm, hand, or fingers.  Your hand or fingers become cold and numb.  Your pain is not relieved with medicines. SEEK IMMEDIATE MEDICAL CARE IF:   Your arm, hand, or fingers are numb or tingling.  Your arm, hand, or fingers are significantly swollen or turn white or blue. MAKE SURE YOU:   Understand these instructions.  Will watch your condition.  Will get help right away if you are not doing well or get worse. Document Released: 07/09/2005 Document Revised: 06/23/2012 Document Reviewed: 09/13/2011 [employer] Patient Information 2014 [employer].  

## 2013-11-04 NOTE — ED Notes (Signed)
Pt reports (R) shoulder blade pain x 2 days.  Hx of same for years.

## 2013-11-07 NOTE — ED Provider Notes (Signed)
CSN: [id number]     Arrival date & time 11/04/13  1434 History   First MD Initiated Contact with Patient 11/04/13 1455     Chief Complaint  Patient presents with  . Shoulder Pain   (Consider location/radiation/quality/duration/timing/severity/associated sxs/prior Treatment) HPI  Patient presents with a two-day history of worsening right shoulder pain. Patient has had a history of right shoulder injury and has had waxing and waning pain for several years. He states that when his shoulder "flares" he usually takes Flexeril which relieves the pain. He is currently out. He denies any injury.  Patient states his pain is consistent with pain he has had the past. Has taken ibuprofen at home with some relief. Rates his pain at 4/10. It is nonradiating.  Past Medical History  Diagnosis Date  . Hypertension   . GERD (gastroesophageal reflux disease)   . Bronchitis   . Pleurisy    Past Surgical History  Procedure Laterality Date  . Tonsillectomy     History reviewed. No pertinent family history. History  Substance Use Topics  . Smoking status: Current Every Day Smoker -- 0.50 packs/day    Types: Cigarettes  . Smokeless tobacco: Former NeurosurgeonUser    Quit date: 07/15/2012  . Alcohol Use: Yes    Review of Systems  Respiratory: Negative for chest tightness and shortness of breath.   Cardiovascular: Negative for chest pain.  Musculoskeletal:       Shoulder pain  All other systems reviewed and are negative.    Allergies  Review of patient's allergies indicates no known allergies.  Home Medications   Current Outpatient Rx  Name  Route  Sig  Dispense  Refill  . amLODipine (NORVASC) 5 MG tablet   Oral   Take 5 mg by mouth daily.         Marland Kitchen. amLODipine (NORVASC) 5 MG tablet   Oral   Take 1 tablet (5 mg total) by mouth daily.   30 tablet   1   . bimatoprost (LUMIGAN) 0.03 % ophthalmic solution      1 drop at bedtime.         . budesonide (RHINOCORT AQUA) 32 MCG/ACT nasal spray  Each Nare   Place 2 sprays into both nostrils daily.   1 Bottle   0   . cyclobenzaprine (FLEXERIL) 10 MG tablet   Oral   Take 1 tablet (10 mg total) by mouth 2 (two) times daily as needed for muscle spasms.   10 tablet   0   . ibuprofen (ADVIL,MOTRIN) 600 MG tablet   Oral   Take 1 tablet (600 mg total) by mouth every 6 (six) hours as needed.   30 tablet   0   . EXPIRED: loratadine (CLARITIN) 10 MG tablet   Oral   Take 1 tablet (10 mg total) by mouth daily.   7 tablet   0   . OMEPRAZOLE PO   Oral   Take by mouth.         . Pseudoephedrine-Acetaminophen (SUDAFED SINUS PO)   Oral   Take by mouth.          BP 119/84  Pulse 83  Temp(Src) 98.6 F (37 C) (Oral)  Resp 16  Ht 5\' 11"  (1.803 m)  Wt 230 lb (104.327 kg)  BMI 32.09 kg/m2  SpO2 99% Physical Exam  Nursing note and vitals reviewed. Constitutional: He is oriented to person, place, and time. He appears well-developed and well-nourished.  HENT:  Head: Normocephalic and atraumatic.  Cardiovascular: Normal rate, regular rhythm and normal heart sounds.   No murmur heard. Pulmonary/Chest: Effort normal and breath sounds normal. No respiratory distress. He has no wheezes.  Abdominal: Soft.  Musculoskeletal: Normal range of motion. He exhibits no edema.  Normal range of motion of bilateral shoulders, equal grip strength, 5 out of 5 deltoid strength, no significant tenderness to palpation or deformity noted  Lymphadenopathy:    He has no cervical adenopathy.  Neurological: He is alert and oriented to person, place, and time.  Skin: Skin is warm and dry.  Psychiatric: He has a normal mood and affect.    ED Course  Procedures (including critical care time) Labs Review Labs Reviewed - No data to display Imaging Review No results found.  EKG Interpretation   None       MDM   1. Shoulder pain    Patient presents for shoulder pain. Symptoms are chronic in nature and physical exam is reassuring. At this  time given no new injury, will not obtain plain films. Patient is driving. I encouraged him to use ibuprofen to 6 hours for the next several days for anti-inflammatory effect. Patient will be given a short course of Flexeril for his pain. Patient stated understanding.  After history, exam, and medical workup I feel the patient has been appropriately medically screened and is safe for discharge home. Pertinent diagnoses were discussed with the patient. Patient was given return precautions.     Shon Baton, MD 11/07/13 [phone]

## 2014-02-11 ENCOUNTER — Encounter (HOSPITAL_BASED_OUTPATIENT_CLINIC_OR_DEPARTMENT_OTHER): Payer: Self-pay | Admitting: Emergency Medicine

## 2014-02-11 ENCOUNTER — Emergency Department (HOSPITAL_BASED_OUTPATIENT_CLINIC_OR_DEPARTMENT_OTHER)
Admission: EM | Admit: 2014-02-11 | Discharge: 2014-02-11 | Disposition: A | Discharge status: Home / Self Care | Payer: Self-pay | Attending: Emergency Medicine | Admitting: Emergency Medicine

## 2014-02-11 DIAGNOSIS — K219 Gastro-esophageal reflux disease without esophagitis: Secondary | ICD-10-CM | POA: Insufficient documentation

## 2014-02-11 DIAGNOSIS — Z8709 Personal history of other diseases of the respiratory system: Secondary | ICD-10-CM | POA: Insufficient documentation

## 2014-02-11 DIAGNOSIS — F17200 Nicotine dependence, unspecified, uncomplicated: Secondary | ICD-10-CM | POA: Insufficient documentation

## 2014-02-11 DIAGNOSIS — I10 Essential (primary) hypertension: Secondary | ICD-10-CM | POA: Insufficient documentation

## 2014-02-11 DIAGNOSIS — Z79899 Other long term (current) drug therapy: Secondary | ICD-10-CM | POA: Insufficient documentation

## 2014-02-11 DIAGNOSIS — M62838 Other muscle spasm: Secondary | ICD-10-CM | POA: Insufficient documentation

## 2014-02-11 DIAGNOSIS — IMO0002 Reserved for concepts with insufficient information to code with codable children: Secondary | ICD-10-CM | POA: Insufficient documentation

## 2014-02-11 DIAGNOSIS — Z760 Encounter for issue of repeat prescription: Secondary | ICD-10-CM | POA: Insufficient documentation

## 2014-02-11 MED ORDER — PREDNISONE 20 MG PO TABS: ORAL_TABLET | ORAL | Status: DC

## 2014-02-11 MED ORDER — KETOROLAC TROMETHAMINE 60 MG/2ML IM SOLN
60.0000 mg | Freq: Once | INTRAMUSCULAR | Status: AC
  Administered 2014-02-11: 60 mg via INTRAMUSCULAR
  Filled 2014-02-11: qty 2

## 2014-02-11 MED ORDER — MELOXICAM 7.5 MG PO TABS: 7.5000 mg | ORAL_TABLET | Freq: Every day | ORAL | Status: DC

## 2014-02-11 MED ORDER — METHOCARBAMOL 500 MG PO TABS: 500.0000 mg | ORAL_TABLET | Freq: Two times a day (BID) | ORAL | Status: DC

## 2014-02-11 MED ORDER — METHOCARBAMOL 500 MG PO TABS
1000.0000 mg | ORAL_TABLET | Freq: Once | ORAL | Status: AC
  Administered 2014-02-11: 1000 mg via ORAL
  Filled 2014-02-11: qty 2

## 2014-02-11 MED ORDER — AMLODIPINE BESYLATE 5 MG PO TABS: 10.0000 mg | ORAL_TABLET | Freq: Every day | ORAL | Status: DC

## 2014-02-11 NOTE — ED Provider Notes (Addendum)
CSN: [id number]     Arrival date & time 02/11/14  0157 History   First MD Initiated Contact with Patient 02/11/14 0220     Chief Complaint  Patient presents with  . Back Pain     (Consider location/radiation/quality/duration/timing/severity/associated sxs/prior Treatment) Patient is a 43 y.o. male presenting with back pain. The history is provided by the patient.  Back Pain Location:  Thoracic spine Quality:  Cramping Radiates to:  Does not radiate Pain severity:  Moderate Pain is:  Same all the time Onset quality:  Gradual Duration:  3 days (after washing and waxing his car on Tuesday) Timing:  Constant Progression:  Unchanged Chronicity:  Recurrent Context: not MVA   Relieved by:  Nothing Worsened by:  Movement Ineffective treatments:  None tried Associated symptoms: no abdominal pain, no abdominal swelling, no bladder incontinence, no bowel incontinence, no chest pain, no dysuria, no fever, no headaches, no leg pain, no numbness, no pelvic pain, no perianal numbness, no tingling, no weakness and no weight loss   Associated symptoms comment:  No SOB no DOE no CP, no long car trips or plane trips Risk factors: no vascular disease   Also wants his BP meds refilled.  Denies DOE SOB denies cough hemoptysis leg pain leg swelling.  No bowel or bladder symptoms.  Had flexeril left from last episode of same  Past Medical History  Diagnosis Date  . Hypertension   . GERD (gastroesophageal reflux disease)   . Bronchitis   . Pleurisy    Past Surgical History  Procedure Laterality Date  . Tonsillectomy     No family history on file. History  Substance Use Topics  . Smoking status: Current Every Day Smoker -- 0.50 packs/day    Types: Cigarettes  . Smokeless tobacco: Former NeurosurgeonUser    Quit date: 07/15/2012  . Alcohol Use: Yes    Review of Systems  Constitutional: Negative for fever and weight loss.  Respiratory: Negative for cough, chest tightness and shortness of breath.    Cardiovascular: Negative for chest pain, palpitations and leg swelling.  Gastrointestinal: Negative for abdominal pain and bowel incontinence.  Genitourinary: Negative for bladder incontinence, dysuria and pelvic pain.  Musculoskeletal: Positive for back pain. Negative for neck pain and neck stiffness.  Skin: Negative for rash.  Neurological: Negative for tingling, weakness, numbness and headaches.  All other systems reviewed and are negative.    PERc negative wells 0, no car trips or plane trips Allergies  Review of patient's allergies indicates no known allergies.  Home Medications   Prior to Admission medications   Medication Sig Start Date End Date Taking? Authorizing Provider  amLODipine (NORVASC) 5 MG tablet Take 1 tablet (5 mg total) by mouth daily. 07/21/12  Yes Geoffery Lyonsouglas Delo, MD  bimatoprost (LUMIGAN) 0.03 % ophthalmic solution 1 drop at bedtime.   Yes Historical Provider, MD  cyclobenzaprine (FLEXERIL) 10 MG tablet Take 1 tablet (10 mg total) by mouth 2 (two) times daily as needed for muscle spasms. 11/04/13  Yes Shon Batonourtney F Horton, MD  ibuprofen (ADVIL,MOTRIN) 600 MG tablet Take 1 tablet (600 mg total) by mouth every 6 (six) hours as needed. 11/04/13  Yes Shon Batonourtney F Horton, MD  loratadine (CLARITIN) 10 MG tablet Take 1 tablet (10 mg total) by mouth daily. 03/05/12 02/11/14 Yes Jobani Sabado K Kasie Leccese-Rasch, MD  OMEPRAZOLE PO Take by mouth.   Yes Historical Provider, MD  amLODipine (NORVASC) 5 MG tablet Take 5 mg by mouth daily.    Historical Provider, MD  amLODipine (NORVASC) 5 MG tablet Take 2 tablets (10 mg total) by mouth daily. 02/11/14   Krystol Rocco K Tamorah Hada-Rasch, MD  budesonide (RHINOCORT AQUA) 32 MCG/ACT nasal spray Place 2 sprays into both nostrils daily. 10/11/13   John L Molpus, MD  meloxicam (MOBIC) 7.5 MG tablet Take 1 tablet (7.5 mg total) by mouth daily. 02/11/14   Gottlieb Zuercher K Seda Kronberg-Rasch, MD  methocarbamol (ROBAXIN) 500 MG tablet Take 1 tablet (500 mg total) by mouth 2 (two) times  daily. 02/11/14   Jataya Wann K Maanvi Lecompte-Rasch, MD  predniSONE (DELTASONE) 20 MG tablet 3 tabs po day one, then 2 po daily x 4 days 02/11/14   Jevonte Clanton K Yuvin Bussiere-Rasch, MD  Pseudoephedrine-Acetaminophen (SUDAFED SINUS PO) Take by mouth.    Historical Provider, MD   BP 146/91  Pulse 106  Temp(Src) 98.3 F (36.8 C) (Oral)  Resp 22  Ht 5\' 11"  (1.803 m)  Wt 236 lb (107.049 kg)  BMI 32.93 kg/m2  SpO2 98% Physical Exam  Constitutional: He is oriented to person, place, and time. He appears well-developed and well-nourished.  HENT:  Head: Normocephalic and atraumatic.  Eyes: Conjunctivae are normal. Pupils are equal, round, and reactive to light.  Neck: Normal range of motion. Neck supple.  Cardiovascular: Normal rate, regular rhythm and intact distal pulses.   Pulmonary/Chest: Effort normal and breath sounds normal. He has no wheezes. He has no rales.  Abdominal: Soft. Bowel sounds are normal. There is no tenderness. There is no rebound and no guarding.  Musculoskeletal: Normal range of motion. He exhibits no edema and no tenderness.  Palpable spasm in the paraspinous muscle no step offs or crepitance or point tenderness of the c t or l spine  Neurological: He is alert and oriented to person, place, and time.  Skin: Skin is warm and dry.  Psychiatric: He has a normal mood and affect.    ED Course  Procedures (including critical care time) Labs Review Labs Reviewed - No data to display  Imaging Review No results found.   EKG Interpretation None      MDM   Final diagnoses:  Muscle spasm  Medication refill   Back pain recurrent.  There is no weakness nor numbness it is worse with movement, similar to previous episodes.  No trauma.  No red flags.  I highly doubt bony injury.  No pulmonary or cardiac complaints there is no indication for labs or imaging at this time.  Return for weakness numbness changes in bowel or bladder habits DOE SOB cough, hemoptysis leg pain or swelling or any  concerns Wants refill on his Norvasc    Sheniya Garciaperez K Mida Cory-Rasch, MD 02/11/14 0226  [name] K Danella Philson-Rasch, MD 02/11/14 0229  Jasmine AweApril K Aleza Pew-Rasch, MD 02/11/14 [phone]

## 2014-02-11 NOTE — ED Notes (Signed)
Pt c/o rt upper back pain x3days, now radiating up to neck; pain worse to take a deep breath, states unknown injury, hx of same years ago, states has been taken flexeril 10mg  and Ibuprofen 800mg  x3days with no relief

## 2014-02-11 NOTE — ED Notes (Signed)
Rt upper back pain x 3 days  Increased w movement and inspiration,  Denies recent injury

## 2014-02-11 NOTE — Discharge Instructions (Signed)
Heat Therapy  Heat therapy can help make painful, stiff muscles and joints feel better. Do not use heat on new injuries. Wait at least 48 hours after an injury to use heat. Do not use heat when you have aches or pains right after an activity. If you still have pain 3 hours after stopping the activity, then you may use heat.  HOME CARE  Wet heat pack   Soak a clean towel in warm water. Squeeze out the extra water.   Put the warm, wet towel in a plastic bag.   Place a thin, dry towel between your skin and the bag.   Put the heat pack on the area for 5 minutes, and check your skin. Your skin may be pink, but it should not be red.   Leave the heat pack on the area for 15 to 30 minutes.   Repeat this every 2 to 4 hours while awake. Do not use heat while you are sleeping.  Warm water bath   Fill a tub with warm water.   Place the affected body part in the tub.   Soak the area for 20 to 40 minutes.   Repeat as needed.  Hot water bottle   Fill the water bottle half full with hot water.   Press out the extra air. Close the cap tightly.   Place a dry towel between your skin and the bottle.   Put the bottle on the area for 5 minutes, and check your skin. Your skin may be pink, but it should not be red.   Leave the bottle on the area for 15 to 30 minutes.   Repeat this every 2 to 4 hours while awake.  Electric heating pad   Place a dry towel between your skin and the heating pad.   Set the heating pad on low heat.   Put the heating pad on the area for 10 minutes, and check your skin. Your skin may be pink, but it should not be red.   Leave the heating pad on the area for 20 to 40 minutes.   Repeat this every 2 to 4 hours while awake.   Do not lie on the heating pad.   Do not fall asleep while using the heating pad.   Do not use the heating pad near water.  GET HELP RIGHT AWAY IF:   You get blisters or red skin.   Your skin is puffy (swollen), or you lose feeling (numbness) in the affected area.    You have any new problems.   Your problems are getting worse.   You have any questions or concerns.  If you have any problems, stop using heat therapy until you see your doctor.  MAKE SURE YOU:   Understand these instructions.   Will watch your condition.   Will get help right away if you are not doing well or get worse.  Document Released: 12/22/2011 Document Reviewed: 12/22/2011  [employer] Patient Information 2014 [employer].

## 2014-04-02 ENCOUNTER — Encounter (HOSPITAL_BASED_OUTPATIENT_CLINIC_OR_DEPARTMENT_OTHER): Payer: Self-pay | Admitting: Emergency Medicine

## 2014-04-02 ENCOUNTER — Emergency Department (HOSPITAL_BASED_OUTPATIENT_CLINIC_OR_DEPARTMENT_OTHER)
Admission: EM | Admit: 2014-04-02 | Discharge: 2014-04-03 | Disposition: A | Discharge status: Home / Self Care | Payer: Self-pay | Attending: Emergency Medicine | Admitting: Emergency Medicine

## 2014-04-02 DIAGNOSIS — I10 Essential (primary) hypertension: Secondary | ICD-10-CM | POA: Insufficient documentation

## 2014-04-02 DIAGNOSIS — L03319 Cellulitis of trunk, unspecified: Secondary | ICD-10-CM

## 2014-04-02 DIAGNOSIS — Z8709 Personal history of other diseases of the respiratory system: Secondary | ICD-10-CM | POA: Insufficient documentation

## 2014-04-02 DIAGNOSIS — L03119 Cellulitis of unspecified part of limb: Principal | ICD-10-CM

## 2014-04-02 DIAGNOSIS — L02416 Cutaneous abscess of left lower limb: Secondary | ICD-10-CM

## 2014-04-02 DIAGNOSIS — L02214 Cutaneous abscess of groin: Secondary | ICD-10-CM

## 2014-04-02 DIAGNOSIS — Z791 Long term (current) use of non-steroidal anti-inflammatories (NSAID): Secondary | ICD-10-CM | POA: Insufficient documentation

## 2014-04-02 DIAGNOSIS — Z79899 Other long term (current) drug therapy: Secondary | ICD-10-CM | POA: Insufficient documentation

## 2014-04-02 DIAGNOSIS — IMO0002 Reserved for concepts with insufficient information to code with codable children: Secondary | ICD-10-CM | POA: Insufficient documentation

## 2014-04-02 DIAGNOSIS — L02219 Cutaneous abscess of trunk, unspecified: Secondary | ICD-10-CM | POA: Insufficient documentation

## 2014-04-02 DIAGNOSIS — L02419 Cutaneous abscess of limb, unspecified: Secondary | ICD-10-CM | POA: Insufficient documentation

## 2014-04-02 DIAGNOSIS — K219 Gastro-esophageal reflux disease without esophagitis: Secondary | ICD-10-CM | POA: Insufficient documentation

## 2014-04-02 DIAGNOSIS — F17200 Nicotine dependence, unspecified, uncomplicated: Secondary | ICD-10-CM | POA: Insufficient documentation

## 2014-04-02 MED ORDER — DOXYCYCLINE HYCLATE 100 MG PO CAPS: 100.0000 mg | ORAL_CAPSULE | Freq: Two times a day (BID) | ORAL | Status: DC

## 2014-04-02 MED ORDER — DOXYCYCLINE HYCLATE 100 MG PO TABS
100.0000 mg | ORAL_TABLET | Freq: Once | ORAL | Status: AC
  Administered 2014-04-03: 100 mg via ORAL
  Filled 2014-04-02: qty 1

## 2014-04-02 NOTE — Discharge Instructions (Signed)

## 2014-04-02 NOTE — ED Provider Notes (Signed)
CSN: [id number]     Arrival date & time 04/02/14  2220 History  This chart was scribed for Aaron SeamenJohn L Derrico Zhong, MD by Aaron Weber, ED Scribe. This patient was seen in room MH10/MH10 and patient care was started at 11:51 PM.     Chief Complaint  Patient presents with  . Abscess   The history is provided by the patient. No language interpreter was used.    HPI Comments: Aaron Weber is a 43 y.o. male who presents to the Emergency Department complaining of two boils on left inner thigh onset a week ago. Patient reports mild pain when walking, but states that the boils do not otherwise hurt. He said that one of the boils was draining and that the other came to a head but is now "flattening out." Patient denies associated fever, chills, nausea or vomiting.   Past Medical History  Diagnosis Date  . Hypertension   . GERD (gastroesophageal reflux disease)   . Pleurisy    Past Surgical History  Procedure Laterality Date  . Tonsillectomy     No family history on file. History  Substance Use Topics  . Smoking status: Current Every Day Smoker -- 0.50 packs/day    Types: Cigarettes  . Smokeless tobacco: Former NeurosurgeonUser    Quit date: 07/15/2012  . Alcohol Use: Yes    Review of Systems A complete 10 system review of systems was obtained and all systems are negative except as noted in the HPI and PMH.     Allergies  Review of patient's allergies indicates no known allergies.  Home Medications   Prior to Admission medications   Medication Sig Start Date End Date Taking? Authorizing Provider  amLODipine (NORVASC) 5 MG tablet Take 5 mg by mouth daily.    Historical Provider, MD  amLODipine (NORVASC) 5 MG tablet Take 1 tablet (5 mg total) by mouth daily. 07/21/12   Aaron Lyonsouglas Delo, MD  amLODipine (NORVASC) 5 MG tablet Take 2 tablets (10 mg total) by mouth daily. 02/11/14   Aaron K Palumbo-Rasch, MD  bimatoprost (LUMIGAN) 0.03 % ophthalmic solution 1 drop at bedtime.    Historical Provider, MD   budesonide (RHINOCORT AQUA) 32 MCG/ACT nasal spray Place 2 sprays into both nostrils daily. 10/11/13   Aaron BeersJohn L Kahli Mayon, MD  cyclobenzaprine (FLEXERIL) 10 MG tablet Take 1 tablet (10 mg total) by mouth 2 (two) times daily as needed for muscle spasms. 11/04/13   Aaron Batonourtney F Horton, MD  doxycycline (VIBRAMYCIN) 100 MG capsule Take 1 capsule (100 mg total) by mouth 2 (two) times daily. One po bid x 7 days 04/02/14   Aaron BeersJohn L Angelic Schnelle, MD  ibuprofen (ADVIL,MOTRIN) 600 MG tablet Take 1 tablet (600 mg total) by mouth every 6 (six) hours as needed. 11/04/13   Aaron Batonourtney F Horton, MD  loratadine (CLARITIN) 10 MG tablet Take 1 tablet (10 mg total) by mouth daily. 03/05/12 02/11/14  Aaron K Palumbo-Rasch, MD  meloxicam (MOBIC) 7.5 MG tablet Take 1 tablet (7.5 mg total) by mouth daily. 02/11/14   Aaron K Palumbo-Rasch, MD  methocarbamol (ROBAXIN) 500 MG tablet Take 1 tablet (500 mg total) by mouth 2 (two) times daily. 02/11/14   Aaron Smitty CordsK Palumbo-Rasch, MD  OMEPRAZOLE PO Take by mouth.    Historical Provider, MD  predniSONE (DELTASONE) 20 MG tablet 3 tabs po day one, then 2 po daily x 4 days 02/11/14   Aaron K Palumbo-Rasch, MD  Pseudoephedrine-Acetaminophen (SUDAFED SINUS PO) Take by mouth.    Historical Provider, MD  BP 132/87  Pulse 105  Temp(Src) 98.2 F (36.8 C) (Oral)  Resp 20  Ht 5\' 11"  (1.803 m)  Wt 239 lb (108.41 kg)  BMI 33.35 kg/m2  SpO2 97%  Physical Exam  Nursing note and vitals reviewed. General: Well-developed, well-nourished male in no acute distress; appearance consistent with age of record HENT: normocephalic; atraumatic Eyes: pupils equal, round and reactive to light; extraocular muscles intact Neck: supple Heart: regular rate and rhythm Lungs: clear to auscultation bilaterally Abdomen: soft; nondistended; nontender; no masses or hepatosplenomegaly; bowel sounds present Extremities: No deformity; full range of motion; pulses normal Neurologic: Awake, alert and oriented; motor function intact in all  extremities and symmetric; no facial droop Skin: Warm and dry; resolving, minimally tender abscess of left groin fold; early abscess left medial thigh without fluctuance or pointing Psychiatric: Normal mood and affect   ED Course  Procedures (including critical care time) DIAGNOSTIC STUDIES: Oxygen Saturation is 97% on room air, normal by my interpretation.    COORDINATION OF CARE: 11:56 PM: discussed treatment plan which includes antibiotics with pt at bedside and pt agrees.     MDM  I&D of abscesses not indicated at this time. We'll place him on doxycycline. He was advised to return for I&D she is here increased in size, increase in tenderness or start pointing.   Final diagnoses:  Abscess of left thigh  Abscess of left groin   I personally performed the services described in this documentation, which was scribed in my presence. The recorded information has been reviewed and is accurate.   Aaron BeersJohn L Nickey Canedo, MD 04/03/14 0000

## 2014-04-02 NOTE — ED Notes (Signed)
Pt reports abscess to left groin and left leg.  Sts groin has been draining x 1 week.  Sts abscess on leg has been getting bigger.

## 2014-05-25 ENCOUNTER — Emergency Department (HOSPITAL_BASED_OUTPATIENT_CLINIC_OR_DEPARTMENT_OTHER)
Admission: EM | Admit: 2014-05-25 | Discharge: 2014-05-25 | Disposition: A | Discharge status: Home / Self Care | Payer: Self-pay | Attending: Emergency Medicine | Admitting: Emergency Medicine

## 2014-05-25 ENCOUNTER — Encounter (HOSPITAL_BASED_OUTPATIENT_CLINIC_OR_DEPARTMENT_OTHER): Payer: Self-pay | Admitting: Emergency Medicine

## 2014-05-25 DIAGNOSIS — Z8719 Personal history of other diseases of the digestive system: Secondary | ICD-10-CM | POA: Insufficient documentation

## 2014-05-25 DIAGNOSIS — X503XXA Overexertion from repetitive movements, initial encounter: Secondary | ICD-10-CM | POA: Insufficient documentation

## 2014-05-25 DIAGNOSIS — I10 Essential (primary) hypertension: Secondary | ICD-10-CM | POA: Insufficient documentation

## 2014-05-25 DIAGNOSIS — F17200 Nicotine dependence, unspecified, uncomplicated: Secondary | ICD-10-CM | POA: Insufficient documentation

## 2014-05-25 DIAGNOSIS — M79609 Pain in unspecified limb: Secondary | ICD-10-CM | POA: Insufficient documentation

## 2014-05-25 DIAGNOSIS — Z79899 Other long term (current) drug therapy: Secondary | ICD-10-CM | POA: Insufficient documentation

## 2014-05-25 DIAGNOSIS — IMO0002 Reserved for concepts with insufficient information to code with codable children: Secondary | ICD-10-CM | POA: Insufficient documentation

## 2014-05-25 DIAGNOSIS — Z8709 Personal history of other diseases of the respiratory system: Secondary | ICD-10-CM | POA: Insufficient documentation

## 2014-05-25 DIAGNOSIS — Y929 Unspecified place or not applicable: Secondary | ICD-10-CM | POA: Insufficient documentation

## 2014-05-25 DIAGNOSIS — T148XXA Other injury of unspecified body region, initial encounter: Secondary | ICD-10-CM | POA: Insufficient documentation

## 2014-05-25 DIAGNOSIS — Y9389 Activity, other specified: Secondary | ICD-10-CM | POA: Insufficient documentation

## 2014-05-25 MED ORDER — HYDROCODONE-ACETAMINOPHEN 5-325 MG PO TABS: 1.0000 | ORAL_TABLET | Freq: Four times a day (QID) | ORAL | Status: DC | PRN

## 2014-05-25 MED ORDER — KETOROLAC TROMETHAMINE 60 MG/2ML IM SOLN
60.0000 mg | Freq: Once | INTRAMUSCULAR | Status: AC
  Administered 2014-05-25: 60 mg via INTRAMUSCULAR
  Filled 2014-05-25: qty 2

## 2014-05-25 MED ORDER — IBUPROFEN 600 MG PO TABS: 600.0000 mg | ORAL_TABLET | Freq: Four times a day (QID) | ORAL | Status: DC | PRN

## 2014-05-25 NOTE — ED Provider Notes (Signed)
CSN: [id number]     Arrival date & time 05/25/14  1120 History   First MD Initiated Contact with Patient 05/25/14 1129     Chief Complaint  Patient presents with  . Arm Pain     (Consider location/radiation/quality/duration/timing/severity/associated sxs/prior Treatment) HPI  This is a 43 year old male with a history of hypertension, GERD, pleurisy who presents with left arm pain. Patient reports that he lifted weights for the first time on Monday. He states that since that time he has had progressive pain over her the flexor surface of his elbows. He states that he's been taking Flexeril and ibuprofen with minimal relief. He has run out of his Flexeril. He is not using ice. He denies any direct injury. Current pain is 8/10. Pain is made worse with extension at the elbow. He denies any other symptoms.  Past Medical History  Diagnosis Date  . Hypertension   . GERD (gastroesophageal reflux disease)   . Pleurisy    Past Surgical History  Procedure Laterality Date  . Tonsillectomy     No family history on file. History  Substance Use Topics  . Smoking status: Current Every Day Smoker -- 0.50 packs/day    Types: Cigarettes  . Smokeless tobacco: Former NeurosurgeonUser    Quit date: 07/15/2012  . Alcohol Use: Yes    Review of Systems  Constitutional: Negative for fever.  Musculoskeletal:       Left arm pain  Skin: Negative for wound.  All other systems reviewed and are negative.     Allergies  Review of patient's allergies indicates no known allergies.  Home Medications   Prior to Admission medications   Medication Sig Start Date End Date Taking? Authorizing Jenniferann Stuckert  amLODipine (NORVASC) 5 MG tablet Take 5 mg by mouth daily.    Historical Mashanda Ishibashi, MD  amLODipine (NORVASC) 5 MG tablet Take 1 tablet (5 mg total) by mouth daily. 07/21/12   Geoffery Lyonsouglas Delo, MD  amLODipine (NORVASC) 5 MG tablet Take 2 tablets (10 mg total) by mouth daily. 02/11/14   April K Palumbo-Rasch, MD  bimatoprost  (LUMIGAN) 0.03 % ophthalmic solution 1 drop at bedtime.    Historical Mariella Blackwelder, MD  budesonide (RHINOCORT AQUA) 32 MCG/ACT nasal spray Place 2 sprays into both nostrils daily. 10/11/13   Carlisle BeersJohn L Molpus, MD  cyclobenzaprine (FLEXERIL) 10 MG tablet Take 1 tablet (10 mg total) by mouth 2 (two) times daily as needed for muscle spasms. 11/04/13   Shon Batonourtney F Horton, MD  doxycycline (VIBRAMYCIN) 100 MG capsule Take 1 capsule (100 mg total) by mouth 2 (two) times daily. One po bid x 7 days 04/02/14   Hanley SeamenJohn L Molpus, MD  HYDROcodone-acetaminophen (NORCO/VICODIN) 5-325 MG per tablet Take 1 tablet by mouth every 6 (six) hours as needed for moderate pain or severe pain. 05/25/14   Shon Batonourtney F Horton, MD  ibuprofen (ADVIL,MOTRIN) 600 MG tablet Take 1 tablet (600 mg total) by mouth every 6 (six) hours as needed. 11/04/13   Shon Batonourtney F Horton, MD  ibuprofen (ADVIL,MOTRIN) 600 MG tablet Take 1 tablet (600 mg total) by mouth every 6 (six) hours as needed. Limit use to 5 days 05/25/14   Shon Batonourtney F Horton, MD  loratadine (CLARITIN) 10 MG tablet Take 1 tablet (10 mg total) by mouth daily. 03/05/12 02/11/14  April K Palumbo-Rasch, MD  meloxicam (MOBIC) 7.5 MG tablet Take 1 tablet (7.5 mg total) by mouth daily. 02/11/14   April K Palumbo-Rasch, MD  methocarbamol (ROBAXIN) 500 MG tablet Take 1 tablet (500  mg total) by mouth 2 (two) times daily. 02/11/14   April Smitty Cords, MD  OMEPRAZOLE PO Take by mouth.    Historical Keeanna Villafranca, MD  predniSONE (DELTASONE) 20 MG tablet 3 tabs po day one, then 2 po daily x 4 days 02/11/14   April K Palumbo-Rasch, MD  Pseudoephedrine-Acetaminophen (SUDAFED SINUS PO) Take by mouth.    Historical Adryana Mogensen, MD   BP 133/93  Pulse 92  Temp(Src) 98.3 F (36.8 C) (Oral)  Resp 18  Ht 5\' 11"  (1.803 m)  Wt 246 lb (111.585 kg)  BMI 34.33 kg/m2  SpO2 96% Physical Exam  Nursing note and vitals reviewed. Constitutional: He is oriented to person, place, and time. He appears well-developed and well-nourished. No  distress.  HENT:  Head: Normocephalic and atraumatic.  Cardiovascular: Normal rate and regular rhythm.   Pulmonary/Chest: Effort normal. No respiratory distress.  Musculoskeletal: He exhibits no edema.  Left arm: Tenderness to palpation over the insertion of the biceps tendon at the elbow joint, full range of motion however range of motion is limited by pain, no obvious deformity, no bony tenderness, 2+ radial pulse, sensation and strength intact distally  Neurological: He is alert and oriented to person, place, and time.  Skin: Skin is warm and dry.  Psychiatric: He has a normal mood and affect.    ED Course  Procedures (including critical care time) Labs Review Labs Reviewed - No data to display  Imaging Review No results found.   EKG Interpretation None      MDM   Final diagnoses:  Musculoskeletal strain    Patient presents with left arm pain after lifting weights for the first time on Monday. He is nontoxic on exam. There is tenderness to palpation in the insertion of the left biceps tendon at the elbow. No obvious biceps tear or other injury. No bony deformity. Patient I. and Toradol. Low suspicion for fracture and suspect musculoskeletal strain. Will discharge with a short course of Norco. Have encouraged patient to continue ibuprofen as well as rest and ice.  After history, exam, and medical workup I feel the patient has been appropriately medically screened and is safe for discharge home. Pertinent diagnoses were discussed with the patient. Patient was given return precautions.     Shon Baton, MD 05/25/14 [phone]

## 2014-05-25 NOTE — Discharge Instructions (Signed)

## 2014-05-25 NOTE — ED Notes (Signed)
Pt reports that he was lifting weights, since doing so on Monday he has had pain in his left arm and he feels like he cannot straighten his arm

## 2014-05-25 NOTE — ED Notes (Signed)
EDP at patient bedside for assessment/update  

## 2014-08-17 ENCOUNTER — Encounter (HOSPITAL_BASED_OUTPATIENT_CLINIC_OR_DEPARTMENT_OTHER): Payer: Self-pay

## 2014-08-17 ENCOUNTER — Emergency Department (HOSPITAL_BASED_OUTPATIENT_CLINIC_OR_DEPARTMENT_OTHER)
Admission: EM | Admit: 2014-08-17 | Discharge: 2014-08-17 | Disposition: A | Discharge status: Home / Self Care | Payer: Self-pay | Attending: Emergency Medicine | Admitting: Emergency Medicine

## 2014-08-17 DIAGNOSIS — Z791 Long term (current) use of non-steroidal anti-inflammatories (NSAID): Secondary | ICD-10-CM | POA: Insufficient documentation

## 2014-08-17 DIAGNOSIS — L299 Pruritus, unspecified: Secondary | ICD-10-CM | POA: Insufficient documentation

## 2014-08-17 DIAGNOSIS — I10 Essential (primary) hypertension: Secondary | ICD-10-CM | POA: Insufficient documentation

## 2014-08-17 DIAGNOSIS — Z7952 Long term (current) use of systemic steroids: Secondary | ICD-10-CM | POA: Insufficient documentation

## 2014-08-17 DIAGNOSIS — K219 Gastro-esophageal reflux disease without esophagitis: Secondary | ICD-10-CM | POA: Insufficient documentation

## 2014-08-17 DIAGNOSIS — R0789 Other chest pain: Secondary | ICD-10-CM | POA: Insufficient documentation

## 2014-08-17 DIAGNOSIS — R21 Rash and other nonspecific skin eruption: Secondary | ICD-10-CM | POA: Insufficient documentation

## 2014-08-17 DIAGNOSIS — T7840XA Allergy, unspecified, initial encounter: Secondary | ICD-10-CM | POA: Insufficient documentation

## 2014-08-17 DIAGNOSIS — Y929 Unspecified place or not applicable: Secondary | ICD-10-CM | POA: Insufficient documentation

## 2014-08-17 DIAGNOSIS — Z720 Tobacco use: Secondary | ICD-10-CM | POA: Insufficient documentation

## 2014-08-17 DIAGNOSIS — Z79899 Other long term (current) drug therapy: Secondary | ICD-10-CM | POA: Insufficient documentation

## 2014-08-17 DIAGNOSIS — Y939 Activity, unspecified: Secondary | ICD-10-CM | POA: Insufficient documentation

## 2014-08-17 MED ORDER — METHYLPREDNISOLONE SODIUM SUCC 125 MG IJ SOLR
125.0000 mg | Freq: Once | INTRAMUSCULAR | Status: AC
  Administered 2014-08-17: 125 mg via INTRAMUSCULAR
  Filled 2014-08-17: qty 2

## 2014-08-17 MED ORDER — ALBUTEROL SULFATE HFA 108 (90 BASE) MCG/ACT IN AERS: 2.0000 | INHALATION_SPRAY | Freq: Once | RESPIRATORY_TRACT | Status: DC

## 2014-08-17 MED ORDER — ALBUTEROL SULFATE HFA 108 (90 BASE) MCG/ACT IN AERS
2.0000 | INHALATION_SPRAY | RESPIRATORY_TRACT | Status: DC | PRN
  Administered 2014-08-17: 2 via RESPIRATORY_TRACT

## 2014-08-17 MED ORDER — ALBUTEROL SULFATE HFA 108 (90 BASE) MCG/ACT IN AERS
INHALATION_SPRAY | RESPIRATORY_TRACT | Status: AC
  Filled 2014-08-17: qty 6.7

## 2014-08-17 NOTE — Discharge Instructions (Signed)

## 2014-08-17 NOTE — ED Notes (Signed)
Pt reports itching started a week ago but started having welts and hives about 2-3 days ago.  Reports chest tightness.  Breath sounds clear.  No visible swelling to lips, face or tongue.

## 2014-08-17 NOTE — ED Provider Notes (Addendum)
CSN: [id number]     Arrival date & time 08/17/14  0203 History   First MD Initiated Contact with Patient 08/17/14 [phone]     Chief Complaint  Patient presents with  . Allergic Reaction     (Consider location/radiation/quality/duration/timing/severity/associated sxs/prior Treatment) HPI  This is a 43 year old male with a 2 to three-day history of itching and skin lesions which he describes as welts. He recently changed soap and detergent and thinks this may have triggered it. He has also had some chest tightness and cough. He denies throat swelling, nausea, vomiting or diarrhea. Symptoms have been moderate at their worst. Itching and welts been relieved with Benadryl which he took for the first time several hours ago.   Past Medical History  Diagnosis Date  . Hypertension   . GERD (gastroesophageal reflux disease)   . Pleurisy    Past Surgical History  Procedure Laterality Date  . Tonsillectomy     No family history on file. History  Substance Use Topics  . Smoking status: Current Every Day Smoker -- 0.50 packs/day    Types: Cigarettes  . Smokeless tobacco: Former NeurosurgeonUser    Quit date: 07/15/2012  . Alcohol Use: Yes    Review of Systems  All other systems reviewed and are negative.   Allergies  Review of patient's allergies indicates no known allergies.  Home Medications   Prior to Admission medications   Medication Sig Start Date End Date Taking? Authorizing Provider  amLODipine (NORVASC) 5 MG tablet Take 5 mg by mouth daily.    Historical Provider, MD  amLODipine (NORVASC) 5 MG tablet Take 1 tablet (5 mg total) by mouth daily. 07/21/12   Geoffery Lyonsouglas Delo, MD  amLODipine (NORVASC) 5 MG tablet Take 2 tablets (10 mg total) by mouth daily. 02/11/14   April K Palumbo-Rasch, MD  bimatoprost (LUMIGAN) 0.03 % ophthalmic solution 1 drop at bedtime.    Historical Provider, MD  budesonide (RHINOCORT AQUA) 32 MCG/ACT nasal spray Place 2 sprays into both nostrils daily. 10/11/13   Carlisle BeersJohn L  Marteze Vecchio, MD  cyclobenzaprine (FLEXERIL) 10 MG tablet Take 1 tablet (10 mg total) by mouth 2 (two) times daily as needed for muscle spasms. 11/04/13   Shon Batonourtney F Horton, MD  doxycycline (VIBRAMYCIN) 100 MG capsule Take 1 capsule (100 mg total) by mouth 2 (two) times daily. One po bid x 7 days 04/02/14   Hanley SeamenJohn L Lavaughn Bisig, MD  HYDROcodone-acetaminophen (NORCO/VICODIN) 5-325 MG per tablet Take 1 tablet by mouth every 6 (six) hours as needed for moderate pain or severe pain. 05/25/14   Shon Batonourtney F Horton, MD  ibuprofen (ADVIL,MOTRIN) 600 MG tablet Take 1 tablet (600 mg total) by mouth every 6 (six) hours as needed. 11/04/13   Shon Batonourtney F Horton, MD  ibuprofen (ADVIL,MOTRIN) 600 MG tablet Take 1 tablet (600 mg total) by mouth every 6 (six) hours as needed. Limit use to 5 days 05/25/14   Shon Batonourtney F Horton, MD  loratadine (CLARITIN) 10 MG tablet Take 1 tablet (10 mg total) by mouth daily. 03/05/12 02/11/14  April K Palumbo-Rasch, MD  meloxicam (MOBIC) 7.5 MG tablet Take 1 tablet (7.5 mg total) by mouth daily. 02/11/14   April K Palumbo-Rasch, MD  methocarbamol (ROBAXIN) 500 MG tablet Take 1 tablet (500 mg total) by mouth 2 (two) times daily. 02/11/14   April Smitty CordsK Palumbo-Rasch, MD  OMEPRAZOLE PO Take by mouth.    Historical Provider, MD  predniSONE (DELTASONE) 20 MG tablet 3 tabs po day one, then 2 po daily x  4 days 02/11/14   April K Palumbo-Rasch, MD  Pseudoephedrine-Acetaminophen (SUDAFED SINUS PO) Take by mouth.    Historical Provider, MD   Ht 5\' 11"  (1.803 m)  Wt 242 lb (109.77 kg)  BMI 33.77 kg/m2   Physical Exam  General: Well-developed, well-nourished male in no acute distress; appearance consistent with age of record HENT: normocephalic; atraumatic; pharynx normal; no dysphonia Eyes: pupils equal, round and reactive to light; extraocular muscles intact Neck: supple Heart: regular rate and rhythm Lungs: clear to auscultation bilaterally;  Abdomen: soft; nondistended Extremities: No deformity; full range of motion;  pulses normal Neurologic: Awake, alert and oriented; motor function intact in all extremities and symmetric; no facial droop Skin: Warm and dry; a few scattered welts Psychiatric: Normal mood and affect    ED Course  Procedures (including critical care time)    MDM    EKG Interpretation  Date/Time:  Thursday August 17 2014 02:12:04 EST Ventricular Rate:  99 PR Interval:  156 QRS Duration: 94 QT Interval:  332 QTC Calculation: 426 R Axis:   31 Text Interpretation:  Normal sinus rhythm Normal ECG No significant change was found Confirmed by Athalie Newhard  MD, Jonny RuizJOHN ([id number]) on 08/17/2014 2:15:38 AM       Patient was advised to switch back to his original soap and detergent. He was advised to continue Benadryl as needed for itching and hives. We'll give him a shot of steroids in the ED. We'll also provide him with an inhaler for his shortness of breath and cough.  Hanley SeamenJohn L Adalis Gatti, MD 08/17/14 [phone]  Hanley SeamenJohn L Leib Elahi, MD 08/17/14 [phone]

## 2015-02-15 ENCOUNTER — Emergency Department (HOSPITAL_BASED_OUTPATIENT_CLINIC_OR_DEPARTMENT_OTHER)
Admission: EM | Admit: 2015-02-15 | Discharge: 2015-02-16 | Disposition: A | Discharge status: Home / Self Care | Payer: Self-pay | Attending: Emergency Medicine | Admitting: Emergency Medicine

## 2015-02-15 ENCOUNTER — Encounter (HOSPITAL_BASED_OUTPATIENT_CLINIC_OR_DEPARTMENT_OTHER): Payer: Self-pay | Admitting: Emergency Medicine

## 2015-02-15 DIAGNOSIS — Z720 Tobacco use: Secondary | ICD-10-CM | POA: Insufficient documentation

## 2015-02-15 DIAGNOSIS — Z79899 Other long term (current) drug therapy: Secondary | ICD-10-CM | POA: Insufficient documentation

## 2015-02-15 DIAGNOSIS — K219 Gastro-esophageal reflux disease without esophagitis: Secondary | ICD-10-CM | POA: Insufficient documentation

## 2015-02-15 DIAGNOSIS — I10 Essential (primary) hypertension: Secondary | ICD-10-CM | POA: Insufficient documentation

## 2015-02-15 DIAGNOSIS — R140 Abdominal distension (gaseous): Secondary | ICD-10-CM | POA: Insufficient documentation

## 2015-02-15 NOTE — ED Notes (Signed)
Pt in c/o bloated feeling, like gas x 4 days. Denies pain. States he took mag citrate and felt better, but sx returned after eating.

## 2015-02-16 ENCOUNTER — Emergency Department (HOSPITAL_BASED_OUTPATIENT_CLINIC_OR_DEPARTMENT_OTHER): Payer: Self-pay

## 2015-02-16 LAB — COMPREHENSIVE METABOLIC PANEL
ALT: 23 U/L (ref 17–63)
ANION GAP: 9 (ref 5–15)
AST: 22 U/L (ref 15–41)
Albumin: 4.5 g/dL (ref 3.5–5.0)
Alkaline Phosphatase: 100 U/L (ref 38–126)
BUN: 11 mg/dL (ref 6–20)
CO2: 26 mmol/L (ref 22–32)
Calcium: 9.6 mg/dL (ref 8.9–10.3)
Chloride: 104 mmol/L (ref 101–111)
Creatinine, Ser: 0.98 mg/dL (ref 0.61–1.24)
GLUCOSE: 114 mg/dL — AB (ref 70–99)
Potassium: 3.8 mmol/L (ref 3.5–5.1)
Sodium: 139 mmol/L (ref 135–145)
Total Bilirubin: 0.1 mg/dL — ABNORMAL LOW (ref 0.3–1.2)
Total Protein: 8.3 g/dL — ABNORMAL HIGH (ref 6.5–8.1)

## 2015-02-16 LAB — CBC WITH DIFFERENTIAL/PLATELET
BASOS ABS: 0.1 10*3/uL (ref 0.0–0.1)
Basophils Relative: 1 % (ref 0–1)
EOS ABS: 0.2 10*3/uL (ref 0.0–0.7)
Eosinophils Relative: 2 % (ref 0–5)
HCT: 46.9 % (ref 39.0–52.0)
HEMOGLOBIN: 16.7 g/dL (ref 13.0–17.0)
Lymphocytes Relative: 42 % (ref 12–46)
Lymphs Abs: 3.6 10*3/uL (ref 0.7–4.0)
MCH: 31.1 pg (ref 26.0–34.0)
MCHC: 35.6 g/dL (ref 30.0–36.0)
MCV: 87.3 fL (ref 78.0–100.0)
Monocytes Absolute: 0.7 10*3/uL (ref 0.1–1.0)
Monocytes Relative: 8 % (ref 3–12)
NEUTROS PCT: 47 % (ref 43–77)
Neutro Abs: 4 10*3/uL (ref 1.7–7.7)
PLATELETS: 356 10*3/uL (ref 150–400)
RBC: 5.37 MIL/uL (ref 4.22–5.81)
RDW: 14.3 % (ref 11.5–15.5)
WBC: 8.6 10*3/uL (ref 4.0–10.5)

## 2015-02-16 LAB — LIPASE, BLOOD: LIPASE: 47 U/L (ref 22–51)

## 2015-02-16 MED ORDER — METRONIDAZOLE 500 MG PO TABS: 500.0000 mg | ORAL_TABLET | Freq: Once | ORAL | Status: DC

## 2015-02-16 MED ORDER — CIPROFLOXACIN HCL 500 MG PO TABS: 500.0000 mg | ORAL_TABLET | Freq: Once | ORAL | Status: DC

## 2015-02-16 MED ORDER — OXYCODONE-ACETAMINOPHEN 5-325 MG PO TABS: 1.0000 | ORAL_TABLET | Freq: Once | ORAL | Status: DC

## 2015-02-16 MED ORDER — POLYETHYLENE GLYCOL 3350 17 GM/SCOOP PO POWD: ORAL | Status: DC

## 2015-02-16 NOTE — ED Provider Notes (Signed)
CSN: [id number]     Arrival date & time 02/15/15  2308 History   First MD Initiated Contact with Patient 02/15/15 2359     Chief Complaint  Patient presents with  . Bloated     (Consider location/radiation/quality/duration/timing/severity/associated sxs/prior Treatment) HPI  This a 44 year old male with a history of hypertension and GERD who presents with abdominal bloating. Patient reports 3-4 day history of worsening abdominal bloating. He denies pain but reports "pressure across my abdomen." Patient reports normal bowel movements. He has taken Gas-X and mag citrate with some relief. The symptoms are worse with eating. He currently rates his pressure it 7 out of 10. Denies any abdominal surgical history. Denies any prior symptoms. States "I feel like I'm pregnant."  Past Medical History  Diagnosis Date  . Hypertension   . GERD (gastroesophageal reflux disease)   . Pleurisy    Past Surgical History  Procedure Laterality Date  . Tonsillectomy     History reviewed. No pertinent family history. History  Substance Use Topics  . Smoking status: Current Every Day Smoker -- 0.50 packs/day    Types: Cigarettes  . Smokeless tobacco: Former NeurosurgeonUser    Quit date: 07/15/2012  . Alcohol Use: Yes    Review of Systems  Constitutional: Negative.  Negative for fever.  Respiratory: Negative.  Negative for chest tightness and shortness of breath.   Cardiovascular: Negative.  Negative for chest pain.  Gastrointestinal: Positive for abdominal pain. Negative for nausea, vomiting, diarrhea and constipation.  Genitourinary: Negative.  Negative for dysuria.  Musculoskeletal: Negative for back pain.  Neurological: Negative for headaches.  All other systems reviewed and are negative.     Allergies  Review of patient's allergies indicates no known allergies.  Home Medications   Prior to Admission medications   Medication Sig Start Date End Date Taking? Authorizing Provider  amLODipine  (NORVASC) 5 MG tablet Take 5 mg by mouth daily.    Historical Provider, MD  amLODipine (NORVASC) 5 MG tablet Take 1 tablet (5 mg total) by mouth daily. 07/21/12   Geoffery Lyonsouglas Delo, MD  amLODipine (NORVASC) 5 MG tablet Take 2 tablets (10 mg total) by mouth daily. 02/11/14   April Palumbo, MD  bimatoprost (LUMIGAN) 0.03 % ophthalmic solution 1 drop at bedtime.    Historical Provider, MD  cyclobenzaprine (FLEXERIL) 10 MG tablet Take 1 tablet (10 mg total) by mouth 2 (two) times daily as needed for muscle spasms. 11/04/13   Shon Batonourtney F Valton Schwartz, MD  loratadine (CLARITIN) 10 MG tablet Take 1 tablet (10 mg total) by mouth daily. 03/05/12 02/11/14  April Palumbo, MD  OMEPRAZOLE PO Take by mouth.    Historical Provider, MD  polyethylene glycol powder (MIRALAX) powder Take one capful by mouth in a drink of your choice twice daily until your stools are regular and loose. 02/16/15   Shon Batonourtney F Adrianah Prophete, MD  Pseudoephedrine-Acetaminophen (SUDAFED SINUS PO) Take by mouth.    Historical Provider, MD   BP 131/81 mmHg  Pulse 97  Temp(Src) 98.7 F (37.1 C) (Oral)  Resp 20  Ht 5\' 11"  (1.803 m)  Wt 246 lb (111.585 kg)  BMI 34.33 kg/m2  SpO2 97% Physical Exam  Constitutional: He is oriented to person, place, and time. He appears well-developed and well-nourished. No distress.  HENT:  Head: Normocephalic and atraumatic.  Eyes: Pupils are equal, round, and reactive to light.  Cardiovascular: Normal rate, regular rhythm and normal heart sounds.   No murmur heard. Pulmonary/Chest: Effort normal and breath sounds normal. No  respiratory distress. He has no wheezes.  Abdominal: Soft. There is no tenderness. There is no rebound.  Hyperactive bowel sounds, mild distention  Musculoskeletal: He exhibits no edema.  Neurological: He is alert and oriented to person, place, and time.  Skin: Skin is warm and dry.  Psychiatric: He has a normal mood and affect.  Nursing note and vitals reviewed.   ED Course  Procedures (including  critical care time) Labs Review Labs Reviewed  COMPREHENSIVE METABOLIC PANEL - Abnormal; Notable for the following:    Glucose, Bld 114 (*)    Total Protein 8.3 (*)    Total Bilirubin 0.1 (*)    All other components within normal limits  CBC WITH DIFFERENTIAL/PLATELET  LIPASE, BLOOD    Imaging Review Dg Abd 1 View  02/16/2015   CLINICAL DATA:  Bloating and discomfort for 4 days.  EXAM: ABDOMEN - 1 VIEW  COMPARISON:  01/12/2013  FINDINGS: The bowel gas pattern is normal. No radio-opaque calculi or other significant radiographic abnormality are seen.  IMPRESSION: Negative.   Electronically Signed   By: Ellery Plunkaniel R Mitchell M.D.   On: 02/16/2015 01:29     EKG Interpretation None      MDM   Final diagnoses:  Abdominal bloating    Patient presents with abdominal bloating. Denies pain. Nontoxic on exam.  Mild distention noted on exam. Nontender. Pain is improved with bowel movements suggesting constipation. Basic labwork obtained and reassuring. Abdominal KUB shows no evidence of significant stool burden and no evidence of air-fluid levels suggestive of small bowel obstruction. Patient had a bowel movement while in the emergency room and reports some improvement of his bloating. Discussed with patient presumptive treatment for constipation with Miralax twice daily.  Patient is agreeable to plan. He was given strict return precautions.  After history, exam, and medical workup I feel the patient has been appropriately medically screened and is safe for discharge home. Pertinent diagnoses were discussed with the patient. Patient was given return precautions.     Shon Batonourtney F Austan Nicholl, MD 02/16/15 [phone]

## 2015-02-16 NOTE — Discharge Instructions (Signed)
Bloating Bloating is the feeling of fullness in your belly. You may feel as though your pants are too tight. Often the cause of bloating is overeating, retaining fluids, or having gas in your bowel. It is also caused by swallowing air and eating foods that cause gas. Irritable bowel syndrome is one of the most common causes of bloating. Constipation is also a common cause. Sometimes more serious problems can cause bloating. SYMPTOMS  Usually there is a feeling of fullness, as though your abdomen is bulged out. There may be mild discomfort.  DIAGNOSIS  Usually no particular testing is necessary for most bloating. If the condition persists and seems to become worse, your caregiver may do additional testing.  TREATMENT   There is no direct treatment for bloating.  Do not put gas into the bowel. Avoid chewing gum and sucking on candy. These tend to make you swallow air. Swallowing air can also be a nervous habit. Try to avoid this.  Avoiding high residue diets will help. Eat foods with soluble fibers (examples include root vegetables, apples, or barley) and substitute dairy products with soy and rice products. This helps irritable bowel syndrome.  If constipation is the cause, then a high residue diet with more fiber will help.  Avoid carbonated beverages.  Over-the-counter preparations are available that help reduce gas. Your pharmacist can help you with this. SEEK MEDICAL CARE IF:   Bloating continues and seems to be getting worse.  You notice a weight gain.  You have a weight loss but the bloating is getting worse.  You have changes in your bowel habits or develop nausea or vomiting. SEEK IMMEDIATE MEDICAL CARE IF:   You develop shortness of breath or swelling in your legs.  You have an increase in abdominal pain or develop chest pain. Document Released: 07/30/2006 Document Revised: 12/22/2011 Document Reviewed: 09/17/2007 Exit[employer] Patient Information 2015 [employer]. This  information is not intended to replace advice given to you by your health care provider. Make sure you discuss any questions you have with your health care provider.  

## 2015-02-26 ENCOUNTER — Emergency Department (HOSPITAL_BASED_OUTPATIENT_CLINIC_OR_DEPARTMENT_OTHER)
Admission: EM | Admit: 2015-02-26 | Discharge: 2015-02-26 | Disposition: A | Discharge status: Home / Self Care | Payer: Self-pay | Attending: Emergency Medicine | Admitting: Emergency Medicine

## 2015-02-26 ENCOUNTER — Encounter (HOSPITAL_BASED_OUTPATIENT_CLINIC_OR_DEPARTMENT_OTHER): Payer: Self-pay | Admitting: *Deleted

## 2015-02-26 DIAGNOSIS — K219 Gastro-esophageal reflux disease without esophagitis: Secondary | ICD-10-CM | POA: Insufficient documentation

## 2015-02-26 DIAGNOSIS — Z720 Tobacco use: Secondary | ICD-10-CM | POA: Insufficient documentation

## 2015-02-26 DIAGNOSIS — M546 Pain in thoracic spine: Secondary | ICD-10-CM | POA: Insufficient documentation

## 2015-02-26 DIAGNOSIS — M549 Dorsalgia, unspecified: Secondary | ICD-10-CM

## 2015-02-26 DIAGNOSIS — Z79899 Other long term (current) drug therapy: Secondary | ICD-10-CM | POA: Insufficient documentation

## 2015-02-26 DIAGNOSIS — I10 Essential (primary) hypertension: Secondary | ICD-10-CM | POA: Insufficient documentation

## 2015-02-26 MED ORDER — DIAZEPAM 5 MG PO TABS
5.0000 mg | ORAL_TABLET | Freq: Once | ORAL | Status: AC
  Administered 2015-02-26: 5 mg via ORAL
  Filled 2015-02-26: qty 1

## 2015-02-26 MED ORDER — IBUPROFEN 800 MG PO TABS: 800.0000 mg | ORAL_TABLET | Freq: Three times a day (TID) | ORAL | Status: AC

## 2015-02-26 MED ORDER — KETOROLAC TROMETHAMINE 30 MG/ML IJ SOLN
30.0000 mg | Freq: Once | INTRAMUSCULAR | Status: AC
  Administered 2015-02-26: 30 mg via INTRAMUSCULAR
  Filled 2015-02-26: qty 1

## 2015-02-26 MED ORDER — HYDROCODONE-ACETAMINOPHEN 5-325 MG PO TABS: 1.0000 | ORAL_TABLET | Freq: Four times a day (QID) | ORAL | Status: DC | PRN

## 2015-02-26 MED ORDER — DIAZEPAM 5 MG PO TABS: 5.0000 mg | ORAL_TABLET | Freq: Two times a day (BID) | ORAL | Status: AC

## 2015-02-26 MED ORDER — HYDROCODONE-ACETAMINOPHEN 5-325 MG PO TABS
1.0000 | ORAL_TABLET | Freq: Once | ORAL | Status: AC
  Administered 2015-02-26: 1 via ORAL
  Filled 2015-02-26: qty 1

## 2015-02-26 NOTE — ED Notes (Signed)
MD at bedside. 

## 2015-02-26 NOTE — ED Notes (Addendum)
D/c home with ride per pt - directed to pharmacy to pick up medications

## 2015-02-26 NOTE — ED Provider Notes (Signed)
CSN: [id number]     Arrival date & time 02/26/15  [id number] History   First MD Initiated Contact with Patient 02/26/15 1009     Chief Complaint  Patient presents with  . Back Pain     (Consider location/radiation/quality/duration/timing/severity/associated sxs/prior Treatment) HPI Patient presents with new back pain. Pain began about 3 weeks ago, has been persistent. The pain is focally in the mid/lower thoracic area, nonradiating. The pain is worse on the right, minimally better on the left. There is no new dysesthesia or weakness in any extremity, no new abdominal pain, chest pain, dyspnea, fever, chills. Onset was atraumatic, without clear precipitant. Symptoms have not gotten better with rest Patient has not taken any medication for pain relief. He has no history of back surgery, trauma in the past.  Past Medical History  Diagnosis Date  . Hypertension   . GERD (gastroesophageal reflux disease)   . Pleurisy    Past Surgical History  Procedure Laterality Date  . Tonsillectomy     No family history on file. History  Substance Use Topics  . Smoking status: Current Some Day Smoker -- 0.50 packs/day    Types: Cigarettes, Cigars  . Smokeless tobacco: Former NeurosurgeonUser    Quit date: 07/15/2012  . Alcohol Use: Yes     Comment: rare    Review of Systems  Constitutional:       Per HPI, otherwise negative  HENT:       Per HPI, otherwise negative  Respiratory:       Per HPI, otherwise negative  Cardiovascular:       Per HPI, otherwise negative  Gastrointestinal: Negative for vomiting.  Endocrine:       Negative aside from HPI  Genitourinary:       Neg aside from HPI   Musculoskeletal:       Per HPI, otherwise negative  Skin: Negative.   Neurological: Negative for syncope.      Allergies  Review of patient's allergies indicates no known allergies.  Home Medications   Prior to Admission medications   Medication Sig Start Date End Date Taking? Authorizing Provider   amLODipine (NORVASC) 5 MG tablet Take 5 mg by mouth daily.   Yes Historical Provider, MD  bimatoprost (LUMIGAN) 0.03 % ophthalmic solution 1 drop at bedtime.   Yes Historical Provider, MD  loratadine (CLARITIN) 10 MG tablet Take 1 tablet (10 mg total) by mouth daily. 03/05/12 02/26/15 Yes April Palumbo, MD  OMEPRAZOLE PO Take by mouth.   Yes Historical Provider, MD  polyethylene glycol powder (MIRALAX) powder Take one capful by mouth in a drink of your choice twice daily until your stools are regular and loose. 02/16/15  Yes Shon Batonourtney F Horton, MD  amLODipine (NORVASC) 5 MG tablet Take 1 tablet (5 mg total) by mouth daily. 07/21/12   Geoffery Lyonsouglas Delo, MD  amLODipine (NORVASC) 5 MG tablet Take 2 tablets (10 mg total) by mouth daily. 02/11/14   April Palumbo, MD  cyclobenzaprine (FLEXERIL) 10 MG tablet Take 1 tablet (10 mg total) by mouth 2 (two) times daily as needed for muscle spasms. 11/04/13   Shon Batonourtney F Horton, MD  Pseudoephedrine-Acetaminophen (SUDAFED SINUS PO) Take by mouth.    Historical Provider, MD   BP 138/89 mmHg  Pulse 97  Temp(Src) 98.7 F (37.1 C) (Oral)  Resp 16  Ht 5\' 11"  (1.803 m)  Wt 246 lb (111.585 kg)  BMI 34.33 kg/m2 Physical Exam  Constitutional: He is oriented to person, place, and time. He appears  well-developed. No distress.  HENT:  Head: Normocephalic and atraumatic.  Eyes: Conjunctivae and EOM are normal.  Cardiovascular: Normal rate and regular rhythm.   Pulmonary/Chest: Effort normal. No stridor. No respiratory distress.  Abdominal: He exhibits no distension.  Musculoskeletal: He exhibits no edema.  No appreciable deformity on the back, patient states the pain is slightly better on the left than the right, in the paraspinal area just inferior to the scapula borders  Neurological: He is alert and oriented to person, place, and time.  Skin: Skin is warm and dry.  Psychiatric: He has a normal mood and affect.  Nursing note and vitals reviewed.   ED Course  Procedures  (including critical care time) On repeat exam the patient appears calm, states that he feels better.   MDM  Patient presents with atraumatic back pain. No evidence for deep infection, nor neurologic compromise. Patient had improvement here with initiation of medication, was discharged in stable condition to follow up with sports medicine.     Gerhard Munchobert Pinkey Mcjunkin, MD 02/26/15 [phone]

## 2015-02-26 NOTE — Discharge Instructions (Signed)
As discussed, your evaluation today has been largely reassuring.  But, it is important that you monitor your condition carefully, and do not hesitate to return to the ED if you develop new, or concerning changes in your condition.  Otherwise, please follow-up with your physician for appropriate ongoing care.  Back Exercises Back exercises help treat and prevent back injuries. The goal of back exercises is to increase the strength of your abdominal and back muscles and the flexibility of your back. These exercises should be started when you no longer have back pain. Back exercises include:  Pelvic Tilt. Lie on your back with your knees bent. Tilt your pelvis until the lower part of your back is against the floor. Hold this position 5 to 10 sec and repeat 5 to 10 times.  Knee to Chest. Pull first 1 knee up against your chest and hold for 20 to 30 seconds, repeat this with the other knee, and then both knees. This may be done with the other leg straight or bent, whichever feels better.  Sit-Ups or Curl-Ups. Bend your knees 90 degrees. Start with tilting your pelvis, and do a partial, slow sit-up, lifting your trunk only 30 to 45 degrees off the floor. Take at least 2 to 3 seconds for each sit-up. Do not do sit-ups with your knees out straight. If partial sit-ups are difficult, simply do the above but with only tightening your abdominal muscles and holding it as directed.  Hip-Lift. Lie on your back with your knees flexed 90 degrees. Push down with your feet and shoulders as you raise your hips a couple inches off the floor; hold for 10 seconds, repeat 5 to 10 times.  Back arches. Lie on your stomach, propping yourself up on bent elbows. Slowly press on your hands, causing an arch in your low back. Repeat 3 to 5 times. Any initial stiffness and discomfort should lessen with repetition over time.  Shoulder-Lifts. Lie face down with arms beside your body. Keep hips and torso pressed to floor as you slowly  lift your head and shoulders off the floor. Do not overdo your exercises, especially in the beginning. Exercises may cause you some mild back discomfort which lasts for a few minutes; however, if the pain is more severe, or lasts for more than 15 minutes, do not continue exercises until you see your caregiver. Improvement with exercise therapy for back problems is slow.  See your caregivers for assistance with developing a proper back exercise program. Document Released: 11/06/2004 Document Revised: 12/22/2011 Document Reviewed: 07/31/2011 [facility]oscopyExitCare Patient Information 2015 [place] LearExitCare, [place]. This information is not intended to replace advice given to you by your health care provider. Make sure you discuss any questions you have with your health care provider.  Back Pain, Adult Low back pain is very common. About 1 in 5 people have back pain.The cause of low back pain is rarely dangerous. The pain often gets better over time.About half of people with a sudden onset of back pain feel better in just 2 weeks. About 8 in 10 people feel better by 6 weeks.  CAUSES Some common causes of back pain include:  Strain of the muscles or ligaments supporting the spine.  Wear and tear (degeneration) of the spinal discs.  Arthritis.  Direct injury to the back. DIAGNOSIS Most of the time, the direct cause of low back pain is not known.However, back pain can be treated effectively even when the exact cause of the pain is unknown.Answering your caregiver's questions  about your overall health and symptoms is one of the most accurate ways to make sure the cause of your pain is not dangerous. If your caregiver needs more information, he or she may order lab work or imaging tests (X-rays or MRIs).However, even if imaging tests show changes in your back, this usually does not require surgery. HOME CARE INSTRUCTIONS For many people, back pain returns.Since low back pain is rarely dangerous, it is often a condition  that people can learn to [facility] Hospitalmanageon their own.   Remain active. It is stressful on the back to sit or stand in one place. Do not sit, drive, or stand in one place for more than 30 minutes at a time. Take short walks on level surfaces as soon as pain allows.Try to increase the length of time you walk each day.  Do not stay in bed.Resting more than 1 or 2 days can delay your recovery.  Do not avoid exercise or work.Your body is made to move.It is not dangerous to be active, even though your back may hurt.Your back will likely heal faster if you return to being active before your pain is gone.  Pay attention to your body when you bend and lift. Many people have less discomfortwhen lifting if they bend their knees, keep the load close to their bodies,and avoid twisting. Often, the most comfortable positions are those that put less stress on your recovering back.  Find a comfortable position to sleep. Use a firm mattress and lie on your side with your knees slightly bent. If you lie on your back, put a pillow under your knees.  Only take over-the-counter or prescription medicines as directed by your caregiver. Over-the-counter medicines to reduce pain and inflammation are often the most helpful.Your caregiver may prescribe muscle relaxant drugs.These medicines help dull your pain so you can more quickly return to your normal activities and healthy exercise.  Put ice on the injured area.  Put ice in a plastic bag.  Place a towel between your skin and the bag.  Leave the ice on for 15-20 minutes, 03-04 times a day for the first 2 to 3 days. After that, ice and heat may be alternated to reduce pain and spasms.  Ask your caregiver about trying back exercises and gentle massage. This may be of some benefit.  Avoid feeling anxious or stressed.Stress increases muscle tension and can worsen back pain.It is important to recognize when you are anxious or stressed and learn ways to manage  it.Exercise is a great option. SEEK MEDICAL CARE IF:  You have pain that is not relieved with rest or medicine.  You have pain that does not improve in 1 week.  You have new symptoms.  You are generally not feeling well. SEEK IMMEDIATE MEDICAL CARE IF:   You have pain that radiates from your back into your legs.  You develop new bowel or bladder control problems.  You have unusual weakness or numbness in your arms or legs.  You develop nausea or vomiting.  You develop abdominal pain.  You feel faint. Document Released: 09/29/2005 Document Revised: 03/30/2012 Document Reviewed: 01/31/2014 [facility] SouthExitCare Patient Information 2015 [place]ExitCare, [place]LLC. This information is not intended to replace advice given to you by your health care provider. Make sure you discuss any questions you have with your health care provider.

## 2015-02-26 NOTE — ED Notes (Signed)
Pt called family for ride in presence of RN prior to admin medications- pt verbalizes understanding not to drive for at least 4 hours

## 2015-02-26 NOTE — ED Notes (Signed)
Pt reports 3 weeks of low back pain- denies injury

## 2015-03-05 ENCOUNTER — Ambulatory Visit: Payer: Self-pay | Admitting: Family Medicine

## 2015-03-09 ENCOUNTER — Ambulatory Visit: Payer: Self-pay | Admitting: Family Medicine

## 2015-03-16 ENCOUNTER — Encounter (HOSPITAL_BASED_OUTPATIENT_CLINIC_OR_DEPARTMENT_OTHER): Payer: Self-pay | Admitting: *Deleted

## 2015-03-16 ENCOUNTER — Emergency Department (HOSPITAL_BASED_OUTPATIENT_CLINIC_OR_DEPARTMENT_OTHER)
Admission: EM | Admit: 2015-03-16 | Discharge: 2015-03-17 | Disposition: A | Discharge status: Home / Self Care | Payer: Self-pay | Attending: Emergency Medicine | Admitting: Emergency Medicine

## 2015-03-16 ENCOUNTER — Emergency Department (HOSPITAL_BASED_OUTPATIENT_CLINIC_OR_DEPARTMENT_OTHER): Payer: Self-pay

## 2015-03-16 DIAGNOSIS — I10 Essential (primary) hypertension: Secondary | ICD-10-CM | POA: Insufficient documentation

## 2015-03-16 DIAGNOSIS — Z720 Tobacco use: Secondary | ICD-10-CM | POA: Insufficient documentation

## 2015-03-16 DIAGNOSIS — K219 Gastro-esophageal reflux disease without esophagitis: Secondary | ICD-10-CM | POA: Insufficient documentation

## 2015-03-16 DIAGNOSIS — Z79899 Other long term (current) drug therapy: Secondary | ICD-10-CM | POA: Insufficient documentation

## 2015-03-16 DIAGNOSIS — B349 Viral infection, unspecified: Secondary | ICD-10-CM | POA: Insufficient documentation

## 2015-03-16 MED ORDER — BENZONATATE 100 MG PO CAPS
200.0000 mg | ORAL_CAPSULE | Freq: Once | ORAL | Status: AC
  Administered 2015-03-16: 200 mg via ORAL
  Filled 2015-03-16: qty 2

## 2015-03-16 NOTE — ED Notes (Signed)
Productive cough for a week. Scratchy throat.

## 2015-03-16 NOTE — ED Provider Notes (Signed)
CSN: [id number]     Arrival date & time 03/16/15  2227 History  This chart was scribed for Peta Peachey, MD by Bronson Curb, ED Scribe. This patient was seen in room MH06/MH06 and the patient's care was started at 11:48 PM.   Chief Complaint  Patient presents with  . Cough  . Sore Throat    Patient is a 44 y.o. male presenting with cough. The history is provided by the patient. No language interpreter was used.  Cough Cough characteristics:  Productive Severity:  Moderate Duration:  8 days Timing:  Intermittent Progression:  Unchanged Chronicity:  New Smoker: yes   Context: not animal exposure   Relieved by:  None tried Worsened by:  Nothing tried Ineffective treatments:  None tried Associated symptoms: sore throat   Associated symptoms: no chest pain, no chills, no fever, no rhinorrhea, no shortness of breath, no weight loss and no wheezing   Risk factors: no chemical exposure      HPI Comments: Aaron Weber is a 44 y.o. male who presents to the Emergency Department complaining of persistent, intermittent, productive cough of brown sputum with associated sore throat for the past 8 days. Patient has not taken anything for symptom relief, stating he was unsure of what to take. He denies any other symptoms at this time.   Past Medical History  Diagnosis Date  . Hypertension   . GERD (gastroesophageal reflux disease)   . Pleurisy    Past Surgical History  Procedure Laterality Date  . Tonsillectomy     No family history on file. History  Substance Use Topics  . Smoking status: Current Some Day Smoker -- 0.50 packs/day    Types: Cigarettes, Cigars  . Smokeless tobacco: Former Neurosurgeon    Quit date: 07/15/2012  . Alcohol Use: Yes     Comment: rare    Review of Systems  Constitutional: Negative for fever, chills and weight loss.  HENT: Positive for sore throat. Negative for drooling and rhinorrhea.   Respiratory: Positive for cough. Negative for shortness of breath  and wheezing.   Cardiovascular: Negative for chest pain.  All other systems reviewed and are negative.     Allergies  Review of patient's allergies indicates no known allergies.  Home Medications   Prior to Admission medications   Medication Sig Start Date End Date Taking? Authorizing Provider  amLODipine (NORVASC) 5 MG tablet Take 5 mg by mouth daily.    Historical Provider, MD  bimatoprost (LUMIGAN) 0.03 % ophthalmic solution 1 drop at bedtime.    Historical Provider, MD  cyclobenzaprine (FLEXERIL) 10 MG tablet Take 1 tablet (10 mg total) by mouth 2 (two) times daily as needed for muscle spasms. 11/04/13   Shon Baton, MD  HYDROcodone-acetaminophen (NORCO/VICODIN) 5-325 MG per tablet Take 1 tablet by mouth every 6 (six) hours as needed for severe pain. 02/26/15   Gerhard Munch, MD  loratadine (CLARITIN) 10 MG tablet Take 1 tablet (10 mg total) by mouth daily. 03/05/12 02/26/15  Diannia Hogenson, MD  OMEPRAZOLE PO Take by mouth.    Historical Provider, MD  polyethylene glycol powder (MIRALAX) powder Take one capful by mouth in a drink of your choice twice daily until your stools are regular and loose. 02/16/15   Shon Baton, MD   Triage Vitals: BP 128/89 mmHg  Pulse 102  Temp(Src) 98.2 F (36.8 C) (Oral)  Resp 20  Ht  (1.803 m)  Wt 236 lb (107.049 kg)  BMI 32.93 kg/m2  SpO2 98%  Physical Exam  Constitutional: He is oriented to person, place, and time. He appears well-developed and well-nourished. No distress.  HENT:  Head: Normocephalic and atraumatic.  Right Ear: Tympanic membrane, external ear and ear canal normal.  Left Ear: Tympanic membrane, external ear and ear canal normal.  Mouth/Throat: Oropharynx is clear and moist. No oropharyngeal exudate.  Post nasal drip Mucous membranes are moist.  Eyes: Conjunctivae and EOM are normal. Pupils are equal, round, and reactive to light.  Neck: Normal range of motion. Neck supple. No tracheal deviation present.   Intact phonation no pain with displacement of the trachea  Cardiovascular: Normal rate, regular rhythm and normal heart sounds.   Pulmonary/Chest: Effort normal and breath sounds normal. He has no wheezes. He has no rhonchi. He has no rales.  Abdominal: Soft. Bowel sounds are normal. He exhibits no mass. There is no tenderness. There is no rebound and no guarding.  Musculoskeletal: Normal range of motion. He exhibits no edema.  Lymphadenopathy:    He has no cervical adenopathy.  Neurological: He is alert and oriented to person, place, and time.  Skin: Skin is warm and dry.  Psychiatric: He has a normal mood and affect. His behavior is normal.  Nursing note and vitals reviewed.   ED Course  Procedures (including critical care time)  DIAGNOSTIC STUDIES: Oxygen Saturation is 98% on room air, normal by my interpretation.    COORDINATION OF CARE: At 2351 Discussed treatment plan with patient. Patient agrees.   Labs Review Labs Reviewed - No data to display  Imaging Review Dg Chest 2 View  03/16/2015   CLINICAL DATA:  Initial evaluation for acute cough com most throat.  EXAM: CHEST  2 VIEW  COMPARISON:  Prior radiograph from 06/07/2013 the  FINDINGS: The cardiac and mediastinal silhouettes are stable in size and contour, and remain within normal limits.  The lungs are normally inflated. No airspace consolidation, pleural effusion, or pulmonary edema is identified. There is no pneumothorax.  No acute osseous abnormality identified.  IMPRESSION: No active cardiopulmonary disease.   Electronically Signed   By: Rise MuBenjamin  McClintock M.D.   On: 03/16/2015 23:23     EKG Interpretation None      MDM   Final diagnoses:  None  Based on [facility] criteria there is no indication for further testing or treatment  Viral URI will treat symptomatically   I personally performed the services described in this documentation, which was scribed in my presence. The recorded information has been reviewed  and is accurate.     Cy BlamerApril Italo Banton, MD 03/17/15 [phone]

## 2015-03-17 ENCOUNTER — Encounter (HOSPITAL_BASED_OUTPATIENT_CLINIC_OR_DEPARTMENT_OTHER): Payer: Self-pay | Admitting: Emergency Medicine

## 2015-03-17 MED ORDER — FLUTICASONE PROPIONATE 50 MCG/ACT NA SUSP: 2.0000 | Freq: Every day | NASAL | Status: AC

## 2015-03-17 MED ORDER — BENZONATATE 100 MG PO CAPS: 100.0000 mg | ORAL_CAPSULE | Freq: Three times a day (TID) | ORAL | Status: DC

## 2015-03-17 MED ORDER — GUAIFENESIN 200 MG PO TABS: 200.0000 mg | ORAL_TABLET | ORAL | Status: DC | PRN

## 2015-07-19 ENCOUNTER — Emergency Department (HOSPITAL_BASED_OUTPATIENT_CLINIC_OR_DEPARTMENT_OTHER): Payer: Self-pay

## 2015-07-19 ENCOUNTER — Encounter (HOSPITAL_BASED_OUTPATIENT_CLINIC_OR_DEPARTMENT_OTHER): Payer: Self-pay | Admitting: *Deleted

## 2015-07-19 ENCOUNTER — Emergency Department (HOSPITAL_BASED_OUTPATIENT_CLINIC_OR_DEPARTMENT_OTHER)
Admission: EM | Admit: 2015-07-19 | Discharge: 2015-07-19 | Disposition: A | Discharge status: Home / Self Care | Payer: Self-pay | Attending: Emergency Medicine | Admitting: Emergency Medicine

## 2015-07-19 DIAGNOSIS — R109 Unspecified abdominal pain: Secondary | ICD-10-CM

## 2015-07-19 DIAGNOSIS — Z8719 Personal history of other diseases of the digestive system: Secondary | ICD-10-CM | POA: Insufficient documentation

## 2015-07-19 DIAGNOSIS — Z7951 Long term (current) use of inhaled steroids: Secondary | ICD-10-CM | POA: Insufficient documentation

## 2015-07-19 DIAGNOSIS — I10 Essential (primary) hypertension: Secondary | ICD-10-CM | POA: Insufficient documentation

## 2015-07-19 DIAGNOSIS — R1030 Lower abdominal pain, unspecified: Secondary | ICD-10-CM | POA: Insufficient documentation

## 2015-07-19 DIAGNOSIS — Z720 Tobacco use: Secondary | ICD-10-CM | POA: Insufficient documentation

## 2015-07-19 DIAGNOSIS — Z79899 Other long term (current) drug therapy: Secondary | ICD-10-CM | POA: Insufficient documentation

## 2015-07-19 LAB — CBC WITH DIFFERENTIAL/PLATELET
BASOS ABS: 0.1 10*3/uL (ref 0.0–0.1)
Basophils Relative: 1 %
Eosinophils Absolute: 0.2 10*3/uL (ref 0.0–0.7)
Eosinophils Relative: 2 %
HCT: 46.7 % (ref 39.0–52.0)
HEMOGLOBIN: 16.3 g/dL (ref 13.0–17.0)
LYMPHS PCT: 50 %
Lymphs Abs: 4 10*3/uL (ref 0.7–4.0)
MCH: 30.1 pg (ref 26.0–34.0)
MCHC: 34.9 g/dL (ref 30.0–36.0)
MCV: 86.2 fL (ref 78.0–100.0)
Monocytes Absolute: 0.6 10*3/uL (ref 0.1–1.0)
Monocytes Relative: 8 %
NEUTROS PCT: 39 %
Neutro Abs: 3.1 10*3/uL (ref 1.7–7.7)
Platelets: 346 10*3/uL (ref 150–400)
RBC: 5.42 MIL/uL (ref 4.22–5.81)
RDW: 14 % (ref 11.5–15.5)
WBC: 7.9 10*3/uL (ref 4.0–10.5)

## 2015-07-19 LAB — COMPREHENSIVE METABOLIC PANEL
ALT: 24 U/L (ref 17–63)
AST: 15 U/L (ref 15–41)
Albumin: 4.3 g/dL (ref 3.5–5.0)
Alkaline Phosphatase: 76 U/L (ref 38–126)
Anion gap: 7 (ref 5–15)
BUN: 10 mg/dL (ref 6–20)
CALCIUM: 9.6 mg/dL (ref 8.9–10.3)
CO2: 26 mmol/L (ref 22–32)
CREATININE: 1.12 mg/dL (ref 0.61–1.24)
Chloride: 104 mmol/L (ref 101–111)
GFR calc non Af Amer: >60 mL/min (ref >60)
Glucose, Bld: 190 mg/dL — ABNORMAL HIGH (ref 65–99)
Potassium: 3.9 mmol/L (ref 3.5–5.1)
Sodium: 137 mmol/L (ref 135–145)
Total Bilirubin: 0.4 mg/dL (ref 0.3–1.2)
Total Protein: 8 g/dL (ref 6.5–8.1)

## 2015-07-19 LAB — URINALYSIS, ROUTINE W REFLEX MICROSCOPIC
Bilirubin Urine: NEGATIVE
GLUCOSE, UA: NEGATIVE mg/dL
HGB URINE DIPSTICK: NEGATIVE
Ketones, ur: 15 mg/dL — AB
LEUKOCYTES UA: NEGATIVE
Nitrite: NEGATIVE
PH: 5 (ref 5.0–8.0)
PROTEIN: NEGATIVE mg/dL
Specific Gravity, Urine: 1.029 (ref 1.005–1.030)
Urobilinogen, UA: 1 mg/dL (ref 0.0–1.0)

## 2015-07-19 LAB — LIPASE, BLOOD: Lipase: 71 U/L — ABNORMAL HIGH (ref 22–51)

## 2015-07-19 MED ORDER — POLYETHYLENE GLYCOL 3350 17 GM/SCOOP PO POWD: 17.0000 g | Freq: Every day | ORAL | Status: DC

## 2015-07-19 MED ORDER — DOCUSATE SODIUM 100 MG PO CAPS: 100.0000 mg | ORAL_CAPSULE | Freq: Two times a day (BID) | ORAL | Status: DC

## 2015-07-19 NOTE — Discharge Instructions (Signed)
Stool softeners and laxatives as prescribed. Return to ER with worsening pain, bloody stools, nausea or vomiting, fever, or other new or worsening symptoms. You have a mild elevation of your lipase, and enzyme from your pancreas. However, you do not have risk factors for pancreatitis. Your symptoms and exam do not suggest inflamation of your pancreas. If your symptoms worsen please recheck here.   Abdominal Pain, Adult Many things can cause belly (abdominal) pain. Most times, the belly pain is not dangerous. Many cases of belly pain can be watched and treated at home. HOME CARE   Do not take medicines that help you go poop (laxatives) unless told to by your doctor.  Only take medicine as told by your doctor.  Eat or drink as told by your doctor. Your doctor will tell you if you should be on a special diet. GET HELP IF:  You do not know what is causing your belly pain.  You have belly pain while you are sick to your stomach (nauseous) or have runny poop (diarrhea).  You have pain while you pee or poop.  Your belly pain wakes you up at night.  You have belly pain that gets worse or better when you eat.  You have belly pain that gets worse when you eat fatty foods.  You have a fever. GET HELP RIGHT AWAY IF:   The pain does not go away within 2 hours.  You keep throwing up (vomiting).  The pain changes and is only in the right or left part of the belly.  You have bloody or tarry looking poop. MAKE SURE YOU:   Understand these instructions.  Will watch your condition.  Will get help right away if you are not doing well or get worse.   This information is not intended to replace advice given to you by your health care provider. Make sure you discuss any questions you have with your health care provider.   Document Released: 03/17/2008 Document Revised: 10/20/2014 Document Reviewed: 06/08/2013 Elsevier Interactive Patient Education [employer]

## 2015-07-19 NOTE — ED Provider Notes (Signed)
CSN: [id number]     Arrival date & time 07/19/15  1941 History  By signing my name below, I, Gwenyth Ober, attest that this documentation has been prepared under the direction and in the presence of Rolland Porter, MD.  Electronically Signed: Gwenyth Ober, ED Scribe. 07/19/2015. 8:10 PM.   Chief Complaint  Patient presents with  . Abdominal Pain   The history is provided by the patient. No language interpreter was used.    HPI Comments: Aaron Weber is a 44 y.o. male who presents to the Emergency Department complaining of constant, moderate lower abdominal pain that started 4 days ago. He states multiple soft and loose stools as associated symptoms. Pt tried epsom salts with no relief. Pt states onset of pain started after he consumed salad and increased his fiber intake. He does not eat salad regularly. Pt smokes, but does not drink alcohol. Pt's wife denies a history of GI problems or abdominal surgeries. He also denies nausea, vomiting, diarrhea and dysuria.  Past Medical History  Diagnosis Date  . Hypertension   . GERD (gastroesophageal reflux disease)   . Pleurisy    Past Surgical History  Procedure Laterality Date  . Tonsillectomy     History reviewed. No pertinent family history. Social History  Substance Use Topics  . Smoking status: Current Some Day Smoker -- 0.50 packs/day    Types: Cigarettes, Cigars  . Smokeless tobacco: Former Neurosurgeon    Quit date: 07/15/2012  . Alcohol Use: Yes     Comment: rare    Review of Systems  Constitutional: Negative for fever, chills, diaphoresis, appetite change and fatigue.  HENT: Negative for mouth sores, sore throat and trouble swallowing.   Eyes: Negative for visual disturbance.  Respiratory: Negative for cough, chest tightness, shortness of breath and wheezing.   Cardiovascular: Negative for chest pain.  Gastrointestinal: Positive for abdominal pain. Negative for nausea, vomiting, diarrhea and abdominal distention.   Endocrine: Negative for polydipsia, polyphagia and polyuria.  Genitourinary: Negative for dysuria, frequency, hematuria and difficulty urinating.  Musculoskeletal: Negative for gait problem.  Skin: Negative for color change, pallor and rash.  Neurological: Negative for dizziness, syncope, light-headedness and headaches.  Hematological: Does not bruise/bleed easily.  Psychiatric/Behavioral: Negative for behavioral problems and confusion.  All other systems reviewed and are negative.     Allergies  Review of patient's allergies indicates no known allergies.  Home Medications   Prior to Admission medications   Medication Sig Start Date End Date Taking? Authorizing Provider  Dexlansoprazole (DEXILANT PO) Take by mouth.   Yes Historical Provider, MD  amLODipine (NORVASC) 5 MG tablet Take 5 mg by mouth daily.    Historical Provider, MD  benzonatate (TESSALON) 100 MG capsule Take 1 capsule (100 mg total) by mouth every 8 (eight) hours. 03/17/15   April Palumbo, MD  bimatoprost (LUMIGAN) 0.03 % ophthalmic solution 1 drop at bedtime.    Historical Provider, MD  cyclobenzaprine (FLEXERIL) 10 MG tablet Take 1 tablet (10 mg total) by mouth 2 (two) times daily as needed for muscle spasms. 11/04/13   Shon Baton, MD  docusate sodium (COLACE) 100 MG capsule Take 1 capsule (100 mg total) by mouth every 12 (twelve) hours. 07/19/15   Rolland Porter, MD  fluticasone (FLONASE) 50 MCG/ACT nasal spray Place 2 sprays into both nostrils daily. 03/17/15   April Palumbo, MD  guaiFENesin 200 MG tablet Take 1 tablet (200 mg total) by mouth every 4 (four) hours as needed for cough or to loosen  phlegm. 03/17/15   April Palumbo, MD  HYDROcodone-acetaminophen (NORCO/VICODIN) 5-325 MG per tablet Take 1 tablet by mouth every 6 (six) hours as needed for severe pain. 02/26/15   Gerhard Munch, MD  loratadine (CLARITIN) 10 MG tablet Take 1 tablet (10 mg total) by mouth daily. 03/05/12 02/26/15  April Palumbo, MD  OMEPRAZOLE PO  Take by mouth.    Historical Provider, MD  polyethylene glycol powder (GLYCOLAX/MIRALAX) powder Take 17 g by mouth daily. 07/19/15   Rolland Porter, MD   BP 123/84 mmHg  Pulse 98  Temp(Src) 98.4 F (36.9 C) (Oral)  Resp 18  Ht  (1.803 m)  Wt 234 lb (106.142 kg)  BMI 32.65 kg/m2  SpO2 98% Physical Exam  Constitutional: He is oriented to person, place, and time. He appears well-developed and well-nourished. No distress.  HENT:  Head: Normocephalic.  Eyes: Conjunctivae are normal. Pupils are equal, round, and reactive to light. No scleral icterus.  Neck: Normal range of motion. Neck supple. No thyromegaly present.  Cardiovascular: Normal rate and regular rhythm.  Exam reveals no gallop and no friction rub.   No murmur heard. Pulmonary/Chest: Effort normal and breath sounds normal. No respiratory distress. He has no wheezes. He has no rales.  Abdominal: Soft. Bowel sounds are normal. He exhibits no distension. There is no tenderness. There is no rebound.  Musculoskeletal: Normal range of motion.  Neurological: He is alert and oriented to person, place, and time.  Skin: Skin is warm and dry. No rash noted.  Psychiatric: He has a normal mood and affect. His behavior is normal.  Nursing note and vitals reviewed.   ED Course  Procedures   DIAGNOSTIC STUDIES: Oxygen Saturation is 98% on RA, normal by my interpretation.    COORDINATION OF CARE: 8:08 PM Discussed treatment plan with pt which includes labwork and an x-ray of his abdomen. Pt agreed to plan.   Labs Review Labs Reviewed  COMPREHENSIVE METABOLIC PANEL - Abnormal; Notable for the following:    Glucose, Bld 190 (*)    All other components within normal limits  LIPASE, BLOOD - Abnormal; Notable for the following:    Lipase 71 (*)    All other components within normal limits  URINALYSIS, ROUTINE W REFLEX MICROSCOPIC (NOT AT [facility]) - Abnormal; Notable for the following:    Ketones, ur 15 (*)    All other components within  normal limits  CBC WITH DIFFERENTIAL/PLATELET    Imaging Review Dg Abd Acute W/chest  07/19/2015   CLINICAL DATA:  Bloating and abdominal pain for several days  EXAM: DG ABDOMEN ACUTE W/ 1V CHEST  COMPARISON:  None.  FINDINGS: Cardiac shadow is within normal limits. The lungs are clear bilaterally.  Scattered large and small bowel gas is noted. The stomach is filled with ingested material. No obstructive changes are noted. No abnormal mass or abnormal calcifications are seen. The bony structures are unremarkable.  IMPRESSION: No acute abnormality noted.   Electronically Signed   By: Alcide Clever M.D.   On: 07/19/2015 20:49   I have personally reviewed and evaluated these images and lab results as part of my medical decision-making.   EKG Interpretation None      MDM   Final diagnoses:  Abdominal pain, unspecified abdominal location    X-ray suggests a large-volume stool persisting in his left colon. He hasn't lipase is 71. Upper limits of normal 51. He does not have pain in his back. His tenderness in his epigastrium. Symptoms are intermittent  he states "moving all over". He is describing intestinal colic. He does not have local tenderness or peritoneal irritation. I think is appropriate for outpatient treatment. It made him aware of the mild elevation of his lipase. He is not on any medications and Allene Dillon are causing pancreatitis. He does not use alcohol on any regular basis. He states his last use was over a month ago. Astra recheck here with any worsening symptoms. Prescription for Colace and relax. Clear liquids and hydration until Improving symptoms.  I personally performed the services described in this documentation, which was scribed in my presence. The recorded information has been reviewed and is accurate.    Rolland Porter, MD 07/19/15 2145

## 2015-07-19 NOTE — ED Notes (Signed)
Generalized abd pain x 4 days

## 2015-07-19 NOTE — ED Notes (Addendum)
Abdominal pain x 4 days. States it feels like gas. No relief with BM. States the pain started after eating a salad and increasing his fiber intake.

## 2015-07-23 ENCOUNTER — Encounter (HOSPITAL_BASED_OUTPATIENT_CLINIC_OR_DEPARTMENT_OTHER): Payer: Self-pay | Admitting: *Deleted

## 2015-07-23 ENCOUNTER — Emergency Department (HOSPITAL_BASED_OUTPATIENT_CLINIC_OR_DEPARTMENT_OTHER)
Admission: EM | Admit: 2015-07-23 | Discharge: 2015-07-24 | Disposition: A | Discharge status: Home / Self Care | Payer: Self-pay | Attending: Emergency Medicine | Admitting: Emergency Medicine

## 2015-07-23 DIAGNOSIS — Z79899 Other long term (current) drug therapy: Secondary | ICD-10-CM | POA: Insufficient documentation

## 2015-07-23 DIAGNOSIS — R140 Abdominal distension (gaseous): Secondary | ICD-10-CM | POA: Insufficient documentation

## 2015-07-23 DIAGNOSIS — R109 Unspecified abdominal pain: Secondary | ICD-10-CM

## 2015-07-23 DIAGNOSIS — Z720 Tobacco use: Secondary | ICD-10-CM | POA: Insufficient documentation

## 2015-07-23 DIAGNOSIS — Z7951 Long term (current) use of inhaled steroids: Secondary | ICD-10-CM | POA: Insufficient documentation

## 2015-07-23 DIAGNOSIS — I10 Essential (primary) hypertension: Secondary | ICD-10-CM | POA: Insufficient documentation

## 2015-07-23 DIAGNOSIS — K219 Gastro-esophageal reflux disease without esophagitis: Secondary | ICD-10-CM | POA: Insufficient documentation

## 2015-07-23 NOTE — ED Provider Notes (Signed)
CSN: [id number]   Arrival date & time 07/23/15 2206  History  By signing my name below, I, Aaron Weber, attest that this documentation has been prepared under the direction and in the presence of Derwood Kaplan, MD. Electronically Signed: Bethel Weber, ED Scribe. 07/23/2015. 1:43 AM.  Chief Complaint  Patient presents with  . Abdominal Pain    HPI The history is provided by the patient. No language interpreter was used.   Aaron Weber is a 44 y.o. male with PMHx of GERD who presents to the Emergency Department complaining of constant and diffuse abdominal discomfort with onset 1 week ago. He describes the discomfort as bloating/pressure that has not improved after laxative use. He has been having regular bowel movements and passing gas without sustained periods of relief.  The discomfort is worse after eating. His appetite has been normal.  Associated symptoms include intermittent abdominal cramping. Pt denies vomiting, nausea, dysuria, and hematuria. No history of kidney stones, kidney infection, or abdominal surgery.  He smokes 1/2 PPD. Pt uses alcohol socially and denies illicit drug use.   Past Medical History  Diagnosis Date  . Hypertension   . GERD (gastroesophageal reflux disease)   . Pleurisy     Past Surgical History  Procedure Laterality Date  . Tonsillectomy      No family history on file.  Social History  Substance Use Topics  . Smoking status: Current Some Day Smoker -- 0.50 packs/day    Types: Cigarettes, Cigars  . Smokeless tobacco: Former Neurosurgeon    Quit date: 07/15/2012  . Alcohol Use: Yes     Comment: rare     Review of Systems  Constitutional: Negative for fever and chills.  Cardiovascular: Negative for chest pain.  Gastrointestinal: Positive for abdominal pain. Negative for nausea and vomiting.  Neurological: Negative for weakness.  All other systems reviewed and are negative.  Home Medications   Prior to Admission medications    Medication Sig Start Date End Date Taking? Authorizing Provider  dicyclomine (BENTYL) 10 MG capsule Take 10 mg by mouth 4 (four) times daily -  before meals and at bedtime.   Yes Historical Provider, MD  amLODipine (NORVASC) 5 MG tablet Take 5 mg by mouth daily.    Historical Provider, MD  benzonatate (TESSALON) 100 MG capsule Take 1 capsule (100 mg total) by mouth every 8 (eight) hours. 03/17/15   April Palumbo, MD  bimatoprost (LUMIGAN) 0.03 % ophthalmic solution 1 drop at bedtime.    Historical Provider, MD  cyclobenzaprine (FLEXERIL) 10 MG tablet Take 1 tablet (10 mg total) by mouth 2 (two) times daily as needed for muscle spasms. 11/04/13   Shon Baton, MD  Dexlansoprazole (DEXILANT PO) Take by mouth.    Historical Provider, MD  docusate sodium (COLACE) 100 MG capsule Take 1 capsule (100 mg total) by mouth every 12 (twelve) hours. 07/19/15   Rolland Porter, MD  fluticasone (FLONASE) 50 MCG/ACT nasal spray Place 2 sprays into both nostrils daily. 03/17/15   April Palumbo, MD  guaiFENesin 200 MG tablet Take 1 tablet (200 mg total) by mouth every 4 (four) hours as needed for cough or to loosen phlegm. 03/17/15   April Palumbo, MD  HYDROcodone-acetaminophen (NORCO/VICODIN) 5-325 MG per tablet Take 1 tablet by mouth every 6 (six) hours as needed for severe pain. 02/26/15   Gerhard Munch, MD  loratadine (CLARITIN) 10 MG tablet Take 1 tablet (10 mg total) by mouth daily. 03/05/12 02/26/15  April Palumbo, MD  OMEPRAZOLE PO  Take by mouth.    Historical Provider, MD  polyethylene glycol powder (GLYCOLAX/MIRALAX) powder Take 17 g by mouth daily. 07/19/15   Rolland Porter, MD    Allergies  Review of patient's allergies indicates no known allergies.  Triage Vitals: BP 124/89 mmHg  Pulse 91  Temp(Src) 98.4 F (36.9 C) (Oral)  Resp 18  Ht  (1.803 m)  Wt 234 lb (106.142 kg)  BMI 32.65 kg/m2  SpO2 100%  Physical Exam  Constitutional: He is oriented to person, place, and time. He appears well-developed  and well-nourished.  HENT:  Head: Normocephalic and atraumatic.  Eyes: EOM are normal.  Neck: Normal range of motion.  Cardiovascular: Normal rate, regular rhythm, normal heart sounds and intact distal pulses.   2+ and equal radial pulse  Pulmonary/Chest: Effort normal and breath sounds normal. No respiratory distress.  Abdominal: Soft. He exhibits no distension and no mass. There is no tenderness.  No organomegaly  Musculoskeletal: Normal range of motion.  Neurological: He is alert and oriented to person, place, and time.  Skin: Skin is warm and dry.  Psychiatric: He has a normal mood and affect. Judgment normal.  Nursing note and vitals reviewed.   ED Course  Procedures   DIAGNOSTIC STUDIES: Oxygen Saturation is 100% on RA, normal by my interpretation.    COORDINATION OF CARE: 12:05 AM Discussed treatment plan with pt at bedside and pt agreed to plan.  Labs Reviewed - No data to display  Imaging Review No results found.  I personally reviewed and evaluated these images and lab results as a part of my medical decision-making.    MDM   Final diagnoses:  Abdominal pain  Bloated abdomen     I personally performed the services described in this documentation, which was scribed in my presence. The recorded information has been reviewed and is accurate.  Pt with abd pain and bloating. Pain is non specific. Pt is obese, wxam is limited. He has worsening of his symptoms CT-scan of the abdomen ordered, and is neg for acute process. PT is already tolerating po. Will d.c   Derwood Kaplan, MD 08/04/15 0145

## 2015-07-23 NOTE — ED Notes (Signed)
Pt c/o increasing bloating since last week.  He is able to tolerate eating and is having bowel movements, but feels with every meal that he is getting more and more bloated.  No n/v, pt having multiple bowel movements daily.

## 2015-07-23 NOTE — ED Notes (Signed)
Abdominal bloating for over a week. Started after eating a salad. He was treated for the pain and told to take laxatives. He is no better.

## 2015-07-24 ENCOUNTER — Emergency Department (HOSPITAL_BASED_OUTPATIENT_CLINIC_OR_DEPARTMENT_OTHER): Payer: Self-pay

## 2015-07-24 NOTE — ED Notes (Signed)
Pt returned from CT °

## 2015-07-24 NOTE — ED Notes (Signed)
Patient transported to CT 

## 2015-07-24 NOTE — Discharge Instructions (Signed)
CT scan in the ER is normal. Please see Primary doctor or GI doctor.   Abdominal Pain, Adult Many things can cause abdominal pain. Usually, abdominal pain is not caused by a disease and will improve without treatment. It can often be observed and treated at home. Your health care provider will do a physical exam and possibly order blood tests and X-rays to help determine the seriousness of your pain. However, in many cases, more time must pass before a clear cause of the pain can be found. Before that point, your health care provider may not know if you need more testing or further treatment. HOME CARE INSTRUCTIONS Monitor your abdominal pain for any changes. The following actions may help to alleviate any discomfort you are experiencing:  Only take over-the-counter or prescription medicines as directed by your health care provider.  Do not take laxatives unless directed to do so by your health care provider.  Try a clear liquid diet (broth, tea, or water) as directed by your health care provider. Slowly move to a bland diet as tolerated. SEEK MEDICAL CARE IF:  You have unexplained abdominal pain.  You have abdominal pain associated with nausea or diarrhea.  You have pain when you urinate or have a bowel movement.  You experience abdominal pain that wakes you in the night.  You have abdominal pain that is worsened or improved by eating food.  You have abdominal pain that is worsened with eating fatty foods.  You have a fever. SEEK IMMEDIATE MEDICAL CARE IF:  Your pain does not go away within 2 hours.  You keep throwing up (vomiting).  Your pain is felt only in portions of the abdomen, such as the right side or the left lower portion of the abdomen.  You pass bloody or black tarry stools. MAKE SURE YOU:  Understand these instructions.  Will watch your condition.  Will get help right away if you are not doing well or get worse.   This information is not intended to replace  advice given to you by your health care provider. Make sure you discuss any questions you have with your health care provider.   Document Released: 07/09/2005 Document Revised: 06/20/2015 Document Reviewed: 06/08/2013 Elsevier Interactive Patient Education 2016 [employer] Clear Liquid Diet A clear liquid diet is a short-term diet that is prescribed to provide the necessary fluid and basic energy you need when you can have nothing else. The clear liquid diet consists of liquids or solids that will become liquid at room temperature. You should be able to see through the liquid. There are many reasons that you may be restricted to clear liquids, such as:  When you have a sudden-onset (acute) condition that occurs before or after surgery.  To help your body slowly get adjusted to food again after a long period when you were unable to have food.  Replacement of fluids when you have a diarrheal disease.  When you are going to have certain exams, such as a colonoscopy, in which instruments are inserted inside your body to look at parts of your digestive system. WHAT CAN I HAVE? A clear liquid diet does not provide all the nutrients you need. It is important to choose a variety of the following items to get as many nutrients as possible:  Vegetable juices that do not have pulp.  Fruit juices and fruit drinks that do not have pulp.  Coffee (regular or decaffeinated), tea, or soda at the discretion of your health care provider.  Clear bouillon, broth, or strained broth-based soups.  High-protein and flavored gelatins.  Sugar or honey.  Ices or frozen ice pops that do not contain milk. If you are not sure whether you can have certain items, you should ask your health care provider. You may also ask your health care provider if there are any other clear liquid options.   This information is not intended to replace advice given to you by your health care provider. Make sure you discuss any  questions you have with your health care provider.   Document Released: 09/29/2005 Document Revised: 10/04/2013 Document Reviewed: 08/26/2013 Elsevier Interactive Patient Education [employer]

## 2015-07-24 NOTE — ED Notes (Signed)
Pt verbalizes understanding of d/c instructions and denies any further need at this time. 

## 2016-10-21 ENCOUNTER — Encounter (HOSPITAL_BASED_OUTPATIENT_CLINIC_OR_DEPARTMENT_OTHER): Payer: Self-pay | Admitting: *Deleted

## 2016-10-21 ENCOUNTER — Emergency Department (HOSPITAL_BASED_OUTPATIENT_CLINIC_OR_DEPARTMENT_OTHER)
Admission: EM | Admit: 2016-10-21 | Discharge: 2016-10-22 | Disposition: A | Discharge status: Home / Self Care | Payer: Self-pay | Attending: Emergency Medicine | Admitting: Emergency Medicine

## 2016-10-21 DIAGNOSIS — K0510 Chronic gingivitis, plaque induced: Secondary | ICD-10-CM | POA: Insufficient documentation

## 2016-10-21 DIAGNOSIS — F17290 Nicotine dependence, other tobacco product, uncomplicated: Secondary | ICD-10-CM | POA: Insufficient documentation

## 2016-10-21 DIAGNOSIS — K0500 Acute gingivitis, plaque induced: Secondary | ICD-10-CM

## 2016-10-21 DIAGNOSIS — F17210 Nicotine dependence, cigarettes, uncomplicated: Secondary | ICD-10-CM | POA: Insufficient documentation

## 2016-10-21 MED ORDER — AMOXICILLIN 500 MG PO CAPS: 500.0000 mg | ORAL_CAPSULE | Freq: Three times a day (TID) | ORAL | 0 refills | Status: DC

## 2016-10-21 MED ORDER — CHLORHEXIDINE GLUCONATE 0.12 % MT SOLN: 15.0000 mL | Freq: Three times a day (TID) | OROMUCOSAL | 0 refills | Status: DC

## 2016-10-21 NOTE — ED Triage Notes (Signed)
Pt c/o dental pain x 2 days.  

## 2016-10-21 NOTE — ED Notes (Signed)
ED Provider at bedside. 

## 2016-10-21 NOTE — ED Provider Notes (Signed)
[facility]   CSN: [id number] Arrival date & time: 10/21/16  2317  By signing my name below, I, Linna Darner, attest that this documentation has been prepared under the direction and in the presence of physician practitioner, Tilden Fossa, MD. Electronically Signed: Linna Darner, Scribe. 10/21/2016. 11:46 PM.  History   Chief Complaint Chief Complaint  Patient presents with  . Dental Pain    The history is provided by the patient. No language interpreter was used.     HPI Comments: Aaron Weber is a 46 y.o. male who presents to the Emergency Department complaining of sudden onset, constant, throbbing, right upper dental pain beginning yesterday. He reports severe pain and swelling to his right upper gums. He states he was eating chips yesterday and a piece of one became stuck in his right upper gums; he notes he removed the chip and has had constant pain and swelling since. No h/o DM. NKDA. He denies fever, chills, trouble swallowing, sore throat, or any other associated symptoms. He is followed by a dentist.  Past Medical History:  Diagnosis Date  . GERD (gastroesophageal reflux disease)   . Hypertension   . Pleurisy     There are no active problems to display for this patient.   Past Surgical History:  Procedure Laterality Date  . TONSILLECTOMY         Home Medications    Prior to Admission medications   Medication Sig Start Date End Date Taking? Authorizing Provider  amLODipine (NORVASC) 5 MG tablet Take 5 mg by mouth daily.    Historical Provider, MD  amoxicillin (AMOXIL) 500 MG capsule Take 1 capsule (500 mg total) by mouth 3 (three) times daily. 10/21/16   Tilden Fossa, MD  benzonatate (TESSALON) 100 MG capsule Take 1 capsule (100 mg total) by mouth every 8 (eight) hours. 03/17/15   April Palumbo, MD  bimatoprost (LUMIGAN) 0.03 % ophthalmic solution 1 drop at bedtime.    Historical Provider, MD  chlorhexidine (PERIDEX) 0.12 %  solution Use as directed 15 mLs in the mouth or throat 3 (three) times daily between meals. 10/21/16   Tilden Fossa, MD  cyclobenzaprine (FLEXERIL) 10 MG tablet Take 1 tablet (10 mg total) by mouth 2 (two) times daily as needed for muscle spasms. 11/04/13   Shon Baton, MD  Dexlansoprazole (DEXILANT PO) Take by mouth.    Historical Provider, MD  dicyclomine (BENTYL) 10 MG capsule Take 10 mg by mouth 4 (four) times daily -  before meals and at bedtime.    Historical Provider, MD  docusate sodium (COLACE) 100 MG capsule Take 1 capsule (100 mg total) by mouth every 12 (twelve) hours. 07/19/15   Rolland Porter, MD  fluticasone (FLONASE) 50 MCG/ACT nasal spray Place 2 sprays into both nostrils daily. 03/17/15   April Palumbo, MD  guaiFENesin 200 MG tablet Take 1 tablet (200 mg total) by mouth every 4 (four) hours as needed for cough or to loosen phlegm. 03/17/15   April Palumbo, MD  HYDROcodone-acetaminophen (NORCO/VICODIN) 5-325 MG per tablet Take 1 tablet by mouth every 6 (six) hours as needed for severe pain. 02/26/15   Gerhard Munch, MD  loratadine (CLARITIN) 10 MG tablet Take 1 tablet (10 mg total) by mouth daily. 03/05/12 02/26/15  April Palumbo, MD  OMEPRAZOLE PO Take by mouth.    Historical Provider, MD  polyethylene glycol powder (GLYCOLAX/MIRALAX) powder Take 17 g by mouth daily. 07/19/15   Rolland Porter, MD    Family History  History reviewed. No pertinent family history.  Social History Social History  Substance Use Topics  . Smoking status: Current Some Day Smoker    Packs/day: 0.50    Types: Cigarettes, Cigars  . Smokeless tobacco: Former Neurosurgeon    Quit date: 07/15/2012  . Alcohol use Yes     Comment: rare     Allergies   Patient has no known allergies.   Review of Systems Review of Systems  Constitutional: Negative for chills and fever.  HENT: Positive for dental problem (right upper gums) and facial swelling (right upper gums). Negative for sore throat and trouble swallowing.   All  other systems reviewed and are negative.    Physical Exam Updated Vital Signs BP (!) 144/104   Pulse 87   Temp 98.3 F (36.8 C)   Resp 16   Ht 5\' 11"  (1.803 m)   Wt 236 lb (107 kg)   SpO2 96%   BMI 32.92 kg/m   Physical Exam  Constitutional: He is oriented to person, place, and time. He appears well-developed and well-nourished.  HENT:  Head: Normocephalic and atraumatic.  Right Ear: External ear normal.  Left Ear: External ear normal.  Gingival induration and tenderness over the right upper gingiva. No appreciable facial swelling. No focal fluctuance.  Eyes: EOM are normal. Pupils are equal, round, and reactive to light.  Neck: Neck supple.  Cardiovascular: Normal rate and regular rhythm.   Pulmonary/Chest: Effort normal. No respiratory distress.  Neurological: He is alert and oriented to person, place, and time.  Skin: Skin is warm and dry.  Psychiatric: He has a normal mood and affect. His behavior is normal.  Nursing note and vitals reviewed.    ED Treatments / Results  Labs (all labs ordered are listed, but only abnormal results are displayed) Labs Reviewed - No data to display  EKG  EKG Interpretation None       Radiology No results found.  Procedures Procedures (including critical care time)  DIAGNOSTIC STUDIES: Oxygen Saturation is 96% on RA, adequate by my interpretation.    COORDINATION OF CARE: 11:55 PM Discussed treatment plan with pt at bedside and pt agreed to plan.  Medications Ordered in ED Medications - No data to display   Initial Impression / Assessment and Plan / ED Course  I have reviewed the triage vital signs and the nursing notes.  Pertinent labs & imaging results that were available during my care of the patient were reviewed by me and considered in my medical decision making (see chart for details).  Clinical Course     Patient here for evaluation of dental pain. Examination with gingivitis local gingival irritation. No  evidence of dental abscess or serious bacterial infection. Discussed with patient home care for gingivitis witnessed mouthwashes, dentistry follow-up. Will write antibiotics if his symptoms are not responding to mouthwash. Outpatient follow-up and return versus discussed.  Final Clinical Impressions(s) / ED Diagnoses   Final diagnoses:  Gingivitis, acute    New Prescriptions Discharge Medication List as of 10/21/2016 11:55 PM    START taking these medications   Details  amoxicillin (AMOXIL) 500 MG capsule Take 1 capsule (500 mg total) by mouth 3 (three) times daily., Starting Tue 10/21/2016, Print    chlorhexidine (PERIDEX) 0.12 % solution Use as directed 15 mLs in the mouth or throat 3 (three) times daily between meals., Starting Tue 10/21/2016, Print       I personally performed the services described in this documentation, which was scribed  in my presence. The recorded information has been reviewed and is accurate.    Tilden FossaElizabeth Yohance Hathorne, MD 10/22/16 [phone]

## 2016-10-26 ENCOUNTER — Encounter (HOSPITAL_BASED_OUTPATIENT_CLINIC_OR_DEPARTMENT_OTHER): Payer: Self-pay | Admitting: Emergency Medicine

## 2016-10-26 ENCOUNTER — Emergency Department (HOSPITAL_BASED_OUTPATIENT_CLINIC_OR_DEPARTMENT_OTHER): Payer: Self-pay

## 2016-10-26 ENCOUNTER — Emergency Department (HOSPITAL_BASED_OUTPATIENT_CLINIC_OR_DEPARTMENT_OTHER)
Admission: EM | Admit: 2016-10-26 | Discharge: 2016-10-26 | Disposition: A | Discharge status: Home / Self Care | Payer: Self-pay | Attending: Emergency Medicine | Admitting: Emergency Medicine

## 2016-10-26 DIAGNOSIS — I10 Essential (primary) hypertension: Secondary | ICD-10-CM | POA: Insufficient documentation

## 2016-10-26 DIAGNOSIS — Z792 Long term (current) use of antibiotics: Secondary | ICD-10-CM | POA: Insufficient documentation

## 2016-10-26 DIAGNOSIS — J40 Bronchitis, not specified as acute or chronic: Secondary | ICD-10-CM | POA: Insufficient documentation

## 2016-10-26 DIAGNOSIS — F17210 Nicotine dependence, cigarettes, uncomplicated: Secondary | ICD-10-CM | POA: Insufficient documentation

## 2016-10-26 DIAGNOSIS — F17290 Nicotine dependence, other tobacco product, uncomplicated: Secondary | ICD-10-CM | POA: Insufficient documentation

## 2016-10-26 DIAGNOSIS — Z79899 Other long term (current) drug therapy: Secondary | ICD-10-CM | POA: Insufficient documentation

## 2016-10-26 LAB — COMPREHENSIVE METABOLIC PANEL
ALT: 20 U/L (ref 17–63)
AST: 20 U/L (ref 15–41)
Albumin: 4.1 g/dL (ref 3.5–5.0)
Alkaline Phosphatase: 86 U/L (ref 38–126)
Anion gap: 8 (ref 5–15)
BUN: 11 mg/dL (ref 6–20)
CHLORIDE: 101 mmol/L (ref 101–111)
CO2: 26 mmol/L (ref 22–32)
CREATININE: 0.92 mg/dL (ref 0.61–1.24)
Calcium: 9.2 mg/dL (ref 8.9–10.3)
GFR calc non Af Amer: >60 mL/min (ref >60)
Glucose, Bld: 172 mg/dL — ABNORMAL HIGH (ref 65–99)
Potassium: 3.8 mmol/L (ref 3.5–5.1)
SODIUM: 135 mmol/L (ref 135–145)
Total Bilirubin: 0.5 mg/dL (ref 0.3–1.2)
Total Protein: 8.1 g/dL (ref 6.5–8.1)

## 2016-10-26 LAB — CBC WITH DIFFERENTIAL/PLATELET
Basophils Absolute: 0 10*3/uL (ref 0.0–0.1)
Basophils Relative: 0 %
EOS ABS: 0.2 10*3/uL (ref 0.0–0.7)
EOS PCT: 2 %
HCT: 48.1 % (ref 39.0–52.0)
Hemoglobin: 16.4 g/dL (ref 13.0–17.0)
Lymphocytes Relative: 35 %
Lymphs Abs: 2.7 10*3/uL (ref 0.7–4.0)
MCH: 30.1 pg (ref 26.0–34.0)
MCHC: 34.1 g/dL (ref 30.0–36.0)
MCV: 88.3 fL (ref 78.0–100.0)
Monocytes Absolute: 0.7 10*3/uL (ref 0.1–1.0)
Monocytes Relative: 9 %
Neutro Abs: 4.1 10*3/uL (ref 1.7–7.7)
Neutrophils Relative %: 54 %
PLATELETS: 335 10*3/uL (ref 150–400)
RBC: 5.45 MIL/uL (ref 4.22–5.81)
RDW: 13.9 % (ref 11.5–15.5)
WBC: 7.6 10*3/uL (ref 4.0–10.5)

## 2016-10-26 LAB — TROPONIN I: Troponin I: <0.03 ng/mL (ref <0.03)

## 2016-10-26 MED ORDER — BENZONATATE 100 MG PO CAPS: 100.0000 mg | ORAL_CAPSULE | Freq: Three times a day (TID) | ORAL | 0 refills | Status: DC | PRN

## 2016-10-26 MED ORDER — GUAIFENESIN ER 600 MG PO TB12: 600.0000 mg | ORAL_TABLET | Freq: Two times a day (BID) | ORAL | 0 refills | Status: DC

## 2016-10-26 NOTE — ED Notes (Signed)
Pt made aware to return if symptoms worsen or if any life threatening symptoms occur.   

## 2016-10-26 NOTE — ED Provider Notes (Signed)
[facility]   CSN: [id number] Arrival date & time: 10/26/16  2038  By signing my name below, I, Javier Docker, attest that this documentation has been prepared under the direction and in the presence of Cheri Fowler, PA-C. Electronically Signed: Javier Docker, ER Scribe. 05/24/2016. 9:54 PM.  History   Chief Complaint Chief Complaint  Patient presents with  . Chest Pain  . Cough   The history is provided by the patient. No language interpreter was used.   HPI Comments: Aaron Weber is a 46 y.o. male who presents to the Emergency Department complaining of 24 hours of productive cough (green mucous), nasal congestion, scratchy throat with associated chest tightness and back pain with deep breathing. He denies fever, chills, unilateral leg swelling. He denies PMHx of asthma/COPD, DVT/PE, or cardiac history. He has a family hx of heart disease (grandfather). Symptoms are NOT exertional. 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 He has taken zycam and mucinex. His co-driver had pneumonia in the last week. He smokes 1 PPD.    Past Medical History:  Diagnosis Date  . GERD (gastroesophageal reflux disease)   . Hypertension   . Pleurisy     There are no active problems to display for this patient.   Past Surgical History:  Procedure Laterality Date  . TONSILLECTOMY         Home Medications    Prior to Admission medications   Medication Sig Start Date End Date Taking? Authorizing Provider  amLODipine (NORVASC) 5 MG tablet Take 5 mg by mouth daily.   Yes Historical Provider, MD  chlorhexidine (PERIDEX) 0.12 % solution Use as directed 15 mLs in the mouth or throat 3 (three) times daily between meals. 10/21/16  Yes Tilden Fossa, MD  loratadine (CLARITIN) 10 MG tablet Take 1 tablet (10 mg total) by mouth daily. 03/05/12 10/26/16 Yes April Palumbo, MD  OMEPRAZOLE PO Take by mouth.   Yes Historical Provider, MD  amoxicillin (AMOXIL) 500 MG  capsule Take 1 capsule (500 mg total) by mouth 3 (three) times daily. 10/21/16   Tilden Fossa, MD  benzonatate (TESSALON) 100 MG capsule Take 1 capsule (100 mg total) by mouth 3 (three) times daily as needed for cough. 10/26/16   Xylina Rhoads, PA-C  bimatoprost (LUMIGAN) 0.03 % ophthalmic solution 1 drop at bedtime.    Historical Provider, MD  cyclobenzaprine (FLEXERIL) 10 MG tablet Take 1 tablet (10 mg total) by mouth 2 (two) times daily as needed for muscle spasms. 11/04/13   Shon Baton, MD  Dexlansoprazole (DEXILANT PO) Take by mouth.    Historical Provider, MD  dicyclomine (BENTYL) 10 MG capsule Take 10 mg by mouth 4 (four) times daily -  before meals and at bedtime.    Historical Provider, MD  docusate sodium (COLACE) 100 MG capsule Take 1 capsule (100 mg total) by mouth every 12 (twelve) hours. 07/19/15   Rolland Porter, MD  fluticasone (FLONASE) 50 MCG/ACT nasal spray Place 2 sprays into both nostrils daily. 03/17/15   April Palumbo, MD  guaiFENesin (MUCINEX) 600 MG 12 hr tablet Take 1 tablet (600 mg total) by mouth 2 (two) times daily. 10/26/16   Cheri Fowler, PA-C  HYDROcodone-acetaminophen (NORCO/VICODIN) 5-325 MG per tablet Take 1 tablet by mouth every 6 (six) hours as needed for severe pain. 02/26/15   Gerhard Munch, MD  polyethylene glycol powder (GLYCOLAX/MIRALAX) powder Take 17 g by mouth daily. 07/19/15   Rolland Porter, MD  Family History No family history on file.  Social History Social History  Substance Use Topics  . Smoking status: Current Some Day Smoker    Packs/day: 0.50    Types: Cigarettes, Cigars  . Smokeless tobacco: Former NeurosurgeonUser    Quit date: 07/15/2012  . Alcohol use Yes     Comment: rare     Allergies   Patient has no known allergies.   Review of Systems Review of Systems  Respiratory: Positive for cough and chest tightness.    A complete 10 system review of systems was obtained and all systems are negative except as noted in the HPI and PMH.    Physical  Exam Updated Vital Signs BP 144/88 (BP Location: Left Arm)   Pulse 91   Temp 98.4 F (36.9 C) (Oral)   Resp 19   Ht 5\' 11"  (1.803 m)   Wt 107 kg   SpO2 98%   BMI 32.92 kg/m   Physical Exam  Constitutional: He is oriented to person, place, and time. He appears well-developed and well-nourished. He is active.  Non-toxic appearance. He does not have a sickly appearance. He does not appear ill.  HENT:  Head: Normocephalic and atraumatic.  Right Ear: Tympanic membrane and external ear normal. Tympanic membrane is not erythematous and not bulging.  Left Ear: Tympanic membrane and external ear normal. Tympanic membrane is not erythematous and not bulging.  Nose: Nose normal.  Mouth/Throat: Uvula is midline, oropharynx is clear and moist and mucous membranes are normal. No trismus in the jaw. No uvula swelling. No oropharyngeal exudate, posterior oropharyngeal edema, posterior oropharyngeal erythema or tonsillar abscesses.  Neck: Normal range of motion. Neck supple.  No nuchal rigidity.   Cardiovascular: Normal rate and regular rhythm.   Lower extremities symmetric.   Pulmonary/Chest: Effort normal and breath sounds normal. No respiratory distress. He has no wheezes. He has no rales.  Abdominal: Soft. Bowel sounds are normal. He exhibits no distension. There is no tenderness.  Musculoskeletal: Normal range of motion.  Lymphadenopathy:    He has no cervical adenopathy.  Neurological: He is alert and oriented to person, place, and time.  Skin: Skin is warm and dry.  Psychiatric: He has a normal mood and affect. His behavior is normal.    ED Treatments / Results  DIAGNOSTIC STUDIES: Oxygen Saturation is 98% on RA, normal by my interpretation.    COORDINATION OF CARE: 9:54 PM Discussed treatment plan with pt at bedside and pt agreed to plan.  Labs (all labs ordered are listed, but only abnormal results are displayed) Labs Reviewed  COMPREHENSIVE METABOLIC PANEL - Abnormal; Notable  for the following:       Result Value   Glucose, Bld 172 (*)    All other components within normal limits  CBC WITH DIFFERENTIAL/PLATELET  TROPONIN I    EKG  EKG Interpretation  Date/Time:  Sunday October 26 2016 20:48:59 EST Ventricular Rate:  84 PR Interval:  154 QRS Duration: 98 QT Interval:  350 QTC Calculation: 413 R Axis:   40 Text Interpretation:  Normal sinus rhythm Nonspecific T wave abnormality Abnormal ECG No significant change since last tracing Confirmed by Ethelda ChickJACUBOWITZ  MD, SAM [phone]([id number]) on 10/26/2016 9:15:01 PM       Radiology Dg Chest 2 View  Result Date: 10/26/2016 CLINICAL DATA:  Pt c/o congestion and central chest pain, worse with coughing for past "few days." Pt also reports cough and generalized weakness. HX HTN, GERD EXAM: CHEST  2 VIEW COMPARISON:  07/19/2015 FINDINGS: The heart size and mediastinal contours are within normal limits. Both lungs are clear. No pleural effusion or pneumothorax. The visualized skeletal structures are unremarkable. IMPRESSION: No active cardiopulmonary disease. Electronically Signed   By: Amie Portland M.D.   On: 10/26/2016 21:13    Procedures Procedures (including critical care time)  Medications Ordered in ED Medications - No data to display   Initial Impression / Assessment and Plan / ED Course  I have reviewed the triage vital signs and the nursing notes.  Pertinent labs & imaging results that were available during my care of the patient were reviewed by me and considered in my medical decision making (see chart for details).  Clinical Course    Patient with findings c/w bronchitis.  Do not suspect ACS, PE, or dissection based on labs, CXR, and work up. Advised to stop smoking.  No wheezing on exam, no need for prednisone or albuterol.  Plan to discharge home with tessalon perles, mucinex.  Recommend motrin and tylenol for pain.  Follow up [facility] to establish PCP.  Return precautions discussed.  Stable for discharge.    Final Clinical Impressions(s) / ED Diagnoses   Final diagnoses:  Bronchitis    New Prescriptions New Prescriptions   BENZONATATE (TESSALON) 100 MG CAPSULE    Take 1 capsule (100 mg total) by mouth 3 (three) times daily as needed for cough.   GUAIFENESIN (MUCINEX) 600 MG 12 HR TABLET    Take 1 tablet (600 mg total) by mouth 2 (two) times daily.     I personally performed the services described in this documentation, which was scribed in my presence. The recorded information has been reviewed and is accurate.        Cheri Fowler, PA-C 10/26/16 2211    Doug Sou, MD 10/26/16 [phone]

## 2016-10-26 NOTE — Discharge Instructions (Signed)
Your symptoms are likely viral in nature.  Take Mucinex and tessalon perles for cough.  Take motrin and tylenol for pain.  Stop smoking.  [facility] to [facility] a primary care doctor.  Return to the ED for fever, chest pain, shortness of breath, or any new or concerning symptoms.

## 2016-10-26 NOTE — ED Triage Notes (Signed)
Pt c/o congestion and central chest pain, worse with coughing for past "few days."  Pt also reports cough and generalized weakness.

## 2016-11-05 ENCOUNTER — Encounter (HOSPITAL_BASED_OUTPATIENT_CLINIC_OR_DEPARTMENT_OTHER): Payer: Self-pay

## 2016-11-05 DIAGNOSIS — I10 Essential (primary) hypertension: Secondary | ICD-10-CM | POA: Insufficient documentation

## 2016-11-05 DIAGNOSIS — F17210 Nicotine dependence, cigarettes, uncomplicated: Secondary | ICD-10-CM | POA: Insufficient documentation

## 2016-11-05 DIAGNOSIS — F17290 Nicotine dependence, other tobacco product, uncomplicated: Secondary | ICD-10-CM | POA: Insufficient documentation

## 2016-11-05 DIAGNOSIS — J209 Acute bronchitis, unspecified: Secondary | ICD-10-CM | POA: Insufficient documentation

## 2016-11-05 NOTE — ED Triage Notes (Signed)
Pt c/o cough and congestion x2wk

## 2016-11-06 ENCOUNTER — Emergency Department (HOSPITAL_BASED_OUTPATIENT_CLINIC_OR_DEPARTMENT_OTHER)
Admission: EM | Admit: 2016-11-06 | Discharge: 2016-11-06 | Disposition: A | Discharge status: Home / Self Care | Payer: Self-pay | Attending: Emergency Medicine | Admitting: Emergency Medicine

## 2016-11-06 DIAGNOSIS — J209 Acute bronchitis, unspecified: Secondary | ICD-10-CM

## 2016-11-06 MED ORDER — DOXYCYCLINE HYCLATE 100 MG PO TABS
100.0000 mg | ORAL_TABLET | Freq: Once | ORAL | Status: AC
  Administered 2016-11-06: 100 mg via ORAL
  Filled 2016-11-06: qty 1

## 2016-11-06 MED ORDER — AEROCHAMBER PLUS FLO-VU MEDIUM MISC
1.0000 | Freq: Once | Status: AC
  Administered 2016-11-06: 1
  Filled 2016-11-06: qty 1

## 2016-11-06 MED ORDER — ALBUTEROL SULFATE HFA 108 (90 BASE) MCG/ACT IN AERS: 2.0000 | INHALATION_SPRAY | RESPIRATORY_TRACT | Status: DC | PRN

## 2016-11-06 MED ORDER — DOXYCYCLINE HYCLATE 100 MG PO CAPS: 100.0000 mg | ORAL_CAPSULE | Freq: Two times a day (BID) | ORAL | 0 refills | Status: DC

## 2016-11-06 MED ORDER — HYDROCOD POLST-CPM POLST ER 10-8 MG/5ML PO SUER: 5.0000 mL | Freq: Two times a day (BID) | ORAL | 0 refills | Status: DC | PRN

## 2016-11-06 NOTE — ED Notes (Signed)
ED Provider at bedside. 

## 2016-11-06 NOTE — ED Provider Notes (Signed)
[facility] Provider Note: [name], MD, FACEP  CSN: [id number] MRN: [id number] ARRIVAL: 11/05/16 at 2346 ROOM: MH10/MH10   CHIEF COMPLAINT  Cough   HISTORY OF PRESENT ILLNESS  Aaron Weber is a 46 y.o. male with about a two-week history of a respiratory infection. He was seen on the 14th of this month and diagnosed with bronchitis. He was treated with benzonatate and guaifenesin. Despite this he continues to have a cough which he states is productive of profuse green phlegm, particularly in the mornings. He also feels his chest is tight and is having difficulty breathing at times. He has an inhaler at home but states this has not helped; he states the inhaler only makes him feel jittery. He has not had a fever.   Past Medical History:  Diagnosis Date  . GERD (gastroesophageal reflux disease)   . Hypertension   . Pleurisy     Past Surgical History:  Procedure Laterality Date  . TONSILLECTOMY      No family history on file.  Social History  Substance Use Topics  . Smoking status: Current Some Day Smoker    Packs/day: 0.50    Types: Cigarettes, Cigars  . Smokeless tobacco: Former Neurosurgeon    Quit date: 07/15/2012  . Alcohol use Yes     Comment: rare    Prior to Admission medications   Medication Sig Start Date End Date Taking? Authorizing Provider  amLODipine (NORVASC) 5 MG tablet Take 5 mg by mouth daily.    Historical Provider, MD  amoxicillin (AMOXIL) 500 MG capsule Take 1 capsule (500 mg total) by mouth 3 (three) times daily. 10/21/16   Tilden Fossa, MD  benzonatate (TESSALON) 100 MG capsule Take 1 capsule (100 mg total) by mouth 3 (three) times daily as needed for cough. 10/26/16   Kayla Rose, PA-C  bimatoprost (LUMIGAN) 0.03 % ophthalmic solution 1 drop at bedtime.    Historical Provider, MD  chlorhexidine (PERIDEX) 0.12 % solution Use as directed 15 mLs in the mouth or throat 3 (three) times daily between meals. 10/21/16   Tilden Fossa, MD    cyclobenzaprine (FLEXERIL) 10 MG tablet Take 1 tablet (10 mg total) by mouth 2 (two) times daily as needed for muscle spasms. 11/04/13   Shon Baton, MD  Dexlansoprazole (DEXILANT PO) Take by mouth.    Historical Provider, MD  dicyclomine (BENTYL) 10 MG capsule Take 10 mg by mouth 4 (four) times daily -  before meals and at bedtime.    Historical Provider, MD  docusate sodium (COLACE) 100 MG capsule Take 1 capsule (100 mg total) by mouth every 12 (twelve) hours. 07/19/15   Rolland Porter, MD  fluticasone (FLONASE) 50 MCG/ACT nasal spray Place 2 sprays into both nostrils daily. 03/17/15   April Palumbo, MD  guaiFENesin (MUCINEX) 600 MG 12 hr tablet Take 1 tablet (600 mg total) by mouth 2 (two) times daily. 10/26/16   Cheri Fowler, PA-C  HYDROcodone-acetaminophen (NORCO/VICODIN) 5-325 MG per tablet Take 1 tablet by mouth every 6 (six) hours as needed for severe pain. 02/26/15   Gerhard Munch, MD  loratadine (CLARITIN) 10 MG tablet Take 1 tablet (10 mg total) by mouth daily. 03/05/12 10/26/16  April Palumbo, MD  OMEPRAZOLE PO Take by mouth.    Historical Provider, MD  polyethylene glycol powder (GLYCOLAX/MIRALAX) powder Take 17 g by mouth daily. 07/19/15   Rolland Porter, MD    Allergies Patient has no known allergies.   REVIEW OF SYSTEMS  Negative  except as noted here or in the History of Present Illness.   PHYSICAL EXAMINATION  Initial Vital Signs Blood pressure 125/89, pulse 76, temperature 98.1 F (36.7 C), temperature source Oral, resp. rate 16, height 5\' 11"  (1.803 m), weight 236 lb (107 kg), SpO2 95 %.  Examination General: Well-developed, well-nourished male in no acute distress; appearance consistent with age of record HENT: normocephalic; atraumatic Eyes: pupils equal, round and reactive to light; extraocular muscles intact Neck: supple Heart: regular rate and rhythm Lungs: clear to auscultation bilaterally; anterior chest wall tenderness Abdomen: soft; nondistended; nontender; no  masses or hepatosplenomegaly; bowel sounds present Extremities: No deformity; full range of motion; pulses normal Neurologic: Awake, alert and oriented; motor function intact in all extremities and symmetric; no facial droop Skin: Warm and dry Psychiatric: Normal mood and affect   RESULTS  Summary of this visit's results, reviewed by myself:   EKG Interpretation  Date/Time:    Ventricular Rate:    PR Interval:    QRS Duration:   QT Interval:    QTC Calculation:   R Axis:     Text Interpretation:        Laboratory Studies: No results found for this or any previous visit (from the past 24 hour(s)). Imaging Studies: No results found.  ED COURSE  Nursing notes and initial vitals signs, including pulse oximetry, reviewed.  Vitals:   11/05/16 2351 11/06/16 0248  BP: (!) 155/101 125/89  Pulse: 83 76  Resp: 20 16  Temp: 98.1 F (36.7 C)   TempSrc: Oral   SpO2: 100% 95%  Weight: 236 lb (107 kg)   Height: 5\' 11"  (1.803 m)    3:07 AM We'll treat for persistent bronchitis. We will have respiratory therapy give him an AeroChamber and instructed him in better inhaler technique.  PROCEDURES    ED DIAGNOSES     ICD-9-CM ICD-10-CM   1. Acute bronchitis with bronchospasm 466.0 J20.9        Paula LibraJohn Ramona Slinger, MD 11/06/16 [phone]

## 2016-11-30 ENCOUNTER — Emergency Department (HOSPITAL_BASED_OUTPATIENT_CLINIC_OR_DEPARTMENT_OTHER)
Admission: EM | Admit: 2016-11-30 | Discharge: 2016-12-01 | Disposition: A | Discharge status: Home / Self Care | Payer: Self-pay | Attending: Emergency Medicine | Admitting: Emergency Medicine

## 2016-11-30 ENCOUNTER — Encounter (HOSPITAL_BASED_OUTPATIENT_CLINIC_OR_DEPARTMENT_OTHER): Payer: Self-pay | Admitting: Emergency Medicine

## 2016-11-30 DIAGNOSIS — T7840XA Allergy, unspecified, initial encounter: Secondary | ICD-10-CM | POA: Insufficient documentation

## 2016-11-30 DIAGNOSIS — F17290 Nicotine dependence, other tobacco product, uncomplicated: Secondary | ICD-10-CM | POA: Insufficient documentation

## 2016-11-30 DIAGNOSIS — I10 Essential (primary) hypertension: Secondary | ICD-10-CM | POA: Insufficient documentation

## 2016-11-30 DIAGNOSIS — F17210 Nicotine dependence, cigarettes, uncomplicated: Secondary | ICD-10-CM | POA: Insufficient documentation

## 2016-11-30 NOTE — ED Triage Notes (Signed)
Patient states that he went to a friends house and he started to have itching after that. The patient has reddness areas with swelling noted to his bilateral arms. Denies any SOB, or Chest pain

## 2016-11-30 NOTE — ED Provider Notes (Signed)
[facility] Provider Note   CSN: [id number] Arrival date & time: 11/30/16  2246  By signing my name below, I, Rosario Adie, attest that this documentation has been prepared under the direction and in the presence of Francisco Espina, [place].  Electronically Signed: Rosario Adie, ED Scribe. 11/30/16. 11:30 PM.  History   Chief Complaint Chief Complaint  Patient presents with  . Rash   The history is provided by the patient. No language interpreter was used.    HPI Comments: Aaron Weber is a 46 y.o. male with a h/o GERD, HTN, and pleurisy, who presents to the Emergency Department complaining of gradually worsening, pruritic rash to the bilateral upper extremities beginning prior to arrival. Per pt, he was at a friend's house tonight when his rash began. He notes that he is allergic to canine dander confirmed with an allergist, and his friend had previously had a dog two weeks ago. He notes that the areas have been sore and with some mild swelling as well. No new soaps, lotions, detergents, foods, plants, or medications otherwise. No recent travel or hiking. No h/o similar rashes. No other noted allergies. Pt took Benadryl at home twice, with his last dose 4 hours ago, without relief of his symptoms. No other individuals who were in the house experienced similar symptoms. He denies oral swelling, sensation of tongue or throat swelling, difficulty swallowing, dyspnea, sore throat, abdominal pain, nausea, vomiting, diarrhea, chest pain, shortness of breath, urgency, frequency, hematuria, dysuria, difficulty urinating, or any other associated symptoms.   Past Medical History:  Diagnosis Date  . GERD (gastroesophageal reflux disease)   . Hypertension   . Pleurisy    There are no active problems to display for this patient.  Past Surgical History:  Procedure Laterality Date  . TONSILLECTOMY      Home Medications    Prior to Admission medications   Medication  Sig Start Date End Date Taking? Authorizing Provider  amLODipine (NORVASC) 5 MG tablet Take 5 mg by mouth daily.    Historical Provider, MD  benzonatate (TESSALON) 100 MG capsule Take 1 capsule (100 mg total) by mouth 3 (three) times daily as needed for cough. 10/26/16   Kayla Rose, PA-C  bimatoprost (LUMIGAN) 0.03 % ophthalmic solution 1 drop at bedtime.    Historical Provider, MD  chlorhexidine (PERIDEX) 0.12 % solution Use as directed 15 mLs in the mouth or throat 3 (three) times daily between meals. 10/21/16   Tilden Fossa, MD  chlorpheniramine-HYDROcodone [facility]) 10-8 MG/5ML SUER Take 5 mLs by mouth every 12 (twelve) hours as needed. 11/06/16   John Molpus, MD  cyclobenzaprine (FLEXERIL) 10 MG tablet Take 1 tablet (10 mg total) by mouth 2 (two) times daily as needed for muscle spasms. 11/04/13   Shon Baton, MD  Dexlansoprazole (DEXILANT PO) Take by mouth.    Historical Provider, MD  dicyclomine (BENTYL) 10 MG capsule Take 10 mg by mouth 4 (four) times daily -  before meals and at bedtime.    Historical Provider, MD  docusate sodium (COLACE) 100 MG capsule Take 1 capsule (100 mg total) by mouth every 12 (twelve) hours. 07/19/15   Rolland Porter, MD  doxycycline (VIBRAMYCIN) 100 MG capsule Take 1 capsule (100 mg total) by mouth 2 (two) times daily. One po bid x 7 days 11/06/16   Paula Libra, MD  fluticasone [facility]) 50 MCG/ACT nasal spray Place 2 sprays into both nostrils daily. 03/17/15   Cy Blamer, MD  guaiFENesin (MUCINEX) 600 MG 12 hr tablet Take 1 tablet (600 mg total) by mouth 2 (two) times daily. 10/26/16   Cheri FowlerKayla Rose, PA-C  loratadine (CLARITIN) 10 MG tablet Take 1 tablet (10 mg total) by mouth daily. 03/05/12 10/26/16  April Palumbo, MD  OMEPRAZOLE PO Take by mouth.    Historical Provider, MD  polyethylene glycol powder (GLYCOLAX/MIRALAX) powder Take 17 g by mouth daily. 07/19/15   Rolland PorterMark James, MD   Family History History reviewed. No pertinent family history.  Social  History Social History  Substance Use Topics  . Smoking status: Current Some Day Smoker    Packs/day: 0.50    Types: Cigarettes, Cigars  . Smokeless tobacco: Former NeurosurgeonUser    Quit date: 07/15/2012  . Alcohol use Yes     Comment: rare   Allergies   Patient has no known allergies.  Review of Systems Review of Systems  HENT: Negative for sore throat and trouble swallowing.        Negative for oral swelling, sensation of throat or tongue swelling.   Respiratory: Negative for shortness of breath.   Cardiovascular: Negative for chest pain.  Gastrointestinal: Negative for abdominal pain, diarrhea, nausea and vomiting.  Genitourinary: Negative for difficulty urinating, dysuria, frequency, hematuria and urgency.  Skin: Positive for rash.  All other systems reviewed and are negative.  Physical Exam Updated Vital Signs BP (!) 137/104 (BP Location: Right Arm)   Pulse 91   Temp 98 F (36.7 C) (Oral)   Resp 18   Ht 5\' 11"  (1.803 m)   Wt 107 kg   SpO2 100%   BMI 32.92 kg/m   Physical Exam  Constitutional: He appears well-developed and well-nourished. No distress.  HENT:  Head: Normocephalic and atraumatic.  Eyes: Conjunctivae are normal.  Neck: Normal range of motion.  Cardiovascular: Normal rate.   Pulmonary/Chest: Effort normal.  Abdominal: He exhibits no distension.  Musculoskeletal: Normal range of motion.  Neurological: He is alert.  Skin: Skin is warm and dry. No erythema. No pallor.  No hives present. Mild swelling to bilateral forearms. No obvious erythema.   Psychiatric: He has a normal mood and affect. His behavior is normal.  Nursing note and vitals reviewed.  ED Treatments / Results  DIAGNOSTIC STUDIES: Oxygen Saturation is 100% on RA, normal by my interpretation.   COORDINATION OF CARE: 11:28 PM-Discussed next steps with pt. Pt verbalized understanding and is agreeable with the plan.   Labs (all labs ordered are listed, but only abnormal results are  displayed) Labs Reviewed - No data to display  EKG  EKG Interpretation None      Radiology No results found.  Procedures Procedures   Medications Ordered in ED Medications  predniSONE (DELTASONE) tablet 60 mg (60 mg Oral Given 12/01/16 0021)  famotidine (PEPCID) tablet 50 mg (50 mg Oral Given 12/01/16 0021)  diphenhydrAMINE (BENADRYL) capsule 50 mg (50 mg Oral Given 12/01/16 0021)  loratadine (CLARITIN) tablet 10 mg (10 mg Oral Given 12/01/16 0131)    Initial Impression / Assessment and Plan / ED Course  I have reviewed the triage vital signs and the nursing notes.  Pertinent labs & imaging results that were available during my care of the patient were reviewed by me and considered in my medical decision making (see chart for details).    Patient here with apparent mild allergic reaction. Patient is afebrile, no apparent distress, vital signs are stable here. He has no trouble breathing or feeling secretions here in ED. No  respiratory distress. He denies the feeling of throat closing. Heart and lung sounds are clear. Abdomen soft and nontender. Patient has some minimal swelling on his forearms bilaterally. Patient will be given prednisone, Zantac, and Benadryl here in the emergency department. Patient will be observed.  At shift change care was transferred to Dr. Manus Gunning who will follow pending studies, re-evaulate and determine disposition.     Final Clinical Impressions(s) / ED Diagnoses   Final diagnoses:  None   New Prescriptions New Prescriptions   No medications on file   I personally performed the services described in this documentation, which was scribed in my presence. The recorded information has been reviewed and is accurate.    [address] [place], [place] 12/01/16 0201    Glynn Octave, MD 12/01/16 [phone]

## 2016-11-30 NOTE — ED Notes (Signed)
EDPA into room, prior to RN assessment, see PA notes, pending orders.   

## 2016-12-01 MED ORDER — DIPHENHYDRAMINE HCL 25 MG PO TABS: 25.0000 mg | ORAL_TABLET | Freq: Four times a day (QID) | ORAL | 0 refills | Status: DC

## 2016-12-01 MED ORDER — FAMOTIDINE 20 MG PO TABS: 20.0000 mg | ORAL_TABLET | Freq: Two times a day (BID) | ORAL | 0 refills | Status: DC

## 2016-12-01 MED ORDER — DIPHENHYDRAMINE HCL 25 MG PO CAPS
50.0000 mg | ORAL_CAPSULE | Freq: Once | ORAL | Status: AC
  Administered 2016-12-01: 50 mg via ORAL
  Filled 2016-12-01: qty 2

## 2016-12-01 MED ORDER — PREDNISONE 50 MG PO TABS: ORAL_TABLET | ORAL | 0 refills | Status: DC

## 2016-12-01 MED ORDER — LORATADINE 10 MG PO TABS
10.0000 mg | ORAL_TABLET | Freq: Once | ORAL | Status: AC
  Administered 2016-12-01: 10 mg via ORAL

## 2016-12-01 MED ORDER — LORATADINE 10 MG PO TABS
ORAL_TABLET | ORAL | Status: AC
  Filled 2016-12-01: qty 1

## 2016-12-01 MED ORDER — FAMOTIDINE 20 MG PO TABS
50.0000 mg | ORAL_TABLET | Freq: Once | ORAL | Status: AC
  Administered 2016-12-01: 50 mg via ORAL
  Filled 2016-12-01: qty 3

## 2016-12-01 MED ORDER — PREDNISONE 50 MG PO TABS
60.0000 mg | ORAL_TABLET | Freq: Once | ORAL | Status: AC
  Administered 2016-12-01: 60 mg via ORAL
  Filled 2016-12-01: qty 1

## 2016-12-01 NOTE — ED Notes (Signed)
States, "think I am reacting to being around dogs"

## 2016-12-01 NOTE — ED Notes (Signed)
EDPA into room 

## 2016-12-01 NOTE — ED Notes (Signed)
No changes, reports "still itching, new area on L elbow".

## 2016-12-01 NOTE — Discharge Instructions (Signed)
Take the antihistamines as prescribed. Followup with your doctor. Return to the ED if you develop shortness of breath, chest pain, swelling of tongue or throat, or any other concerns.

## 2017-01-14 ENCOUNTER — Emergency Department (HOSPITAL_BASED_OUTPATIENT_CLINIC_OR_DEPARTMENT_OTHER)
Admission: EM | Admit: 2017-01-14 | Discharge: 2017-01-14 | Disposition: A | Discharge status: Home / Self Care | Payer: No Typology Code available for payment source | Attending: Emergency Medicine | Admitting: Emergency Medicine

## 2017-01-14 ENCOUNTER — Encounter (HOSPITAL_BASED_OUTPATIENT_CLINIC_OR_DEPARTMENT_OTHER): Payer: Self-pay | Admitting: *Deleted

## 2017-01-14 DIAGNOSIS — Z79899 Other long term (current) drug therapy: Secondary | ICD-10-CM | POA: Diagnosis not present

## 2017-01-14 DIAGNOSIS — T148XXA Other injury of unspecified body region, initial encounter: Secondary | ICD-10-CM

## 2017-01-14 DIAGNOSIS — F17210 Nicotine dependence, cigarettes, uncomplicated: Secondary | ICD-10-CM | POA: Insufficient documentation

## 2017-01-14 DIAGNOSIS — Y92410 Unspecified street and highway as the place of occurrence of the external cause: Secondary | ICD-10-CM | POA: Diagnosis not present

## 2017-01-14 DIAGNOSIS — Y999 Unspecified external cause status: Secondary | ICD-10-CM | POA: Diagnosis not present

## 2017-01-14 DIAGNOSIS — F17290 Nicotine dependence, other tobacco product, uncomplicated: Secondary | ICD-10-CM | POA: Diagnosis not present

## 2017-01-14 DIAGNOSIS — S29012A Strain of muscle and tendon of back wall of thorax, initial encounter: Secondary | ICD-10-CM | POA: Insufficient documentation

## 2017-01-14 DIAGNOSIS — Y939 Activity, unspecified: Secondary | ICD-10-CM | POA: Diagnosis not present

## 2017-01-14 DIAGNOSIS — S0990XA Unspecified injury of head, initial encounter: Secondary | ICD-10-CM | POA: Diagnosis present

## 2017-01-14 DIAGNOSIS — I10 Essential (primary) hypertension: Secondary | ICD-10-CM | POA: Insufficient documentation

## 2017-01-14 MED ORDER — HYDROCODONE-ACETAMINOPHEN 5-325 MG PO TABS: 2.0000 | ORAL_TABLET | Freq: Four times a day (QID) | ORAL | 0 refills | Status: DC | PRN

## 2017-01-14 MED ORDER — ONDANSETRON 4 MG PO TBDP
4.0000 mg | ORAL_TABLET | Freq: Once | ORAL | Status: AC
  Administered 2017-01-14: 4 mg via ORAL
  Filled 2017-01-14: qty 1

## 2017-01-14 MED ORDER — KETOROLAC TROMETHAMINE 30 MG/ML IJ SOLN
60.0000 mg | Freq: Once | INTRAMUSCULAR | Status: AC
  Administered 2017-01-14: 60 mg via INTRAMUSCULAR
  Filled 2017-01-14: qty 2

## 2017-01-14 MED ORDER — IBUPROFEN 800 MG PO TABS: 800.0000 mg | ORAL_TABLET | Freq: Three times a day (TID) | ORAL | 0 refills | Status: DC | PRN

## 2017-01-14 MED ORDER — HYDROMORPHONE HCL 1 MG/ML IJ SOLN
1.0000 mg | Freq: Once | INTRAMUSCULAR | Status: AC
  Administered 2017-01-14: 1 mg via INTRAMUSCULAR
  Filled 2017-01-14: qty 1

## 2017-01-14 MED ORDER — METHOCARBAMOL 500 MG PO TABS: 500.0000 mg | ORAL_TABLET | Freq: Three times a day (TID) | ORAL | 0 refills | Status: DC | PRN
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
## 2017-01-14 NOTE — Discharge Instructions (Signed)
You are being provided a prescription for opiates (also known as narcotics) for pain control.  Opiates can be addictive and should only be used when absolutely necessary for pain control when other alternatives do not work.  We recommend you only use them for the recommended amount of time and only as prescribed.  Please do not take with other sedative medications or alcohol.  Please do not drive, operate machinery, make important decisions while taking opiates.  Please note that these medications can be addictive and have high abuse potential.  Please keep these medications locked away from children, teenagers or any family members with history of substance abuse.   ° ° ° °

## 2017-02-04 ENCOUNTER — Emergency Department (HOSPITAL_BASED_OUTPATIENT_CLINIC_OR_DEPARTMENT_OTHER): Payer: Self-pay

## 2017-02-04 ENCOUNTER — Emergency Department (HOSPITAL_BASED_OUTPATIENT_CLINIC_OR_DEPARTMENT_OTHER)
Admission: EM | Admit: 2017-02-04 | Discharge: 2017-02-04 | Disposition: A | Discharge status: Home / Self Care | Payer: Self-pay | Attending: Emergency Medicine | Admitting: Emergency Medicine

## 2017-02-04 ENCOUNTER — Encounter (HOSPITAL_BASED_OUTPATIENT_CLINIC_OR_DEPARTMENT_OTHER): Payer: Self-pay | Admitting: *Deleted

## 2017-02-04 DIAGNOSIS — K5900 Constipation, unspecified: Secondary | ICD-10-CM | POA: Insufficient documentation

## 2017-02-04 DIAGNOSIS — E119 Type 2 diabetes mellitus without complications: Secondary | ICD-10-CM | POA: Insufficient documentation

## 2017-02-04 DIAGNOSIS — I10 Essential (primary) hypertension: Secondary | ICD-10-CM | POA: Insufficient documentation

## 2017-02-04 DIAGNOSIS — F17210 Nicotine dependence, cigarettes, uncomplicated: Secondary | ICD-10-CM | POA: Insufficient documentation

## 2017-02-04 DIAGNOSIS — F17290 Nicotine dependence, other tobacco product, uncomplicated: Secondary | ICD-10-CM | POA: Insufficient documentation

## 2017-02-04 DIAGNOSIS — Z79899 Other long term (current) drug therapy: Secondary | ICD-10-CM | POA: Insufficient documentation

## 2017-02-04 LAB — URINALYSIS, ROUTINE W REFLEX MICROSCOPIC
Bilirubin Urine: NEGATIVE
Glucose, UA: >=500 mg/dL — AB
HGB URINE DIPSTICK: NEGATIVE
Ketones, ur: 15 mg/dL — AB
Leukocytes, UA: NEGATIVE
Nitrite: NEGATIVE
Protein, ur: NEGATIVE mg/dL
Specific Gravity, Urine: >1.046 — ABNORMAL HIGH (ref 1.005–1.030)
pH: 6 (ref 5.0–8.0)

## 2017-02-04 LAB — BASIC METABOLIC PANEL
Anion gap: 10 (ref 5–15)
BUN: 14 mg/dL (ref 6–20)
CHLORIDE: 101 mmol/L (ref 101–111)
CO2: 25 mmol/L (ref 22–32)
CREATININE: 0.95 mg/dL (ref 0.61–1.24)
Calcium: 9.4 mg/dL (ref 8.9–10.3)
GFR calc non Af Amer: >60 mL/min (ref >60)
Glucose, Bld: 259 mg/dL — ABNORMAL HIGH (ref 65–99)
POTASSIUM: 3.6 mmol/L (ref 3.5–5.1)
Sodium: 136 mmol/L (ref 135–145)

## 2017-02-04 LAB — URINALYSIS, MICROSCOPIC (REFLEX)
Bacteria, UA: NONE SEEN
SQUAMOUS EPITHELIAL / LPF: NONE SEEN

## 2017-02-04 LAB — RAPID URINE DRUG SCREEN, HOSP PERFORMED
Amphetamines: NOT DETECTED
Barbiturates: NOT DETECTED
Benzodiazepines: NOT DETECTED
Cocaine: NOT DETECTED
OPIATES: NOT DETECTED
Tetrahydrocannabinol: NOT DETECTED

## 2017-02-04 LAB — CBG MONITORING, ED: Glucose-Capillary: 291 mg/dL — ABNORMAL HIGH (ref 65–99)

## 2017-02-04 MED ORDER — METFORMIN HCL 500 MG PO TABS: 500.0000 mg | ORAL_TABLET | Freq: Two times a day (BID) | ORAL | 0 refills | Status: AC

## 2017-02-04 MED ORDER — DICYCLOMINE HCL 10 MG PO CAPS
10.0000 mg | ORAL_CAPSULE | Freq: Once | ORAL | Status: AC
  Administered 2017-02-04: 10 mg via ORAL
  Filled 2017-02-04: qty 1

## 2017-02-04 MED ORDER — METFORMIN HCL 500 MG PO TABS
500.0000 mg | ORAL_TABLET | Freq: Once | ORAL | Status: AC
  Administered 2017-02-04: 500 mg via ORAL
  Filled 2017-02-04: qty 1

## 2017-02-04 MED ORDER — SODIUM CHLORIDE 0.9 % IV BOLUS (SEPSIS)
500.0000 mL | Freq: Once | INTRAVENOUS | Status: AC
  Administered 2017-02-04: 500 mL via INTRAVENOUS

## 2017-02-04 MED ORDER — GI COCKTAIL ~~LOC~~
30.0000 mL | Freq: Once | ORAL | Status: AC
  Administered 2017-02-04: 30 mL via ORAL
  Filled 2017-02-04: qty 30

## 2017-02-04 NOTE — ED Triage Notes (Signed)
Generalized abd pressure and bloating x 2 weeks denies N/V/D constipation or fevers

## 2017-02-04 NOTE — ED Provider Notes (Addendum)
[facility] Provider Note   CSN: [id number] Arrival date & time: 02/04/17  0135     History   Chief Complaint Chief Complaint  Patient presents with  . Abdominal Pain    HPI Aaron Weber is a 46 y.o. male.  The history is provided by the patient. No language interpreter was used.  Abdominal Pain   This is a chronic problem. The current episode started more than 1 week ago. The problem occurs constantly. The problem has not changed since onset.The pain is associated with eating. The pain is located in the generalized abdominal region. The quality of the pain is dull. The pain is moderate. Pertinent negatives include anorexia, fever, belching, diarrhea, flatus, hematochezia, melena, nausea, vomiting, constipation, dysuria, frequency, hematuria, headaches, arthralgias and myalgias. Nothing aggravates the symptoms. Nothing relieves the symptoms. Past workup does not include GI consult. His past medical history is significant for GERD. His past medical history does not include PUD.    Past Medical History:  Diagnosis Date  . GERD (gastroesophageal reflux disease)   . Hypertension   . Pleurisy     There are no active problems to display for this patient.   Past Surgical History:  Procedure Laterality Date  . TONSILLECTOMY         Home Medications    Prior to Admission medications   Medication Sig Start Date End Date Taking? Authorizing Provider  amLODipine (NORVASC) 5 MG tablet Take 5 mg by mouth daily.    Historical Provider, MD  bimatoprost (LUMIGAN) 0.03 % ophthalmic solution 1 drop at bedtime.    Historical Provider, MD  fluticasone (FLONASE) 50 MCG/ACT nasal spray Place 2 sprays into both nostrils daily. 03/17/15   Alyxis Grippi, MD  ibuprofen (ADVIL,MOTRIN) 800 MG tablet Take 1 tablet (800 mg total) by mouth every 8 (eight) hours as needed for mild pain. 01/14/17   Kristen N Ward, DO  loratadine (CLARITIN) 10 MG tablet Take 1 tablet (10 mg total) by mouth  daily. 03/05/12 10/26/16  Jayli Fogleman, MD  OMEPRAZOLE PO Take by mouth.    Historical Provider, MD    Family History History reviewed. No pertinent family history.  Social History Social History  Substance Use Topics  . Smoking status: Current Some Day Smoker    Packs/day: 0.50    Types: Cigarettes, Cigars  . Smokeless tobacco: Former Neurosurgeon    Quit date: 07/15/2012  . Alcohol use Yes     Comment: rare     Allergies   Patient has no known allergies.   Review of Systems Review of Systems  Constitutional: Negative for fever.  Respiratory: Negative for wheezing.   Cardiovascular: Negative for chest pain, palpitations and leg swelling.  Gastrointestinal: Positive for abdominal pain. Negative for anorexia, constipation, diarrhea, flatus, hematochezia, melena, nausea and vomiting.  Endocrine: Negative for polydipsia, polyphagia and polyuria.  Genitourinary: Negative for dysuria, frequency and hematuria.  Musculoskeletal: Negative for arthralgias and myalgias.  Neurological: Negative for headaches.  All other systems reviewed and are negative.    Physical Exam Updated Vital Signs BP 134/89 (BP Location: Right Arm)   Pulse 85   Temp 98.6 F (37 C) (Oral)   Resp 16   Ht 6' (1.829 m)   Wt 224 lb (101.6 kg)   SpO2 95%   BMI 30.38 kg/m   Physical Exam  Constitutional: He is oriented to person, place, and time. He appears well-developed and well-nourished. No distress.  HENT:  Head: Normocephalic and atraumatic.  Nose:  Nose normal.  Mouth/Throat: No oropharyngeal exudate.  Eyes: EOM are normal. Pupils are equal, round, and reactive to light.  Neck: Normal range of motion. Neck supple.  Cardiovascular: Normal rate, regular rhythm, normal heart sounds and intact distal pulses.   Pulmonary/Chest: Effort normal and breath sounds normal. He has no wheezes. He has no rales.  Abdominal: Soft. Bowel sounds are normal. He exhibits no mass. There is no tenderness. There is no  rebound and no guarding.  Musculoskeletal: Normal range of motion.  Neurological: He is alert and oriented to person, place, and time. He displays normal reflexes.  Skin: Skin is warm and dry. Capillary refill takes less than 2 seconds.  Psychiatric: He has a normal mood and affect.     ED Treatments / Results  . Vitals:   02/04/17 0150  BP: 134/89  Pulse: 85  Resp: 16  Temp: 98.6 F (37 C)    Labs (all labs ordered are listed, but only abnormal results are displayed) Results for orders placed or performed during the hospital encounter of 02/04/17  Urinalysis, Routine w reflex microscopic  Result Value Ref Range   Color, Urine YELLOW YELLOW   APPearance CLEAR CLEAR   Specific Gravity, Urine >1.046 (H) 1.005 - 1.030   pH 6.0 5.0 - 8.0   Glucose, UA >=500 (A) NEGATIVE mg/dL   Hgb urine dipstick NEGATIVE NEGATIVE   Bilirubin Urine NEGATIVE NEGATIVE   Ketones, ur 15 (A) NEGATIVE mg/dL   Protein, ur NEGATIVE NEGATIVE mg/dL   Nitrite NEGATIVE NEGATIVE   Leukocytes, UA NEGATIVE NEGATIVE  Urinalysis, Microscopic (reflex)  Result Value Ref Range   RBC / HPF 0-5 0 - 5 RBC/hpf   WBC, UA 0-5 0 - 5 WBC/hpf   Bacteria, UA NONE SEEN NONE SEEN   Squamous Epithelial / LPF NONE SEEN NONE SEEN  Basic metabolic panel  Result Value Ref Range   Sodium 136 135 - 145 mmol/L   Potassium 3.6 3.5 - 5.1 mmol/L   Chloride 101 101 - 111 mmol/L   CO2 25 22 - 32 mmol/L   Glucose, Bld 259 (H) 65 - 99 mg/dL   BUN 14 6 - 20 mg/dL   Creatinine, Ser 1.61 0.61 - 1.24 mg/dL   Calcium 9.4 8.9 - 09.6 mg/dL   GFR calc non Af Amer >60 >60 mL/min   GFR calc Af Amer >60 >60 mL/min   Anion gap 10 5 - 15  Rapid urine drug screen (hospital performed)  Result Value Ref Range   Opiates NONE DETECTED NONE DETECTED   Cocaine NONE DETECTED NONE DETECTED   Benzodiazepines NONE DETECTED NONE DETECTED   Amphetamines NONE DETECTED NONE DETECTED   Tetrahydrocannabinol NONE DETECTED NONE DETECTED   Barbiturates  NONE DETECTED NONE DETECTED  POC CBG, ED  Result Value Ref Range   Glucose-Capillary 291 (H) 65 - 99 mg/dL   Comment 1 Document in Chart    Dg Abdomen Acute W/chest  Result Date: 02/04/2017 CLINICAL DATA:  Periumbilical and flank pain EXAM: DG ABDOMEN ACUTE W/ 1V CHEST COMPARISON:  Chest radiograph 10/26/2016 FINDINGS: There is shallow lung inflation without focal consolidation or pulmonary edema. No pneumothorax or pleural effusion. There is no free intraperitoneal air. No dilated small bowel or air-fluid levels. Visualized bones are normal. No radiopaque calculi or mass. IMPRESSION: Negative abdominal radiographs.  No acute cardiopulmonary disease. Electronically Signed   By: Deatra Robinson M.D.   On: 02/04/2017 03:04    Radiology Dg Abdomen Acute W/chest  Result  Date: 02/04/2017 CLINICAL DATA:  Periumbilical and flank pain EXAM: DG ABDOMEN ACUTE W/ 1V CHEST COMPARISON:  Chest radiograph 10/26/2016 FINDINGS: There is shallow lung inflation without focal consolidation or pulmonary edema. No pneumothorax or pleural effusion. There is no free intraperitoneal air. No dilated small bowel or air-fluid levels. Visualized bones are normal. No radiopaque calculi or mass. IMPRESSION: Negative abdominal radiographs.  No acute cardiopulmonary disease. Electronically Signed   By: Deatra Robinson M.D.   On: 02/04/2017 03:04    Procedures Procedures (including critical care time)  Medications Ordered in ED Medications  sodium chloride 0.9 % bolus 500 mL (not administered)  metFORMIN (GLUCOPHAGE) tablet 500 mg (not administered)  gi cocktail (Maalox,Lidocaine,Donnatal) (30 mLs Oral Given 02/04/17 0315)  dicyclomine (BENTYL) capsule 10 mg (10 mg Oral Given 02/04/17 0315)       Final Clinical Impressions(s) / ED Diagnoses  Constipation with new onset diabetes:  Start metformin and follow up with your PMD.  No sugar adhere to a low carb diet.  Lots of water and no alcohol take all your medications as  directed.   Follow up to get your hemoglobin A1c checked and diabetic exam performed.  Well appearing. Exam and vitals are benign and reassuring. Return for weakness, fever, chest pain, numbness, seizures, vomitingor pain or any concerns. Patient is extremely well appearing and exam and imaging are negative for acute finding.   After history, exam, and medical workup I feel the patient has been appropriately medically screened and is safe for discharge home. Pertinent diagnoses were discussed with the patient. Patient was given return precautions.  New Prescriptions New Prescriptions   No medications on file     Adriell Polansky, MD 02/04/17 0452    Destenee Guerry, MD 02/04/17 1610

## 2017-03-20 ENCOUNTER — Emergency Department (HOSPITAL_BASED_OUTPATIENT_CLINIC_OR_DEPARTMENT_OTHER)
Admission: EM | Admit: 2017-03-20 | Discharge: 2017-03-21 | Disposition: A | Discharge status: Home / Self Care | Payer: Self-pay | Attending: Emergency Medicine | Admitting: Emergency Medicine

## 2017-03-20 ENCOUNTER — Encounter (HOSPITAL_BASED_OUTPATIENT_CLINIC_OR_DEPARTMENT_OTHER): Payer: Self-pay

## 2017-03-20 DIAGNOSIS — Z79899 Other long term (current) drug therapy: Secondary | ICD-10-CM | POA: Insufficient documentation

## 2017-03-20 DIAGNOSIS — Z7984 Long term (current) use of oral hypoglycemic drugs: Secondary | ICD-10-CM | POA: Insufficient documentation

## 2017-03-20 DIAGNOSIS — E119 Type 2 diabetes mellitus without complications: Secondary | ICD-10-CM | POA: Insufficient documentation

## 2017-03-20 DIAGNOSIS — R1013 Epigastric pain: Secondary | ICD-10-CM | POA: Insufficient documentation

## 2017-03-20 DIAGNOSIS — F17290 Nicotine dependence, other tobacco product, uncomplicated: Secondary | ICD-10-CM | POA: Insufficient documentation

## 2017-03-20 DIAGNOSIS — I10 Essential (primary) hypertension: Secondary | ICD-10-CM | POA: Insufficient documentation

## 2017-03-20 HISTORY — DX: Type 2 diabetes mellitus without complications: E11.9

## 2017-03-20 LAB — URINALYSIS, ROUTINE W REFLEX MICROSCOPIC
Bilirubin Urine: NEGATIVE
Glucose, UA: NEGATIVE mg/dL
Hgb urine dipstick: NEGATIVE
Ketones, ur: 15 mg/dL — AB
LEUKOCYTES UA: NEGATIVE
NITRITE: NEGATIVE
PROTEIN: NEGATIVE mg/dL
SPECIFIC GRAVITY, URINE: 1.024 (ref 1.005–1.030)
pH: 5.5 (ref 5.0–8.0)

## 2017-03-20 NOTE — ED Triage Notes (Signed)
C/o abd x 1 week-denies n/v/d-NAD-steady gait

## 2017-03-20 NOTE — ED Provider Notes (Signed)
[facility]   CSN: [id number] Arrival date & time: 03/20/17  2056  By signing my name below, I, Cynda Acres, attest that this documentation has been prepared under the direction and in the presence of Geoffery Lyons, MD. Electronically Signed: Cynda Acres, Scribe. 03/20/17. 11:34 PM.  History   Chief Complaint Chief Complaint  Patient presents with  . Abdominal Pain    HPI Comments: Aaron Weber is a 46 y.o. male with a history of GERD, DM, and HTN, who presents to the Emergency Department complaining of sudden-onset, constant abdominal pain that began one week ago. Patient states he was placed on metformin once month ago, which he believes is causing his abdominal pain. Patient reports gradually worsening abdominal pain. Patient reports associated "bloating". Patient reports raking laxatives with no relief. Patient denies any alcohol use or prior abdominal surgeries. Patient denies any fever, chills, nausea, vomiting, diarrhea, constipation, or any additional symptoms.    The history is provided by the patient. No language interpreter was used.    Past Medical History:  Diagnosis Date  . Diabetes mellitus without complication (HCC)   . GERD (gastroesophageal reflux disease)   . Hypertension   . Pleurisy     There are no active problems to display for this patient.   Past Surgical History:  Procedure Laterality Date  . TONSILLECTOMY         Home Medications    Prior to Admission medications   Medication Sig Start Date End Date Taking? Authorizing Provider  amLODipine (NORVASC) 5 MG tablet Take 5 mg by mouth daily.    [provider]  bimatoprost (LUMIGAN) 0.03 % ophthalmic solution 1 drop at bedtime.    [provider]  fluticasone (FLONASE) 50 MCG/ACT nasal spray Place 2 sprays into both nostrils daily. 03/17/15   Palumbo, April, MD  ibuprofen (ADVIL,MOTRIN) 800 MG tablet Take 1 tablet (800 mg total) by mouth every 8  (eight) hours as needed for mild pain. 01/14/17   Ward, Layla Maw, DO  loratadine (CLARITIN) 10 MG tablet Take 1 tablet (10 mg total) by mouth daily. 03/05/12 10/26/16  Palumbo, April, MD  metFORMIN (GLUCOPHAGE) 500 MG tablet Take 1 tablet (500 mg total) by mouth 2 (two) times daily with a meal. 02/04/17   Palumbo, April, MD  OMEPRAZOLE PO Take by mouth.    [provider]    Family History No family history on file.  Social History Social History  Substance Use Topics  . Smoking status: Current Some Day Smoker    Packs/day: 0.50    Types: Cigars  . Smokeless tobacco: Former Neurosurgeon  . Alcohol use No     Allergies   Patient has no known allergies.   Review of Systems Review of Systems  Constitutional: Negative for chills and fever.  Gastrointestinal: Positive for abdominal pain. Negative for constipation, diarrhea, nausea and vomiting.  All other systems reviewed and are negative.    Physical Exam Updated Vital Signs BP 120/85 (BP Location: Left Arm)   Pulse 86   Temp 97.9 F (36.6 C) (Oral)   Resp 18   Ht 5\' 11"  (1.803 m)   Wt 225 lb 1 oz (102.1 kg)   SpO2 95%   BMI 31.39 kg/m   Physical Exam  Constitutional: He is oriented to person, place, and time. He appears well-developed and well-nourished.  HENT:  Head: Normocephalic and atraumatic.  Mouth/Throat: Oropharynx is clear and moist.  Eyes: Pupils are equal, round, and reactive  to light.  Neck: Normal range of motion. Neck supple.  Cardiovascular: Normal rate and regular rhythm.   No murmur heard. Pulmonary/Chest: Effort normal and breath sounds normal. No respiratory distress. He has no wheezes. He has no rales.  Abdominal: Soft. Bowel sounds are normal. He exhibits no distension. There is no tenderness. There is no rebound and no guarding.  TTP to the epigastrium.   Musculoskeletal: Normal range of motion.  Neurological: He is alert and oriented to person, place, and time.  Skin: Skin is warm and dry.    Psychiatric: He has a normal mood and affect.  Nursing note and vitals reviewed.    ED Treatments / Results  DIAGNOSTIC STUDIES: Oxygen Saturation is 95% on RA, adequate by my interpretation.    COORDINATION OF CARE: 11:33 PM Discussed treatment plan with pt at bedside and pt agreed to plan, which includes lab work.  Labs (all labs ordered are listed, but only abnormal results are displayed) Labs Reviewed  URINALYSIS, ROUTINE W REFLEX MICROSCOPIC    EKG  EKG Interpretation None       Radiology No results found.  Procedures Procedures (including critical care time)  Medications Ordered in ED Medications - No data to display   Initial Impression / Assessment and Plan / ED Course  I have reviewed the triage vital signs and the nursing notes.  Pertinent labs & imaging results that were available during my care of the patient were reviewed by me and considered in my medical decision making (see chart for details).  Patient presents with GI upset he believes is related to starting treatment with metformin 1 month ago. When he takes this medication, he develops irritation of his stomach and nausea. His presentation sounded like a side effect of this medication, however additional workup was obtained including laboratory studies and urinalysis. He has no white count, normal LFTs, and lipase of 65, which is just minimally elevated and I doubt clinically significant.  I will change his medication from metformin to Actos and have him follow-up with his primary Dr. His abdomen is benign, and he appears in no acute distress/discomfort.  Final Clinical Impressions(s) / ED Diagnoses   Final diagnoses:  None    New Prescriptions New Prescriptions   No medications on file   I personally performed the services described in this documentation, which was scribed in my presence. The recorded information has been reviewed and is accurate.        Geoffery Lyonselo, Kidus Delman, MD 03/21/17  [phone]

## 2017-03-21 LAB — COMPREHENSIVE METABOLIC PANEL
ALBUMIN: 4.2 g/dL (ref 3.5–5.0)
ALK PHOS: 86 U/L (ref 38–126)
ALT: 16 U/L — ABNORMAL LOW (ref 17–63)
ANION GAP: 7 (ref 5–15)
AST: 15 U/L (ref 15–41)
BUN: 14 mg/dL (ref 6–20)
CALCIUM: 9.4 mg/dL (ref 8.9–10.3)
CO2: 26 mmol/L (ref 22–32)
Chloride: 105 mmol/L (ref 101–111)
Creatinine, Ser: 0.95 mg/dL (ref 0.61–1.24)
GFR calc Af Amer: >60 mL/min (ref >60)
GFR calc non Af Amer: >60 mL/min (ref >60)
GLUCOSE: 90 mg/dL (ref 65–99)
Potassium: 4.3 mmol/L (ref 3.5–5.1)
SODIUM: 138 mmol/L (ref 135–145)
Total Bilirubin: 0.3 mg/dL (ref 0.3–1.2)
Total Protein: 8.1 g/dL (ref 6.5–8.1)

## 2017-03-21 LAB — CBC WITH DIFFERENTIAL/PLATELET
Basophils Absolute: 0.1 10*3/uL (ref 0.0–0.1)
Basophils Relative: 1 %
EOS ABS: 0.2 10*3/uL (ref 0.0–0.7)
Eosinophils Relative: 2 %
HEMATOCRIT: 48.8 % (ref 39.0–52.0)
HEMOGLOBIN: 16.8 g/dL (ref 13.0–17.0)
LYMPHS ABS: 3.9 10*3/uL (ref 0.7–4.0)
Lymphocytes Relative: 45 %
MCH: 30.7 pg (ref 26.0–34.0)
MCHC: 34.4 g/dL (ref 30.0–36.0)
MCV: 89.2 fL (ref 78.0–100.0)
MONOS PCT: 9 %
Monocytes Absolute: 0.8 10*3/uL (ref 0.1–1.0)
NEUTROS ABS: 3.8 10*3/uL (ref 1.7–7.7)
NEUTROS PCT: 43 %
Platelets: 356 10*3/uL (ref 150–400)
RBC: 5.47 MIL/uL (ref 4.22–5.81)
RDW: 14.2 % (ref 11.5–15.5)
WBC: 8.7 10*3/uL (ref 4.0–10.5)

## 2017-03-21 LAB — LIPASE, BLOOD: Lipase: 65 U/L — ABNORMAL HIGH (ref 11–51)

## 2017-03-21 MED ORDER — PIOGLITAZONE HCL 15 MG PO TABS: 15.0000 mg | ORAL_TABLET | Freq: Every day | ORAL | 0 refills | Status: DC

## 2017-03-21 NOTE — ED Notes (Signed)
Pt c/o generalized abdominal pain.  Pt is laughing and joking with staff, no n/v/d, no acute distress.  Pt states that his metformin makes his stomach hurt.

## 2017-03-21 NOTE — ED Notes (Signed)
Discharge instructions explained to patient.  Pt states, "you aren't going to give me some pills to go home with?"  Explained to patient that that is not the practice and informed him of the 24 hour pharmacies.  Pt asked how much the medication was, informed that the nurse does not know.  Pt then stated, "he's not even going to give me any pain medication?"  Pt informed that he can take tylenol or ibuprofen.  Pt states that he is "sick of taking ibuprofen."  Pt informed that he can try some aleve.  He states that he could have stayed home if that was the case.  Pt informed that there are new restrictions on narcotic medications, he assures the nurse that he was not requesting those.  Nurse reiterated that his is welcome to try the OTC meds then, and that switching his diabetes medication could solve the problem.  Pt states again that he could have stayed home if he was not going to get pain medication.  Thanked pt for his time and asked if he had any further questions regarding his dc instructions, pt denies.

## 2017-03-21 NOTE — ED Notes (Signed)
[facility]

## 2017-03-21 NOTE — Discharge Instructions (Signed)
Stop taking your metformin. Begin taking Actos as prescribed today.  Follow-up with your primary Dr. in the next week for a recheck, and return to the ER if you develop worsening pain, high fevers, bloody stools, or other new and concerning symptoms.

## 2017-06-16 ENCOUNTER — Encounter (HOSPITAL_BASED_OUTPATIENT_CLINIC_OR_DEPARTMENT_OTHER): Payer: Self-pay

## 2017-06-16 ENCOUNTER — Emergency Department (HOSPITAL_BASED_OUTPATIENT_CLINIC_OR_DEPARTMENT_OTHER)
Admission: EM | Admit: 2017-06-16 | Discharge: 2017-06-16 | Disposition: A | Discharge status: Home / Self Care | Payer: Self-pay | Attending: Emergency Medicine | Admitting: Emergency Medicine

## 2017-06-16 DIAGNOSIS — F17290 Nicotine dependence, other tobacco product, uncomplicated: Secondary | ICD-10-CM | POA: Insufficient documentation

## 2017-06-16 DIAGNOSIS — I10 Essential (primary) hypertension: Secondary | ICD-10-CM | POA: Insufficient documentation

## 2017-06-16 DIAGNOSIS — K0889 Other specified disorders of teeth and supporting structures: Secondary | ICD-10-CM | POA: Insufficient documentation

## 2017-06-16 DIAGNOSIS — E119 Type 2 diabetes mellitus without complications: Secondary | ICD-10-CM | POA: Insufficient documentation

## 2017-06-16 DIAGNOSIS — Z7984 Long term (current) use of oral hypoglycemic drugs: Secondary | ICD-10-CM | POA: Insufficient documentation

## 2017-06-16 DIAGNOSIS — Z79899 Other long term (current) drug therapy: Secondary | ICD-10-CM | POA: Insufficient documentation

## 2017-06-16 MED ORDER — MAGIC MOUTHWASH: 5.0000 mL | Freq: Three times a day (TID) | ORAL | 0 refills | Status: AC

## 2017-06-16 MED ORDER — MAGIC MOUTHWASH
10.0000 mL | Freq: Once | ORAL | Status: AC
  Administered 2017-06-16: 10 mL via ORAL
  Filled 2017-06-16: qty 10

## 2017-06-16 MED ORDER — PENICILLIN V POTASSIUM 500 MG PO TABS
500.0000 mg | ORAL_TABLET | Freq: Four times a day (QID) | ORAL | 0 refills | Status: AC
Start: 2017-06-16 — End: 2017-06-23

## 2017-06-16 NOTE — ED Provider Notes (Signed)
[facility]   CSN: [id number] Arrival date & time: 06/16/17  1812     History   Chief Complaint Chief Complaint  Patient presents with  . Dental Pain    HPI Aaron Weber is a 46 y.o. male.    The history is provided by the patient and a significant other.  Dental Pain   The current episode started more than 2 days ago. The problem occurs constantly. The problem has been gradually worsening. The pain is at a severity of 7/10. The pain is moderate. He has tried nothing for the symptoms. The treatment provided no relief.    Past Medical History:  Diagnosis Date  . Diabetes mellitus without complication (HCC)   . GERD (gastroesophageal reflux disease)   . Hypertension   . Pleurisy     There are no active problems to display for this patient.   Past Surgical History:  Procedure Laterality Date  . TONSILLECTOMY         Home Medications    Prior to Admission medications   Medication Sig Start Date End Date Taking? Authorizing Provider  amLODipine (NORVASC) 5 MG tablet Take 5 mg by mouth daily.    [provider]  bimatoprost (LUMIGAN) 0.03 % ophthalmic solution 1 drop at bedtime.    [provider]  fluticasone (FLONASE) 50 MCG/ACT nasal spray Place 2 sprays into both nostrils daily. 03/17/15   Palumbo, April, MD  ibuprofen (ADVIL,MOTRIN) 800 MG tablet Take 1 tablet (800 mg total) by mouth every 8 (eight) hours as needed for mild pain. 01/14/17   Ward, Layla Maw, DO  loratadine (CLARITIN) 10 MG tablet Take 1 tablet (10 mg total) by mouth daily. 03/05/12 10/26/16  Palumbo, April, MD  metFORMIN (GLUCOPHAGE) 500 MG tablet Take 1 tablet (500 mg total) by mouth 2 (two) times daily with a meal. 02/04/17   Palumbo, April, MD  OMEPRAZOLE PO Take by mouth.    [provider]    Family History No family history on file.  Social History Social History  Substance Use Topics  . Smoking status: Current Some Day Smoker   Packs/day: 0.50    Types: Cigars  . Smokeless tobacco: Former Neurosurgeon  . Alcohol use No     Allergies   Patient has no known allergies.   Review of Systems Review of Systems  Constitutional: Negative for chills, diaphoresis, fatigue and fever.  HENT: Positive for dental problem. Negative for congestion, drooling, ear pain, facial swelling, nosebleeds, postnasal drip, rhinorrhea, sore throat, trouble swallowing and voice change.   Respiratory: Negative for chest tightness and shortness of breath.   Cardiovascular: Negative for chest pain.  Gastrointestinal: Negative for diarrhea, nausea and vomiting.  Musculoskeletal: Negative for back pain and neck stiffness.  Skin: Negative for wound.  Neurological: Negative for facial asymmetry and headaches.  Psychiatric/Behavioral: Negative for agitation.  All other systems reviewed and are negative.    Physical Exam Updated Vital Signs BP (!) 155/84 (BP Location: Left Arm)   Pulse 89   Temp 98.5 F (36.9 C) (Oral)   Resp 18   Ht 5\' 11"  (1.803 m)   Wt 103 kg (227 lb)   SpO2 98%   BMI 31.66 kg/m   Physical Exam  Constitutional: He is oriented to person, place, and time. He appears well-developed and well-nourished. No distress.  HENT:  Right Ear: External ear normal.  Left Ear: External ear normal.  Nose: Nose normal.  Mouth/Throat: Uvula is midline and  oropharynx is clear and moist. He does not have dentures. Normal dentition. No dental abscesses, lacerations or dental caries. No oropharyngeal exudate.    Eyes: Pupils are equal, round, and reactive to light. Conjunctivae and EOM are normal.  Neck: Normal range of motion.  Cardiovascular: Normal rate.   No murmur heard. Pulmonary/Chest: Effort normal. No stridor. No respiratory distress. He exhibits no tenderness.  Abdominal: He exhibits no distension.  Neurological: He is alert and oriented to person, place, and time. No sensory deficit. He exhibits normal muscle tone.  Skin:  Skin is warm. He is not diaphoretic. No erythema. No pallor.  Nursing note and vitals reviewed.    ED Treatments / Results  Labs (all labs ordered are listed, but only abnormal results are displayed) Labs Reviewed - No data to display  EKG  EKG Interpretation None       Radiology No results found.  Procedures Procedures (including critical care time)  Medications Ordered in ED Medications  magic mouthwash (10 mLs Oral Given 06/16/17 2219)     Initial Impression / Assessment and Plan / ED Course  I have reviewed the triage vital signs and the nursing notes.  Pertinent labs & imaging results that were available during my care of the patient were reviewed by me and considered in my medical decision making (see chart for details).     Aaron Weber is a 46 y.o. male with a past medical history significant for diabetes, hypertension, and GERD who presents with right upper dental pain. Patient says that he was eating a [employer] taco the several days ago when he felt something get stuck in his teeth. He says that he was unable to remove the food from his teeth and has had worsening pain. He says that he has not taken any medicine for symptoms. He describes his pain as moderate to severe. No reported trismus, difficulty swallowing, or voice change. No facial swelling, vision changes, or neck pain. Stiffness. No other abnormalities on exam.  On exam, patient has tenderness in the right gumline. No loose teeth. No evidence of large abscess seen. No maxillary tenderness. No abnormality on nasal exam or posterior oropharyngeal exam. No PTA evidence. Normal neck range of motion and lungs clear with no stridor.  Suspect dental pain is secondary to a developing dental infection. There is some erythema seen but no drainable abscess present.  Pt was offered a dental block but he did not want this today.  Patient requested mouthwash and was given adjuvant mouthwash. Patient will be given  prescription for antibiotics and dental follow-up instructions.  Patient will follow up with dentistry and understood return precautions. Patient had no other questions or concerns and was discharged in good condition.    Final Clinical Impressions(s) / ED Diagnoses   Final diagnoses:  Pain, dental  Tooth pain    New Prescriptions Discharge Medication List as of 06/16/2017 10:58 PM    START taking these medications   Details  magic mouthwash SOLN Take 5 mLs by mouth 3 (three) times daily., Starting Tue 06/16/2017, Until Sat 06/20/2017, Print    penicillin v potassium (VEETID) 500 MG tablet Take 1 tablet (500 mg total) by mouth 4 (four) times daily., Starting Tue 06/16/2017, Until Tue 06/23/2017, Print       Clinical Impression: 1. Pain, dental   2. Tooth pain     Disposition: Discharge  Condition: Good  I have discussed the results, Dx and Tx plan with the pt(& family if  present). He/she/they expressed understanding and agree(s) with the plan. Discharge instructions discussed at great length. Strict return precautions discussed and pt &/or family have verbalized understanding of the instructions. No further questions at time of discharge.    Discharge Medication List as of 06/16/2017 10:58 PM    START taking these medications   Details  magic mouthwash SOLN Take 5 mLs by mouth 3 (three) times daily., Starting Tue 06/16/2017, Until Sat 06/20/2017, Print    penicillin v potassium (VEETID) 500 MG tablet Take 1 tablet (500 mg total) by mouth 4 (four) times daily., Starting Tue 06/16/2017, Until Tue 06/23/2017, Print        Follow Up: No follow-up provider specified.    Tegeler, Canary Brimhristopher J, MD 06/17/17 [phone]

## 2017-06-16 NOTE — Discharge Instructions (Signed)
Please take your antibiotics to help treat sure dental pain. Please use the Magic mouthwash to help with her symptoms. Please follow-up with a dentist. If any symptoms change or worsen, please return to the nearest emergency department.

## 2017-06-16 NOTE — ED Triage Notes (Signed)
C/o right upper toothache x 3 days-NAD-steady gait

## 2017-06-16 NOTE — ED Notes (Signed)
ED Provider at bedside. 

## 2017-07-05 ENCOUNTER — Emergency Department (HOSPITAL_BASED_OUTPATIENT_CLINIC_OR_DEPARTMENT_OTHER): Payer: Self-pay

## 2017-07-05 ENCOUNTER — Encounter (HOSPITAL_BASED_OUTPATIENT_CLINIC_OR_DEPARTMENT_OTHER): Payer: Self-pay | Admitting: *Deleted

## 2017-07-05 ENCOUNTER — Emergency Department (HOSPITAL_BASED_OUTPATIENT_CLINIC_OR_DEPARTMENT_OTHER)
Admission: EM | Admit: 2017-07-05 | Discharge: 2017-07-05 | Disposition: A | Discharge status: Home / Self Care | Payer: Self-pay | Attending: Emergency Medicine | Admitting: Emergency Medicine

## 2017-07-05 DIAGNOSIS — I10 Essential (primary) hypertension: Secondary | ICD-10-CM | POA: Insufficient documentation

## 2017-07-05 DIAGNOSIS — R1013 Epigastric pain: Secondary | ICD-10-CM | POA: Insufficient documentation

## 2017-07-05 DIAGNOSIS — E119 Type 2 diabetes mellitus without complications: Secondary | ICD-10-CM | POA: Insufficient documentation

## 2017-07-05 DIAGNOSIS — Z79899 Other long term (current) drug therapy: Secondary | ICD-10-CM | POA: Insufficient documentation

## 2017-07-05 DIAGNOSIS — F17200 Nicotine dependence, unspecified, uncomplicated: Secondary | ICD-10-CM | POA: Insufficient documentation

## 2017-07-05 DIAGNOSIS — Z7984 Long term (current) use of oral hypoglycemic drugs: Secondary | ICD-10-CM | POA: Insufficient documentation

## 2017-07-05 LAB — CBC WITH DIFFERENTIAL/PLATELET
Basophils Absolute: 0 10*3/uL (ref 0.0–0.1)
Basophils Relative: 0 %
Eosinophils Absolute: 0.3 10*3/uL (ref 0.0–0.7)
Eosinophils Relative: 3 %
HEMATOCRIT: 49.7 % (ref 39.0–52.0)
Hemoglobin: 17.2 g/dL — ABNORMAL HIGH (ref 13.0–17.0)
LYMPHS PCT: 42 %
Lymphs Abs: 4.3 10*3/uL — ABNORMAL HIGH (ref 0.7–4.0)
MCH: 30.3 pg (ref 26.0–34.0)
MCHC: 34.6 g/dL (ref 30.0–36.0)
MCV: 87.5 fL (ref 78.0–100.0)
MONO ABS: 0.7 10*3/uL (ref 0.1–1.0)
Monocytes Relative: 7 %
NEUTROS ABS: 4.8 10*3/uL (ref 1.7–7.7)
Neutrophils Relative %: 48 %
PLATELETS: 316 10*3/uL (ref 150–400)
RBC: 5.68 MIL/uL (ref 4.22–5.81)
RDW: 15.3 % (ref 11.5–15.5)
WBC: 10.1 10*3/uL (ref 4.0–10.5)

## 2017-07-05 LAB — COMPREHENSIVE METABOLIC PANEL
ALK PHOS: 84 U/L (ref 38–126)
ALT: 13 U/L — ABNORMAL LOW (ref 17–63)
ANION GAP: 7 (ref 5–15)
AST: 17 U/L (ref 15–41)
Albumin: 4.4 g/dL (ref 3.5–5.0)
BUN: 11 mg/dL (ref 6–20)
CALCIUM: 9.3 mg/dL (ref 8.9–10.3)
CO2: 24 mmol/L (ref 22–32)
Chloride: 106 mmol/L (ref 101–111)
Creatinine, Ser: 0.86 mg/dL (ref 0.61–1.24)
GFR calc Af Amer: >60 mL/min (ref >60)
GFR calc non Af Amer: >60 mL/min (ref >60)
Glucose, Bld: 95 mg/dL (ref 65–99)
POTASSIUM: 3.7 mmol/L (ref 3.5–5.1)
Sodium: 137 mmol/L (ref 135–145)
Total Bilirubin: 0.3 mg/dL (ref 0.3–1.2)
Total Protein: 8.3 g/dL — ABNORMAL HIGH (ref 6.5–8.1)

## 2017-07-05 LAB — URINALYSIS, ROUTINE W REFLEX MICROSCOPIC
Bilirubin Urine: NEGATIVE
Glucose, UA: NEGATIVE mg/dL
Hgb urine dipstick: NEGATIVE
Ketones, ur: NEGATIVE mg/dL
Leukocytes, UA: NEGATIVE
NITRITE: NEGATIVE
Protein, ur: NEGATIVE mg/dL
pH: 6 (ref 5.0–8.0)

## 2017-07-05 LAB — LIPASE, BLOOD: LIPASE: 64 U/L — AB (ref 11–51)

## 2017-07-05 MED ORDER — GI COCKTAIL ~~LOC~~
30.0000 mL | Freq: Once | ORAL | Status: AC
  Administered 2017-07-05: 30 mL via ORAL
  Filled 2017-07-05: qty 30

## 2017-07-05 MED ORDER — TRAMADOL HCL 50 MG PO TABS: 50.0000 mg | ORAL_TABLET | Freq: Four times a day (QID) | ORAL | 0 refills | Status: DC | PRN

## 2017-07-05 MED ORDER — TRAMADOL HCL 50 MG PO TABS
50.0000 mg | ORAL_TABLET | Freq: Once | ORAL | Status: AC
  Administered 2017-07-05: 50 mg via ORAL
  Filled 2017-07-05: qty 1

## 2017-07-05 NOTE — Discharge Instructions (Signed)
Tramadol as prescribed as needed for pain.  Magnesium citrate: Drink entire 10 ounce bottle mixed with equal parts Sprite or Gatorade for relief of constipation.  Follow-up with your primary Dr. if symptoms are not improving in the next week, and return to the ER if you develop worsening pain, high fever, bloody stool, or other new and concerning symptoms.

## 2017-07-05 NOTE — ED Notes (Signed)
Alert, NAD, calm, interactive, resps e/u, speaking in clear complete sentences, no dyspnea noted, skin W&D, VSS, reports abd pain, some back pain, (denies: urinary sx, sob, NVD, constipation, dizziness or visual changes), no relief with famotidine, advil, or miralax at home. Family at [facility]. H/o DM.

## 2017-07-05 NOTE — ED Triage Notes (Signed)
Pt reports midline upper abdominal pain, "feels gassy and bloated." Pt says that he has been taking mirilax at home, although has not been constipated.

## 2017-07-05 NOTE — ED Provider Notes (Signed)
[facility]   CSN: [id number] Arrival date & time: 07/05/17  0009     History   Chief Complaint Chief Complaint  Patient presents with  . Abdominal Pain    HPI Aaron Weber is a 46 y.o. male.  Patient is a 46 year old male with history of recently diagnosed diabetes, GERD, hypertension. He presents today for evaluation of epigastric pain and abdominal bloating. This is been ongoing for the past several days. He reports that when he eats he feels full and bloated. He denies any vomiting but does feel nauseated. He denies any constipation or diarrhea. He denies any bloody stools. He was diagnosed with diabetes and started on metformin several months ago and suspects this medicine may be causing his symptoms.   The history is provided by the patient.  Abdominal Pain   This is a new problem. Episode onset: 3 days ago. The problem occurs constantly. The problem has been gradually worsening. The pain is associated with eating. The pain is located in the epigastric region. The quality of the pain is cramping. The pain is moderate. Associated symptoms include belching and nausea. Pertinent negatives include fever, flatus, vomiting and constipation. Nothing aggravates the symptoms. Nothing relieves the symptoms.    Past Medical History:  Diagnosis Date  . Diabetes mellitus without complication (HCC)   . GERD (gastroesophageal reflux disease)   . Hypertension   . Pleurisy     There are no active problems to display for this patient.   Past Surgical History:  Procedure Laterality Date  . TONSILLECTOMY         Home Medications    Prior to Admission medications   Medication Sig Start Date End Date Taking? Authorizing Provider  amLODipine (NORVASC) 5 MG tablet Take 5 mg by mouth daily.    [provider]  bimatoprost (LUMIGAN) 0.03 % ophthalmic solution 1 drop at bedtime.    [provider]  fluticasone (FLONASE) 50 MCG/ACT nasal  spray Place 2 sprays into both nostrils daily. 03/17/15   Palumbo, April, MD  ibuprofen (ADVIL,MOTRIN) 800 MG tablet Take 1 tablet (800 mg total) by mouth every 8 (eight) hours as needed for mild pain. 01/14/17   Ward, Layla Maw, DO  loratadine (CLARITIN) 10 MG tablet Take 1 tablet (10 mg total) by mouth daily. 03/05/12 10/26/16  Palumbo, April, MD  metFORMIN (GLUCOPHAGE) 500 MG tablet Take 1 tablet (500 mg total) by mouth 2 (two) times daily with a meal. 02/04/17   Palumbo, April, MD  OMEPRAZOLE PO Take by mouth.    [provider]    Family History No family history on file.  Social History Social History  Substance Use Topics  . Smoking status: Current Some Day Smoker    Packs/day: 0.50    Types: Cigars  . Smokeless tobacco: Former Neurosurgeon  . Alcohol use No     Allergies   Patient has no known allergies.   Review of Systems Review of Systems  Constitutional: Negative for fever.  Gastrointestinal: Positive for abdominal pain and nausea. Negative for constipation, flatus and vomiting.  All other systems reviewed and are negative.    Physical Exam Updated Vital Signs BP 125/88 (BP Location: Right Arm)   Pulse 78   Temp 98.7 F (37.1 C) (Oral)   Resp 16   SpO2 98%   Physical Exam  Constitutional: He is oriented to person, place, and time. He appears well-developed and well-nourished. No distress.  HENT:  Head: Normocephalic and  atraumatic.  Mouth/Throat: Oropharynx is clear and moist.  Neck: Normal range of motion. Neck supple.  Cardiovascular: Normal rate and regular rhythm.  Exam reveals no friction rub.   No murmur heard. Pulmonary/Chest: Effort normal and breath sounds normal. No respiratory distress. He has no wheezes. He has no rales.  Abdominal: Soft. Bowel sounds are normal. He exhibits no distension. There is tenderness. There is no rebound and no guarding.  There is tenderness in the epigastrium.  Musculoskeletal: Normal range of motion. He exhibits no  edema.  Neurological: He is alert and oriented to person, place, and time. Coordination normal.  Skin: Skin is warm and dry. He is not diaphoretic.  Nursing note and vitals reviewed.    ED Treatments / Results  Labs (all labs ordered are listed, but only abnormal results are displayed) Labs Reviewed  URINALYSIS, ROUTINE W REFLEX MICROSCOPIC - Abnormal; Notable for the following:       Result Value   Specific Gravity, Urine >1.030 (*)    All other components within normal limits  COMPREHENSIVE METABOLIC PANEL  CBC WITH DIFFERENTIAL/PLATELET  LIPASE, BLOOD  CBC WITH DIFFERENTIAL/PLATELET    EKG  EKG Interpretation None       Radiology No results found.  Procedures Procedures (including critical care time)  Medications Ordered in ED Medications  gi cocktail (Maalox,Lidocaine,Donnatal) (not administered)     Initial Impression / Assessment and Plan / ED Course  I have reviewed the triage vital signs and the nursing notes.  Pertinent labs & imaging results that were available during my care of the patient were reviewed by me and considered in my medical decision making (see chart for details).  Patient presents here with complaints of epigastric discomfort for the past several days. He was recently started on metformin and this may well be a side effect of this medication. There is nothing else in the workup to explain his discomfort with the exception of stool within the colon. He will be advised to take magnesium citrate and follow-up with primary Dr. if this does not resolve his symptoms. He may want to discuss a different diabetic medication which may be better tolerated.  Final Clinical Impressions(s) / ED Diagnoses   Final diagnoses:  None    New Prescriptions New Prescriptions   No medications on file     Geoffery Lyons, MD 07/05/17 [phone]

## 2017-07-05 NOTE — ED Notes (Signed)
Resting sleeping, "pain is easing up, just feel gassy", NAD, calm.

## 2017-07-05 NOTE — ED Notes (Signed)
EDP into room 

## 2017-07-05 NOTE — ED Notes (Signed)
EDP into room, at BS.  ?

## 2017-07-08 ENCOUNTER — Telehealth (HOSPITAL_BASED_OUTPATIENT_CLINIC_OR_DEPARTMENT_OTHER): Payer: Self-pay | Admitting: *Deleted

## 2017-07-08 NOTE — Telephone Encounter (Signed)
Call received from [facility] at the [facility] in [place] [phone]). States pt presented a prescription for Actos to be filled today that was written by Dr. Judd Lien in June. This is a new medication. She questioned if Rx should be filled. Chart reviewed by Dr. Deretha Emory who states pt should f/u with his PCP before starting this medication. Called clinic and left message on VM for [facility] to contact ED charge nurse.

## 2018-01-15 ENCOUNTER — Emergency Department (HOSPITAL_BASED_OUTPATIENT_CLINIC_OR_DEPARTMENT_OTHER)
Admission: EM | Admit: 2018-01-15 | Discharge: 2018-01-15 | Disposition: A | Discharge status: Home / Self Care | Payer: Self-pay | Attending: Emergency Medicine | Admitting: Emergency Medicine

## 2018-01-15 ENCOUNTER — Encounter (HOSPITAL_BASED_OUTPATIENT_CLINIC_OR_DEPARTMENT_OTHER): Payer: Self-pay | Admitting: *Deleted

## 2018-01-15 ENCOUNTER — Other Ambulatory Visit: Payer: Self-pay

## 2018-01-15 DIAGNOSIS — F17290 Nicotine dependence, other tobacco product, uncomplicated: Secondary | ICD-10-CM | POA: Insufficient documentation

## 2018-01-15 DIAGNOSIS — E119 Type 2 diabetes mellitus without complications: Secondary | ICD-10-CM | POA: Insufficient documentation

## 2018-01-15 DIAGNOSIS — Z79899 Other long term (current) drug therapy: Secondary | ICD-10-CM | POA: Insufficient documentation

## 2018-01-15 DIAGNOSIS — I10 Essential (primary) hypertension: Secondary | ICD-10-CM | POA: Insufficient documentation

## 2018-01-15 DIAGNOSIS — Z7984 Long term (current) use of oral hypoglycemic drugs: Secondary | ICD-10-CM | POA: Insufficient documentation

## 2018-01-15 DIAGNOSIS — K0500 Acute gingivitis, plaque induced: Secondary | ICD-10-CM | POA: Insufficient documentation

## 2018-01-15 MED ORDER — CLINDAMYCIN HCL 300 MG PO CAPS: 300.0000 mg | ORAL_CAPSULE | Freq: Four times a day (QID) | ORAL | 0 refills | Status: DC

## 2018-01-15 MED ORDER — IBUPROFEN 800 MG PO TABS: 800.0000 mg | ORAL_TABLET | Freq: Three times a day (TID) | ORAL | 0 refills | Status: DC | PRN

## 2018-01-15 MED ORDER — CHLORHEXIDINE GLUCONATE 0.12 % MT SOLN: OROMUCOSAL | 0 refills | Status: DC

## 2018-01-15 MED ORDER — NAPROXEN 250 MG PO TABS
500.0000 mg | ORAL_TABLET | Freq: Once | ORAL | Status: AC
  Administered 2018-01-15: 500 mg via ORAL
  Filled 2018-01-15: qty 2

## 2018-01-15 MED ORDER — CLINDAMYCIN HCL 150 MG PO CAPS
300.0000 mg | ORAL_CAPSULE | Freq: Once | ORAL | Status: AC
  Administered 2018-01-15: 300 mg via ORAL
  Filled 2018-01-15: qty 2

## 2018-01-15 NOTE — ED Provider Notes (Signed)
[facility] Provider Note: [name], MD, FACEP  CSN: [id number] MRN: [id number] ARRIVAL: 01/15/18 at 0126 ROOM: MH09/MH09   CHIEF COMPLAINT  Dental Pain   HISTORY OF PRESENT ILLNESS  01/15/18 3:29 AM Aaron Weber is a 47 y.o. male who thinks he got some food caught between his gum in his right upper second molar 2 days ago.  Since then he has had pain and swelling of the lingual soft tissue adjacent to that tooth.  He is having moderate pain.  He has taken ibuprofen with temporary relief.    Past Medical History:  Diagnosis Date  . Diabetes mellitus without complication (HCC)   . GERD (gastroesophageal reflux disease)   . Hypertension   . Pleurisy     Past Surgical History:  Procedure Laterality Date  . TONSILLECTOMY      No family history on file.  Social History   Tobacco Use  . Smoking status: Current Some Day Smoker    Packs/day: 0.50    Types: Cigars  . Smokeless tobacco: Former Engineer, water Use Topics  . Alcohol use: No  . Drug use: No    Prior to Admission medications   Medication Sig Start Date End Date Taking? Authorizing Provider  amLODipine (NORVASC) 5 MG tablet Take 5 mg by mouth daily.    [provider]  bimatoprost (LUMIGAN) 0.03 % ophthalmic solution 1 drop at bedtime.    [provider]  fluticasone (FLONASE) 50 MCG/ACT nasal spray Place 2 sprays into both nostrils daily. 03/17/15   Palumbo, April, MD  ibuprofen (ADVIL,MOTRIN) 800 MG tablet Take 1 tablet (800 mg total) by mouth every 8 (eight) hours as needed for mild pain. 01/14/17   Ward, Layla Maw, DO  loratadine (CLARITIN) 10 MG tablet Take 1 tablet (10 mg total) by mouth daily. 03/05/12 10/26/16  Palumbo, April, MD  metFORMIN (GLUCOPHAGE) 500 MG tablet Take 1 tablet (500 mg total) by mouth 2 (two) times daily with a meal. 02/04/17   Palumbo, April, MD  OMEPRAZOLE PO Take by mouth.    [provider]  traMADol (ULTRAM) 50 MG tablet Take 1 tablet (50  mg total) by mouth every 6 (six) hours as needed. 07/05/17   Geoffery Lyons, MD    Allergies Patient has no known allergies.   REVIEW OF SYSTEMS  Negative except as noted here or in the History of Present Illness.   PHYSICAL EXAMINATION  Initial Vital Signs Blood pressure 128/88, pulse 93, temperature 98.4 F (36.9 C), temperature source Oral, SpO2 96 %.  Examination General: Well-developed, well-nourished male in no acute distress; appearance consistent with age of record HENT: normocephalic; atraumatic; receding gum lines notably on the lingual aspect of the molars.  Tenderness and swelling of the lingual soft tissue adjacent to the right upper second molar Eyes: pupils equal, round and reactive to light; extraocular muscles intact Neck: supple; no lymphadenopathy Heart: regular rate and rhythm Lungs: clear to auscultation bilaterally Abdomen: soft; nondistended  Extremities: No deformity; full range of motion Neurologic: Awake, alert and oriented; motor function intact in all extremities and symmetric; no facial droop Skin: Warm and dry Psychiatric: Flat affect   RESULTS  Summary of this visit's results, reviewed by myself:   EKG Interpretation  Date/Time:    Ventricular Rate:    PR Interval:    QRS Duration:   QT Interval:    QTC Calculation:   R Axis:     Text Interpretation:  Laboratory Studies: No results found for this or any previous visit (from the past 24 hour(s)). Imaging Studies: No results found.  ED COURSE  Nursing notes and initial vitals signs, including pulse oximetry, reviewed.  Vitals:   01/15/18 0132  BP: 128/88  Pulse: 93  Temp: 98.4 F (36.9 C)  TempSrc: Oral  SpO2: 96%   The patient appears to have gingivitis.  It does not appear to be ANUG at this time but we will start him on clindamycin.  We will also have him start Peridex as well.  PROCEDURES    ED DIAGNOSES     ICD-10-CM   1. Acute gingivitis K05.00          Aaron Weber, Aaron RuizJohn, MD 01/15/18 [phone]

## 2018-01-15 NOTE — ED Triage Notes (Signed)
Patient reports that he was eating cereal about 2 days ago and it felt like the food got stuck between his tooth and gums. He c/o pain in the upper right mouth area and swelling in his gums. He took ibuprofen around 2100 for the pain. Denies fevers.

## 2018-03-25 DIAGNOSIS — Z79899 Other long term (current) drug therapy: Secondary | ICD-10-CM | POA: Insufficient documentation

## 2018-03-25 DIAGNOSIS — E119 Type 2 diabetes mellitus without complications: Secondary | ICD-10-CM | POA: Insufficient documentation

## 2018-03-25 DIAGNOSIS — I10 Essential (primary) hypertension: Secondary | ICD-10-CM | POA: Insufficient documentation

## 2018-03-25 DIAGNOSIS — F17290 Nicotine dependence, other tobacco product, uncomplicated: Secondary | ICD-10-CM | POA: Insufficient documentation

## 2018-03-25 DIAGNOSIS — J069 Acute upper respiratory infection, unspecified: Secondary | ICD-10-CM | POA: Insufficient documentation

## 2018-03-25 DIAGNOSIS — Z7984 Long term (current) use of oral hypoglycemic drugs: Secondary | ICD-10-CM | POA: Insufficient documentation

## 2018-03-26 ENCOUNTER — Emergency Department (HOSPITAL_BASED_OUTPATIENT_CLINIC_OR_DEPARTMENT_OTHER)
Admission: EM | Admit: 2018-03-26 | Discharge: 2018-03-26 | Disposition: A | Discharge status: Home / Self Care | Payer: Self-pay | Attending: Emergency Medicine | Admitting: Emergency Medicine

## 2018-03-26 ENCOUNTER — Other Ambulatory Visit: Payer: Self-pay

## 2018-03-26 ENCOUNTER — Encounter (HOSPITAL_BASED_OUTPATIENT_CLINIC_OR_DEPARTMENT_OTHER): Payer: Self-pay | Admitting: *Deleted

## 2018-03-26 ENCOUNTER — Emergency Department (HOSPITAL_BASED_OUTPATIENT_CLINIC_OR_DEPARTMENT_OTHER): Payer: Self-pay

## 2018-03-26 DIAGNOSIS — J069 Acute upper respiratory infection, unspecified: Secondary | ICD-10-CM

## 2018-03-26 DIAGNOSIS — B9789 Other viral agents as the cause of diseases classified elsewhere: Secondary | ICD-10-CM

## 2018-03-26 MED ORDER — PREDNISONE 50 MG PO TABS
60.0000 mg | ORAL_TABLET | Freq: Once | ORAL | Status: AC
  Administered 2018-03-26: 60 mg via ORAL
  Filled 2018-03-26: qty 1

## 2018-03-26 MED ORDER — PREDNISONE 20 MG PO TABS: ORAL_TABLET | ORAL | 0 refills | Status: DC

## 2018-03-26 MED ORDER — BENZONATATE 100 MG PO CAPS
200.0000 mg | ORAL_CAPSULE | Freq: Once | ORAL | Status: AC
  Administered 2018-03-26: 200 mg via ORAL
  Filled 2018-03-26: qty 2

## 2018-03-26 MED ORDER — LORATADINE 10 MG PO TABS: 10.0000 mg | ORAL_TABLET | Freq: Every day | ORAL | 0 refills | Status: DC

## 2018-03-26 MED ORDER — BENZONATATE 100 MG PO CAPS: 100.0000 mg | ORAL_CAPSULE | Freq: Three times a day (TID) | ORAL | 0 refills | Status: DC

## 2018-03-26 MED ORDER — LORATADINE 10 MG PO TABS
10.0000 mg | ORAL_TABLET | Freq: Once | ORAL | Status: AC
  Administered 2018-03-26: 10 mg via ORAL
  Filled 2018-03-26: qty 1

## 2018-03-26 NOTE — ED Provider Notes (Signed)
[facility] EMERGENCY DEPARTMENT Provider Note   CSN: [id number] Arrival date & time: 03/25/18  2358     History   Chief Complaint Chief Complaint  Patient presents with  . URI    HPI Aaron Weber is a 47 y.o. male.  The history is provided by the patient.  URI   This is a new problem. The current episode started more than 1 week ago. The problem has not changed since onset.There has been no fever. Associated symptoms include cough. Pertinent negatives include no chest pain, no abdominal pain, no diarrhea, no nausea, no vomiting, no dysuria, no congestion, no ear pain, no headaches, no plugged ear sensation, no rhinorrhea, no sinus pain, no sneezing, no sore throat, no swollen glands, no joint pain, no joint swelling, no neck pain, no rash and no wheezing. He has tried nothing for the symptoms. The treatment provided no relief.    Past Medical History:  Diagnosis Date  . Diabetes mellitus without complication (HCC)   . GERD (gastroesophageal reflux disease)   . Hypertension   . Pleurisy     There are no active problems to display for this patient.   Past Surgical History:  Procedure Laterality Date  . TONSILLECTOMY          Home Medications    Prior to Admission medications   Medication Sig Start Date End Date Taking? Authorizing Provider  amLODipine (NORVASC) 5 MG tablet Take 5 mg by mouth daily.    [provider]  benzonatate (TESSALON) 100 MG capsule Take 1 capsule (100 mg total) by mouth every 8 (eight) hours. 03/26/18   Reia Viernes, MD  bimatoprost (LUMIGAN) 0.03 % ophthalmic solution 1 drop at bedtime.    [provider]  chlorhexidine (PERIDEX) 0.12 % solution Swish 15 mL around mouth for 30 seconds then spit out.  Do this twice daily. 01/15/18   Molpus, John, MD  clindamycin (CLEOCIN) 300 MG capsule Take 1 capsule (300 mg total) by mouth 4 (four) times daily. X 7 days 01/15/18   Molpus, John, MD  fluticasone (FLONASE) 50  MCG/ACT nasal spray Place 2 sprays into both nostrils daily. 03/17/15   Tyonna Talerico, MD  ibuprofen (ADVIL,MOTRIN) 800 MG tablet Take 1 tablet (800 mg total) by mouth every 8 (eight) hours as needed (for dental pain). 01/15/18   Molpus, John, MD  loratadine (CLARITIN) 10 MG tablet Take 1 tablet (10 mg total) by mouth daily. 03/05/12 10/26/16  Dedra Matsuo, MD  loratadine (CLARITIN) 10 MG tablet Take 1 tablet (10 mg total) by mouth daily. 03/26/18   Riddhi Grether, MD  metFORMIN (GLUCOPHAGE) 500 MG tablet Take 1 tablet (500 mg total) by mouth 2 (two) times daily with a meal. 02/04/17   Carmelita Amparo, MD  OMEPRAZOLE PO Take by mouth.    [provider]  predniSONE (DELTASONE) 20 MG tablet 3 tabs po day one, then 2 po daily x 4 days 03/26/18   Felice Hope, MD  traMADol (ULTRAM) 50 MG tablet Take 1 tablet (50 mg total) by mouth every 6 (six) hours as needed. 07/05/17   Geoffery Lyons, MD    Family History History reviewed. No pertinent family history.  Social History Social History   Tobacco Use  . Smoking status: Current Some Day Smoker    Packs/day: 0.50    Types: Cigars  . Smokeless tobacco: Former Engineer, water Use Topics  . Alcohol use: No  . Drug use: No     Allergies  Patient has no known allergies.   Review of Systems Review of Systems  Constitutional: Negative for diaphoresis and fever.  HENT: Negative for congestion, ear pain, rhinorrhea, sinus pain, sneezing and sore throat.   Respiratory: Positive for cough. Negative for choking, chest tightness, shortness of breath and wheezing.   Cardiovascular: Negative for chest pain, palpitations and leg swelling.  Gastrointestinal: Negative for abdominal pain, diarrhea, nausea and vomiting.  Genitourinary: Negative for dysuria.  Musculoskeletal: Negative for joint pain and neck pain.  Skin: Negative for rash.  Neurological: Negative for headaches.  All other systems reviewed and are negative.    Physical  Exam Updated Vital Signs BP 123/89 (BP Location: Left Arm)   Pulse 97   Temp 98.3 F (36.8 C) (Oral)   Resp 18   Ht 6' (1.829 m)   Wt 100.7 kg (222 lb)   SpO2 98%   BMI 30.11 kg/m   Physical Exam  Constitutional: He is oriented to person, place, and time. He appears well-developed and well-nourished. No distress.  HENT:  Head: Normocephalic and atraumatic.  Mouth/Throat: Oropharynx is clear and moist. No oropharyngeal exudate.  Eyes: Pupils are equal, round, and reactive to light. Conjunctivae are normal.  Neck: Normal range of motion. Neck supple.  Cardiovascular: Normal rate, regular rhythm, normal heart sounds and intact distal pulses.  Pulmonary/Chest: Effort normal and breath sounds normal. No stridor. No respiratory distress. He has no wheezes. He has no rales. He exhibits no tenderness.  Abdominal: Soft. Bowel sounds are normal. He exhibits no mass. There is no tenderness. There is no rebound and no guarding. No hernia.  Musculoskeletal: Normal range of motion.  Neurological: He is alert and oriented to person, place, and time. He displays normal reflexes.  Skin: Skin is warm and dry. Capillary refill takes less than 2 seconds.  Nursing note and vitals reviewed.     Radiology Dg Chest 2 View  Result Date: 03/26/2018 CLINICAL DATA:  Cough EXAM: CHEST - 2 VIEW COMPARISON:  07/05/2017 FINDINGS: The heart size and mediastinal contours are within normal limits. Both lungs are clear. The visualized skeletal structures are unremarkable. IMPRESSION: No active cardiopulmonary disease. Electronically Signed   By: Jasmine PangKim  Fujinaga M.D.   On: 03/26/2018 00:43    Procedures Procedures (including critical care time)  Medications Ordered in ED Medications  benzonatate (TESSALON) capsule 200 mg (200 mg Oral Given 03/26/18 0040)  predniSONE (DELTASONE) tablet 60 mg (60 mg Oral Given 03/26/18 0040)  loratadine (CLARITIN) tablet 10 mg (10 mg Oral Given 03/26/18 0040)       Final  Clinical Impressions(s) / ED Diagnoses   Final diagnoses:  Viral URI with cough    Return for weakness, numbness, changes in vision or speech, fevers >100.4 unrelieved by medication, shortness of breath, intractable vomiting, or diarrhea, abdominal pain, Inability to tolerate liquids or food, cough, altered mental status or any concerns. No signs of systemic illness or infection. The patient is nontoxic-appearing on exam and vital signs are within normal limits.   I have reviewed the triage vital signs and the nursing notes. Pertinent labs &imaging results that were available during my care of the patient were reviewed by me and considered in my medical decision making (see chart for details).  After history, exam, and medical workup I feel the patient has been appropriately medically screened and is safe for discharge home. Pertinent diagnoses were discussed with the patient. Patient was given return precautions.    ED Discharge Orders  Ordered    predniSONE (DELTASONE) 20 MG tablet     03/26/18 0055    loratadine (CLARITIN) 10 MG tablet  Daily     03/26/18 0055    benzonatate (TESSALON) 100 MG capsule  Every 8 hours     03/26/18 0055       Lenvil Swaim, MD 03/26/18 0234

## 2018-03-26 NOTE — ED Triage Notes (Signed)
Pt reports cough x 1 month. Productive with brownish sputum. Denies fever.

## 2018-05-14 ENCOUNTER — Encounter (HOSPITAL_BASED_OUTPATIENT_CLINIC_OR_DEPARTMENT_OTHER): Payer: Self-pay | Admitting: Emergency Medicine

## 2018-05-14 ENCOUNTER — Emergency Department (HOSPITAL_BASED_OUTPATIENT_CLINIC_OR_DEPARTMENT_OTHER)
Admission: EM | Admit: 2018-05-14 | Discharge: 2018-05-15 | Disposition: A | Discharge status: Home / Self Care | Payer: Self-pay | Attending: Emergency Medicine | Admitting: Emergency Medicine

## 2018-05-14 ENCOUNTER — Emergency Department (HOSPITAL_BASED_OUTPATIENT_CLINIC_OR_DEPARTMENT_OTHER): Payer: Self-pay

## 2018-05-14 ENCOUNTER — Other Ambulatory Visit: Payer: Self-pay

## 2018-05-14 DIAGNOSIS — Z79899 Other long term (current) drug therapy: Secondary | ICD-10-CM | POA: Insufficient documentation

## 2018-05-14 DIAGNOSIS — I10 Essential (primary) hypertension: Secondary | ICD-10-CM | POA: Insufficient documentation

## 2018-05-14 DIAGNOSIS — Z7984 Long term (current) use of oral hypoglycemic drugs: Secondary | ICD-10-CM | POA: Insufficient documentation

## 2018-05-14 DIAGNOSIS — E119 Type 2 diabetes mellitus without complications: Secondary | ICD-10-CM | POA: Insufficient documentation

## 2018-05-14 DIAGNOSIS — H5711 Ocular pain, right eye: Secondary | ICD-10-CM | POA: Insufficient documentation

## 2018-05-14 DIAGNOSIS — F17290 Nicotine dependence, other tobacco product, uncomplicated: Secondary | ICD-10-CM | POA: Insufficient documentation

## 2018-05-14 LAB — BASIC METABOLIC PANEL
Anion gap: 10 (ref 5–15)
BUN: 12 mg/dL (ref 6–20)
CO2: 24 mmol/L (ref 22–32)
Calcium: 9.3 mg/dL (ref 8.9–10.3)
Chloride: 103 mmol/L (ref 98–111)
Creatinine, Ser: 0.94 mg/dL (ref 0.61–1.24)
GFR calc Af Amer: >60 mL/min (ref >60)
GFR calc non Af Amer: >60 mL/min (ref >60)
Glucose, Bld: 108 mg/dL — ABNORMAL HIGH (ref 70–99)
Potassium: 4.3 mmol/L (ref 3.5–5.1)
Sodium: 137 mmol/L (ref 135–145)

## 2018-05-14 MED ORDER — TETRACAINE HCL 0.5 % OP SOLN
2.0000 [drp] | Freq: Once | OPHTHALMIC | Status: AC
  Administered 2018-05-14: 2 [drp] via OPHTHALMIC
  Filled 2018-05-14: qty 4

## 2018-05-14 MED ORDER — POLYMYXIN B-TRIMETHOPRIM 10000-0.1 UNIT/ML-% OP SOLN: 1.0000 [drp] | OPHTHALMIC | 0 refills | Status: DC

## 2018-05-14 MED ORDER — HYDROCODONE-ACETAMINOPHEN 5-325 MG PO TABS: 1.0000 | ORAL_TABLET | Freq: Four times a day (QID) | ORAL | 0 refills | Status: DC | PRN

## 2018-05-14 MED ORDER — IOPAMIDOL (ISOVUE-300) INJECTION 61%
100.0000 mL | Freq: Once | INTRAVENOUS | Status: AC | PRN
  Administered 2018-05-14: 75 mL via INTRAVENOUS

## 2018-05-14 MED ORDER — FLUORESCEIN SODIUM 1 MG OP STRP
1.0000 | ORAL_STRIP | Freq: Once | OPHTHALMIC | Status: AC
  Administered 2018-05-14: 1 via OPHTHALMIC
  Filled 2018-05-14: qty 1

## 2018-05-14 NOTE — ED Notes (Signed)
Pt transported to CT ?

## 2018-05-14 NOTE — ED Notes (Signed)
Waiting on labs prior to performing CT w/ contrast due to radiology protocol for pt w/ diabetes

## 2018-05-14 NOTE — ED Provider Notes (Signed)
[facility] EMERGENCY DEPARTMENT Provider Note   CSN: [id number] Arrival date & time: 05/14/18  2040  [id number]   History   Chief Complaint Chief Complaint  Patient presents with  . Eye Pain    HPI Aaron Weber is a 47 y.o. male with diabetes, GERD, hypertension who presents with a one-week history of right eye pain.  He reports a sharp and pressure-like pain that he reports feels like behind his eye.  He is also had irritation in his eye.  He denies any photophobia or vision changes.  His pain is worse when he moves his eye.  He reports taking Lumigan because he has family history of glaucoma but he reports he does not have history of glaucoma himself.  He denies any fevers, or other viral symptoms.  He has taken ibuprofen at home for his pain without relief.  He wears glasses, but not contact lenses.  HPI  Past Medical History:  Diagnosis Date  . Diabetes mellitus without complication (HCC)   . GERD (gastroesophageal reflux disease)   . Hypertension   . Pleurisy     There are no active problems to display for this patient.   Past Surgical History:  Procedure Laterality Date  . TONSILLECTOMY          Home Medications    Prior to Admission medications   Medication Sig Start Date End Date Taking? Authorizing Provider  amLODipine (NORVASC) 5 MG tablet Take 5 mg by mouth daily.    [provider]  benzonatate (TESSALON) 100 MG capsule Take 1 capsule (100 mg total) by mouth every 8 (eight) hours. 03/26/18   Palumbo, April, MD  bimatoprost (LUMIGAN) 0.03 % ophthalmic solution 1 drop at bedtime.    [provider]  chlorhexidine (PERIDEX) 0.12 % solution Swish 15 mL around mouth for 30 seconds then spit out.  Do this twice daily. 01/15/18   Molpus, John, MD  clindamycin (CLEOCIN) 300 MG capsule Take 1 capsule (300 mg total) by mouth 4 (four) times daily. X 7 days 01/15/18   Molpus, John, MD  fluticasone (FLONASE) 50 MCG/ACT nasal spray Place 2 sprays into  both nostrils daily. 03/17/15   Palumbo, April, MD  HYDROcodone-acetaminophen (NORCO/VICODIN) 5-325 MG tablet Take 1 tablet by mouth every 6 (six) hours as needed. 05/14/18   Fermon Ureta, Waylan BogaAlexandra M, PA-C  ibuprofen (ADVIL,MOTRIN) 800 MG tablet Take 1 tablet (800 mg total) by mouth every 8 (eight) hours as needed (for dental pain). 01/15/18   Molpus, John, MD  loratadine (CLARITIN) 10 MG tablet Take 1 tablet (10 mg total) by mouth daily. 03/05/12 10/26/16  Palumbo, April, MD  loratadine (CLARITIN) 10 MG tablet Take 1 tablet (10 mg total) by mouth daily. 03/26/18   Palumbo, April, MD  metFORMIN (GLUCOPHAGE) 500 MG tablet Take 1 tablet (500 mg total) by mouth 2 (two) times daily with a meal. 02/04/17   Palumbo, April, MD  OMEPRAZOLE PO Take by mouth.    [provider]  predniSONE (DELTASONE) 20 MG tablet 3 tabs po day one, then 2 po daily x 4 days 03/26/18   Palumbo, April, MD  traMADol (ULTRAM) 50 MG tablet Take 1 tablet (50 mg total) by mouth every 6 (six) hours as needed. 07/05/17   Geoffery Lyonselo, Douglas, MD  trimethoprim-polymyxin b (POLYTRIM) ophthalmic solution Place 1 drop into the right eye every 4 (four) hours. 05/14/18   Emi HolesLaw, Oyindamola Key M, PA-C    Family History History reviewed. No pertinent family history.  Social  History Social History   Tobacco Use  . Smoking status: Current Some Day Smoker    Packs/day: 0.50    Types: Cigars  . Smokeless tobacco: Former Engineer, water Use Topics  . Alcohol use: No  . Drug use: No     Allergies   Patient has no known allergies.   Review of Systems Review of Systems  Constitutional: Negative for fever.  HENT: Negative for congestion.   Eyes: Positive for pain. Negative for photophobia, redness and visual disturbance.     Physical Exam Updated Vital Signs BP 128/89 (BP Location: Left Arm)   Pulse 95   Temp 98.8 F (37.1 C) (Oral)   Resp 18   Ht 5\' 11"  (1.803 m)   Wt 118.8 kg (262 lb)   SpO2 100%   BMI 36.54 kg/m   Physical Exam    Constitutional: He appears well-developed and well-nourished. No distress.  HENT:  Head: Normocephalic and atraumatic.  Mouth/Throat: Oropharynx is clear and moist. No oropharyngeal exudate.  Eyes: Pupils are equal, round, and reactive to light. Conjunctivae and lids are normal. Right eye exhibits no discharge. Left eye exhibits no discharge. Right conjunctiva is not injected. Left conjunctiva is not injected. No scleral icterus.  EOMs intact, but patient reporting pain to the right eye with movements No significant erythema noted to the periorbital area, tenderness to the infraorbital temporal region Mild ptosis noted to the right eye No no uptake noted on fluorescein exam on the right eye Tonopen pressures average 17 bilaterally   Visual Acuity  Right Eye Distance: 20/15(Uncorrected) Left Eye Distance: 20/40(Uncorrected) Bilateral Distance: 20/20(Uncorrected)   Neck: Normal range of motion. Neck supple. No thyromegaly present.  Cardiovascular: Normal rate, regular rhythm, normal heart sounds and intact distal pulses. Exam reveals no gallop and no friction rub.  No murmur heard. Pulmonary/Chest: Effort normal and breath sounds normal. No stridor. No respiratory distress. He has no wheezes. He has no rales.  Abdominal: Soft. Bowel sounds are normal. He exhibits no distension. There is no tenderness. There is no rebound and no guarding.  Musculoskeletal: He exhibits no edema.  Lymphadenopathy:    He has no cervical adenopathy.  Neurological: He is alert. Coordination normal.  Skin: Skin is warm and dry. No rash noted. He is not diaphoretic. No pallor.  Psychiatric: He has a normal mood and affect.  Nursing note and vitals reviewed.    ED Treatments / Results  Labs (all labs ordered are listed, but only abnormal results are displayed) Labs Reviewed  BASIC METABOLIC PANEL - Abnormal; Notable for the following components:      Result Value   Glucose, Bld 108 (*)    All other  components within normal limits    EKG None  Radiology Ct Orbits W Contrast  Result Date: 05/14/2018 CLINICAL DATA:  RIGHT periorbital bug bite 3 days ago with pain and swelling. EXAM: CT ORBITS WITH CONTRAST TECHNIQUE: Multidetector CT images was performed according to the standard protocol following intravenous contrast administration. CONTRAST:  75mL ISOVUE-300 IOPAMIDOL (ISOVUE-300) INJECTION 61% COMPARISON:  None. FINDINGS: ORBITS: Intact ocular globes. Lenses are located. Normal appearance of the optic nerve sheath complexes. Preservation of the orbital fat. Normal appearance of the extraocular muscles which are well located. Superior ophthalmic veins are not enlarged. SINUSES: Included paranasal sinuses and mastoid air cells are well aerated. INTRACRANIAL CONTENTS: Probable cavum septum pellucidum, less likely absent septum pellucidum. OSSEOUS STRUCTURES/ SOFT TISSUES: No appreciable soft tissue swelling, no subcutaneous gas or radiopaque  foreign bodies. No destructive bony lesions. Scattered periapical dental abscess, incompletely evaluated. IMPRESSION: Negative contrast enhanced CT orbits. Electronically Signed   By: Awilda Metro M.D.   On: 05/14/2018 23:28    Procedures Procedures (including critical care time)  Medications Ordered in ED Medications  tetracaine (PONTOCAINE) 0.5 % ophthalmic solution 2 drop (2 drops Right Eye Given 05/14/18 2127)  fluorescein ophthalmic strip 1 strip (1 strip Right Eye Given 05/14/18 2127)  iopamidol (ISOVUE-300) 61 % injection 100 mL (75 mLs Intravenous Contrast Given 05/14/18 2300)     Initial Impression / Assessment and Plan / ED Course  I have reviewed the triage vital signs and the nursing notes.  Pertinent labs & imaging results that were available during my care of the patient were reviewed by me and considered in my medical decision making (see chart for details).     Patient with a one-week history of right eye pain.  No signs of  cellulitis at this time.  No signs of corneal abrasion, but will cover with ophthalmic antibiotic.  CT of the orbits is negative.  Pressures are normal bilaterally.  No signs of vision loss.  Low suspicion for temporal arteritis at this time.  There are no emergent findings at this time we will refer patient to ophthalmology for further evaluation.  Patient will be discharged home with short course of Norco for pain control and he is advised to alternate with ibuprofen.  I reviewed the [place] narcotic database and found no discrepancies.  Strict return precautions given.  Patient understands and agrees with plan.  Patient vitals stable throughout ED course and discharged in satisfactory condition.  Patient also evaluated by Dr. Clayborne Dana who guided the patient's management and agrees with plan.  Final Clinical Impressions(s) / ED Diagnoses   Final diagnoses:  Pain of right eye    ED Discharge Orders        Ordered    trimethoprim-polymyxin b (POLYTRIM) ophthalmic solution  Every 4 hours     05/14/18 2348    HYDROcodone-acetaminophen (NORCO/VICODIN) 5-325 MG tablet  Every 6 hours PRN     05/14/18 2348       Emi Holes, PA-C 05/14/18 2353    Mesner, Barbara Cower, MD 05/15/18 0003

## 2018-05-14 NOTE — ED Triage Notes (Signed)
Patient states that he has had pain to his right outer eye x 1 week

## 2018-05-14 NOTE — Discharge Instructions (Addendum)
Use Polytrim drops every 4 hours in your right eye for the next 5 days.  You can take 1 Norco every 6 hours as needed for severe pain.  Take ibuprofen as prescribed over-the-counter mild to moderate pain.  Please follow-up with the ophthalmologist, Dr. Cathey EndowBowen, by calling to make an appointment on Monday.  Please return the emergency department if you develop any new or worsening symptoms.  Do not drink alcohol, drive, operate machinery or participate in any other potentially dangerous activities while taking opiate pain medication as it may make you sleepy. Do not take this medication with any other sedating medications, either prescription or over-the-counter. If you were prescribed Percocet or Vicodin, do not take these with acetaminophen (Tylenol) as it is already contained within these medications and overdose of Tylenol is dangerous.   This medication is an opiate (or narcotic) pain medication and can be habit forming.  Use it as little as possible to achieve adequate pain control.  Do not use or use it with extreme caution if you have a history of opiate abuse or dependence. This medication is intended for your use only - do not give any to anyone else and keep it in a secure place where nobody else, especially children, have access to it. It will also cause or worsen constipation, so you may want to consider taking an over-the-counter stool softener while you are taking this medication.

## 2018-07-10 ENCOUNTER — Encounter (HOSPITAL_BASED_OUTPATIENT_CLINIC_OR_DEPARTMENT_OTHER): Payer: Self-pay | Admitting: Adult Health

## 2018-07-10 ENCOUNTER — Other Ambulatory Visit: Payer: Self-pay

## 2018-07-10 DIAGNOSIS — Y9389 Activity, other specified: Secondary | ICD-10-CM | POA: Insufficient documentation

## 2018-07-10 DIAGNOSIS — S01112A Laceration without foreign body of left eyelid and periocular area, initial encounter: Secondary | ICD-10-CM | POA: Insufficient documentation

## 2018-07-10 DIAGNOSIS — Z7984 Long term (current) use of oral hypoglycemic drugs: Secondary | ICD-10-CM | POA: Insufficient documentation

## 2018-07-10 DIAGNOSIS — Y929 Unspecified place or not applicable: Secondary | ICD-10-CM | POA: Insufficient documentation

## 2018-07-10 DIAGNOSIS — Z23 Encounter for immunization: Secondary | ICD-10-CM | POA: Insufficient documentation

## 2018-07-10 DIAGNOSIS — Y999 Unspecified external cause status: Secondary | ICD-10-CM | POA: Insufficient documentation

## 2018-07-10 DIAGNOSIS — Z79899 Other long term (current) drug therapy: Secondary | ICD-10-CM | POA: Insufficient documentation

## 2018-07-10 DIAGNOSIS — S060X0A Concussion without loss of consciousness, initial encounter: Secondary | ICD-10-CM | POA: Insufficient documentation

## 2018-07-10 DIAGNOSIS — I10 Essential (primary) hypertension: Secondary | ICD-10-CM | POA: Insufficient documentation

## 2018-07-10 DIAGNOSIS — F17290 Nicotine dependence, other tobacco product, uncomplicated: Secondary | ICD-10-CM | POA: Insufficient documentation

## 2018-07-10 DIAGNOSIS — E119 Type 2 diabetes mellitus without complications: Secondary | ICD-10-CM | POA: Insufficient documentation

## 2018-07-10 DIAGNOSIS — S0083XA Contusion of other part of head, initial encounter: Secondary | ICD-10-CM | POA: Insufficient documentation
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
## 2018-07-11 ENCOUNTER — Emergency Department (HOSPITAL_BASED_OUTPATIENT_CLINIC_OR_DEPARTMENT_OTHER): Payer: Self-pay

## 2018-07-11 ENCOUNTER — Emergency Department (HOSPITAL_BASED_OUTPATIENT_CLINIC_OR_DEPARTMENT_OTHER)
Admission: EM | Admit: 2018-07-11 | Discharge: 2018-07-11 | Disposition: A | Discharge status: Home / Self Care | Payer: Self-pay | Attending: Emergency Medicine | Admitting: Emergency Medicine

## 2018-07-11 DIAGNOSIS — S060X0A Concussion without loss of consciousness, initial encounter: Secondary | ICD-10-CM

## 2018-07-11 DIAGNOSIS — S0083XA Contusion of other part of head, initial encounter: Secondary | ICD-10-CM

## 2018-07-11 DIAGNOSIS — S01112A Laceration without foreign body of left eyelid and periocular area, initial encounter: Secondary | ICD-10-CM

## 2018-07-11 MED ORDER — TETRACAINE HCL 0.5 % OP SOLN
2.0000 [drp] | Freq: Once | OPHTHALMIC | Status: AC
  Administered 2018-07-11: 2 [drp] via OPHTHALMIC
  Filled 2018-07-11: qty 4

## 2018-07-11 MED ORDER — HYDROCODONE-ACETAMINOPHEN 5-325 MG PO TABS
1.0000 | ORAL_TABLET | Freq: Once | ORAL | Status: AC
  Administered 2018-07-11: 1 via ORAL
  Filled 2018-07-11: qty 1

## 2018-07-11 MED ORDER — FLUORESCEIN SODIUM 1 MG OP STRP
1.0000 | ORAL_STRIP | Freq: Once | OPHTHALMIC | Status: AC
  Administered 2018-07-11: 1 via OPHTHALMIC
  Filled 2018-07-11: qty 1

## 2018-07-11 MED ORDER — TETANUS-DIPHTH-ACELL PERTUSSIS 5-2.5-18.5 LF-MCG/0.5 IM SUSP
0.5000 mL | Freq: Once | INTRAMUSCULAR | Status: AC
  Administered 2018-07-11: 0.5 mL via INTRAMUSCULAR
  Filled 2018-07-11: qty 0.5
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
## 2018-07-11 NOTE — ED Notes (Signed)
Patient verbalizes understanding of discharge instructions. Opportunity for questioning and answers were provided. Armband removed by staff, pt discharged from ED to home via POV  
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
## 2018-08-30 ENCOUNTER — Emergency Department (HOSPITAL_BASED_OUTPATIENT_CLINIC_OR_DEPARTMENT_OTHER)
Admission: EM | Admit: 2018-08-30 | Discharge: 2018-08-30 | Disposition: A | Discharge status: Home / Self Care | Payer: Self-pay | Attending: Emergency Medicine | Admitting: Emergency Medicine

## 2018-08-30 ENCOUNTER — Other Ambulatory Visit: Payer: Self-pay

## 2018-08-30 ENCOUNTER — Encounter (HOSPITAL_BASED_OUTPATIENT_CLINIC_OR_DEPARTMENT_OTHER): Payer: Self-pay | Admitting: *Deleted

## 2018-08-30 DIAGNOSIS — E119 Type 2 diabetes mellitus without complications: Secondary | ICD-10-CM | POA: Insufficient documentation

## 2018-08-30 DIAGNOSIS — F17210 Nicotine dependence, cigarettes, uncomplicated: Secondary | ICD-10-CM | POA: Insufficient documentation

## 2018-08-30 DIAGNOSIS — L298 Other pruritus: Secondary | ICD-10-CM | POA: Insufficient documentation

## 2018-08-30 DIAGNOSIS — Z79899 Other long term (current) drug therapy: Secondary | ICD-10-CM | POA: Insufficient documentation

## 2018-08-30 DIAGNOSIS — Z7984 Long term (current) use of oral hypoglycemic drugs: Secondary | ICD-10-CM | POA: Insufficient documentation

## 2018-08-30 DIAGNOSIS — I10 Essential (primary) hypertension: Secondary | ICD-10-CM | POA: Insufficient documentation

## 2018-08-30 DIAGNOSIS — L299 Pruritus, unspecified: Secondary | ICD-10-CM

## 2018-08-30 MED ORDER — HYDROXYZINE HCL 25 MG PO TABS: 25.0000 mg | ORAL_TABLET | Freq: Four times a day (QID) | ORAL | 0 refills | Status: AC

## 2018-08-30 MED ORDER — HYDROXYZINE HCL 25 MG PO TABS
25.0000 mg | ORAL_TABLET | Freq: Once | ORAL | Status: AC
  Administered 2018-08-30: 25 mg via ORAL

## 2018-08-30 MED ORDER — HYDROXYZINE HCL 25 MG PO TABS
ORAL_TABLET | ORAL | Status: AC
  Filled 2018-08-30: qty 1

## 2018-08-30 NOTE — ED Notes (Signed)
ED Provider at bedside. 

## 2018-08-30 NOTE — ED Provider Notes (Signed)
[facility] EMERGENCY DEPARTMENT Provider Note   CSN: [id number] Arrival date & time: 08/30/18  1910     History   Chief Complaint Chief Complaint  Patient presents with  . Rash    HPI Aaron Carneoriano M Weber is a 47 y.o. male.  The history is provided by the patient.  Illness  This is a recurrent problem. The current episode started more than 2 days ago. The problem occurs daily. The problem has not changed since onset.Pertinent negatives include no chest pain, no abdominal pain, no headaches and no shortness of breath. Nothing aggravates the symptoms. Nothing relieves the symptoms. He has tried nothing (benadryl ) for the symptoms. The treatment provided mild relief.    Past Medical History:  Diagnosis Date  . Diabetes mellitus without complication (HCC)   . GERD (gastroesophageal reflux disease)   . Hypertension   . Pleurisy     There are no active problems to display for this patient.   Past Surgical History:  Procedure Laterality Date  . TONSILLECTOMY          Home Medications    Prior to Admission medications   Medication Sig Start Date End Date Taking? Authorizing Provider  amLODipine (NORVASC) 5 MG tablet Take 5 mg by mouth daily.    [provider]  benzonatate (TESSALON) 100 MG capsule Take 1 capsule (100 mg total) by mouth every 8 (eight) hours. 03/26/18   Palumbo, April, MD  bimatoprost (LUMIGAN) 0.03 % ophthalmic solution 1 drop at bedtime.    [provider]  chlorhexidine (PERIDEX) 0.12 % solution Swish 15 mL around mouth for 30 seconds then spit out.  Do this twice daily. 01/15/18   Molpus, John, MD  clindamycin (CLEOCIN) 300 MG capsule Take 1 capsule (300 mg total) by mouth 4 (four) times daily. X 7 days 01/15/18   Molpus, John, MD  fluticasone (FLONASE) 50 MCG/ACT nasal spray Place 2 sprays into both nostrils daily. 03/17/15   Palumbo, April, MD  HYDROcodone-acetaminophen (NORCO/VICODIN) 5-325 MG tablet Take 1 tablet by mouth every  6 (six) hours as needed. 05/14/18   Law, Waylan BogaAlexandra M, PA-C  hydrOXYzine (ATARAX/VISTARIL) 25 MG tablet Take 1 tablet (25 mg total) by mouth every 6 (six) hours for 5 days. 08/30/18 09/04/18  Adlai Sinning, DO  ibuprofen (ADVIL,MOTRIN) 800 MG tablet Take 1 tablet (800 mg total) by mouth every 8 (eight) hours as needed (for dental pain). 01/15/18   Molpus, John, MD  loratadine (CLARITIN) 10 MG tablet Take 1 tablet (10 mg total) by mouth daily. 03/05/12 10/26/16  Palumbo, April, MD  loratadine (CLARITIN) 10 MG tablet Take 1 tablet (10 mg total) by mouth daily. 03/26/18   Palumbo, April, MD  metFORMIN (GLUCOPHAGE) 500 MG tablet Take 1 tablet (500 mg total) by mouth 2 (two) times daily with a meal. 02/04/17   Palumbo, April, MD  OMEPRAZOLE PO Take by mouth.    [provider]  predniSONE (DELTASONE) 20 MG tablet 3 tabs po day one, then 2 po daily x 4 days 03/26/18   Palumbo, April, MD  traMADol (ULTRAM) 50 MG tablet Take 1 tablet (50 mg total) by mouth every 6 (six) hours as needed. 07/05/17   Geoffery Lyonselo, Douglas, MD  trimethoprim-polymyxin b (POLYTRIM) ophthalmic solution Place 1 drop into the right eye every 4 (four) hours. 05/14/18   Emi HolesLaw, Alexandra M, PA-C    Family History No family history on file.  Social History Social History   Tobacco Use  . Smoking status:  Current Some Day Smoker    Packs/day: 0.50    Types: Cigars  . Smokeless tobacco: Former Engineer, water Use Topics  . Alcohol use: No  . Drug use: No     Allergies   Patient has no known allergies.   Review of Systems Review of Systems  Constitutional: Negative for chills and fever.  HENT: Negative for ear pain and sore throat.   Eyes: Negative for pain and visual disturbance.  Respiratory: Negative for cough and shortness of breath.   Cardiovascular: Negative for chest pain and palpitations.  Gastrointestinal: Negative for abdominal pain and vomiting.  Genitourinary: Negative for dysuria and hematuria.  Musculoskeletal:  Negative for arthralgias and back pain.  Skin: Negative for color change and rash.       itchy  Neurological: Negative for seizures, syncope and headaches.  All other systems reviewed and are negative.    Physical Exam Updated Vital Signs BP 127/81   Pulse 87   Temp 98.3 F (36.8 C) (Oral)   Resp 14   Ht 5\' 11"  (1.803 m)   Wt 101.6 kg   SpO2 99%   BMI 31.24 kg/m   Physical Exam  Constitutional: He appears well-developed and well-nourished.  HENT:  Head: Normocephalic and atraumatic.  Eyes: Pupils are equal, round, and reactive to light. Conjunctivae and EOM are normal.  Neck: Neck supple.  Cardiovascular: Normal rate, regular rhythm, normal heart sounds and intact distal pulses.  No murmur heard. Pulmonary/Chest: Effort normal and breath sounds normal. No respiratory distress.  Abdominal: Soft. There is no tenderness.  Musculoskeletal: Normal range of motion. He exhibits no edema.  Neurological: He is alert.  Skin: Skin is warm and dry. No rash noted. No erythema. No pallor.  Psychiatric: He has a normal mood and affect.  Nursing note and vitals reviewed.    ED Treatments / Results  Labs (all labs ordered are listed, but only abnormal results are displayed) Labs Reviewed - No data to display  EKG None  Radiology No results found.  Procedures Procedures (including critical care time)  Medications Ordered in ED Medications  hydrOXYzine (ATARAX/VISTARIL) tablet 25 mg (25 mg Oral Given 08/30/18 1932)     Initial Impression / Assessment and Plan / ED Course  I have reviewed the triage vital signs and the nursing notes.  Pertinent labs & imaging results that were available during my care of the patient were reviewed by me and considered in my medical decision making (see chart for details).     Aaron Weber is a 47 year old male with history of hypertension, diabetes who presents to the ED with itchy feeling.  Patient with normal vitals.  No fever.   Patient states that he started to use new detergent recently and has been itchy ever since.  No rash.  No fever.  No joint swelling.  No shortness of breath.  No abdominal tenderness, GI symptoms. No concern for anaphylaxis.  Patient overall is well-appearing.  No obvious rash on exam.  States that this has happened before to other soap products.  Has been trying to use Benadryl without much relief.  Will prescribe Atarax.  Given Atarax dose while in the ED.  Recommend that patient wash all of his cloths as I suspect that he continues to wear clothes that were used in the wash with the new soap.  Recommend follow-up with primary care doctor if symptoms do not improve.  No concern for other process at this time.  Told to  return to the ED if symptoms continue.  This chart was dictated using voice recognition software.  Despite best efforts to proofread,  errors can occur which can change the documentation meaning.   Final Clinical Impressions(s) / ED Diagnoses   Final diagnoses:  Itching    ED Discharge Orders         Ordered    hydrOXYzine (ATARAX/VISTARIL) 25 MG tablet  Every 6 hours     08/30/18 1925           Virgina Norfolk, DO 08/30/18 2152

## 2018-08-30 NOTE — ED Notes (Signed)
Per patient- since changing soaps a few days ago pt has been feeling itching all over. D/c the soap. No sob. No hives. NAD noted

## 2018-08-30 NOTE — ED Triage Notes (Signed)
States he has been itching all over since changing soaps 4 days ago. He has been taking Benadryl.

## 2018-09-10 ENCOUNTER — Emergency Department (HOSPITAL_BASED_OUTPATIENT_CLINIC_OR_DEPARTMENT_OTHER): Payer: Self-pay

## 2018-09-10 ENCOUNTER — Encounter (HOSPITAL_BASED_OUTPATIENT_CLINIC_OR_DEPARTMENT_OTHER): Payer: Self-pay | Admitting: *Deleted

## 2018-09-10 ENCOUNTER — Other Ambulatory Visit: Payer: Self-pay

## 2018-09-10 ENCOUNTER — Emergency Department (HOSPITAL_BASED_OUTPATIENT_CLINIC_OR_DEPARTMENT_OTHER)
Admission: EM | Admit: 2018-09-10 | Discharge: 2018-09-10 | Disposition: A | Discharge status: Home / Self Care | Payer: Self-pay | Attending: Emergency Medicine | Admitting: Emergency Medicine

## 2018-09-10 DIAGNOSIS — R1084 Generalized abdominal pain: Secondary | ICD-10-CM

## 2018-09-10 DIAGNOSIS — K5900 Constipation, unspecified: Secondary | ICD-10-CM

## 2018-09-10 DIAGNOSIS — Z7984 Long term (current) use of oral hypoglycemic drugs: Secondary | ICD-10-CM | POA: Insufficient documentation

## 2018-09-10 DIAGNOSIS — F17210 Nicotine dependence, cigarettes, uncomplicated: Secondary | ICD-10-CM | POA: Insufficient documentation

## 2018-09-10 DIAGNOSIS — E119 Type 2 diabetes mellitus without complications: Secondary | ICD-10-CM | POA: Insufficient documentation

## 2018-09-10 DIAGNOSIS — I10 Essential (primary) hypertension: Secondary | ICD-10-CM | POA: Insufficient documentation

## 2018-09-10 DIAGNOSIS — Z79899 Other long term (current) drug therapy: Secondary | ICD-10-CM | POA: Insufficient documentation

## 2018-09-10 LAB — CBC
HCT: 48.2 % (ref 39.0–52.0)
Hemoglobin: 16.3 g/dL (ref 13.0–17.0)
MCH: 31.2 pg (ref 26.0–34.0)
MCHC: 33.8 g/dL (ref 30.0–36.0)
MCV: 92.2 fL (ref 80.0–100.0)
Platelets: 331 10*3/uL (ref 150–400)
RBC: 5.23 MIL/uL (ref 4.22–5.81)
RDW: 14.1 % (ref 11.5–15.5)
WBC: 9.6 10*3/uL (ref 4.0–10.5)
nRBC: 0 % (ref 0.0–0.2)

## 2018-09-10 LAB — URINALYSIS, ROUTINE W REFLEX MICROSCOPIC
Bilirubin Urine: NEGATIVE
Glucose, UA: NEGATIVE mg/dL
HGB URINE DIPSTICK: NEGATIVE
KETONES UR: NEGATIVE mg/dL
Leukocytes, UA: NEGATIVE
NITRITE: NEGATIVE
Protein, ur: NEGATIVE mg/dL
Specific Gravity, Urine: 1.025 (ref 1.005–1.030)
pH: 6 (ref 5.0–8.0)

## 2018-09-10 LAB — COMPREHENSIVE METABOLIC PANEL
ALK PHOS: 65 U/L (ref 38–126)
ALT: 13 U/L (ref 0–44)
AST: 12 U/L — AB (ref 15–41)
Albumin: 4.3 g/dL (ref 3.5–5.0)
Anion gap: 8 (ref 5–15)
BILIRUBIN TOTAL: 0.3 mg/dL (ref 0.3–1.2)
BUN: 15 mg/dL (ref 6–20)
CALCIUM: 9.3 mg/dL (ref 8.9–10.3)
CO2: 26 mmol/L (ref 22–32)
CREATININE: 1.04 mg/dL (ref 0.61–1.24)
Chloride: 102 mmol/L (ref 98–111)
GFR calc Af Amer: >60 mL/min (ref >60)
GFR calc non Af Amer: >60 mL/min (ref >60)
Glucose, Bld: 88 mg/dL (ref 70–99)
Potassium: 3.8 mmol/L (ref 3.5–5.1)
Sodium: 136 mmol/L (ref 135–145)
TOTAL PROTEIN: 7.8 g/dL (ref 6.5–8.1)

## 2018-09-10 LAB — LIPASE, BLOOD: Lipase: 36 U/L (ref 11–51)

## 2018-09-10 MED ORDER — ONDANSETRON 4 MG PO TBDP: 4.0000 mg | ORAL_TABLET | Freq: Three times a day (TID) | ORAL | 0 refills | Status: DC | PRN

## 2018-09-10 MED ORDER — SODIUM CHLORIDE 0.9 % IV BOLUS
1000.0000 mL | Freq: Once | INTRAVENOUS | Status: AC
  Administered 2018-09-10: 1000 mL via INTRAVENOUS

## 2018-09-10 MED ORDER — DICYCLOMINE HCL 20 MG PO TABS: 20.0000 mg | ORAL_TABLET | Freq: Two times a day (BID) | ORAL | 0 refills | Status: DC

## 2018-09-10 NOTE — ED Notes (Signed)
Patient transported to X-ray 

## 2018-09-10 NOTE — ED Triage Notes (Signed)
Abdominal pain and lower back pain x 4 days.

## 2018-09-10 NOTE — ED Provider Notes (Signed)
[facility] EMERGENCY DEPARTMENT Provider Note   CSN: [id number] Arrival date & time: 09/10/18  1632     History   Chief Complaint Chief Complaint  Patient presents with  . Abdominal Pain    HPI Aaron Weber is a 47 y.o. male.  Pt presents to the ED today with abdominal pain and low back pain.  Pt said he felt like he was constipated, so took miralax and magnesium citrate.  He had a bowel movement this morning, but that did not make him feel better.  He has not eaten anything today, but is starting to feel hungry now.  No f/c.     Past Medical History:  Diagnosis Date  . Diabetes mellitus without complication (HCC)   . GERD (gastroesophageal reflux disease)   . Hypertension   . Pleurisy     There are no active problems to display for this patient.   Past Surgical History:  Procedure Laterality Date  . TONSILLECTOMY          Home Medications    Prior to Admission medications   Medication Sig Start Date End Date Taking? Authorizing Provider  amLODipine (NORVASC) 5 MG tablet Take 5 mg by mouth daily.   Yes [provider]  bimatoprost (LUMIGAN) 0.03 % ophthalmic solution 1 drop at bedtime.   Yes [provider]  fluticasone (FLONASE) 50 MCG/ACT nasal spray Place 2 sprays into both nostrils daily. 03/17/15  Yes Palumbo, April, MD  loratadine (CLARITIN) 10 MG tablet Take 1 tablet (10 mg total) by mouth daily. 03/05/12 09/10/18 Yes Palumbo, April, MD  metFORMIN (GLUCOPHAGE) 500 MG tablet Take 1 tablet (500 mg total) by mouth 2 (two) times daily with a meal. 02/04/17  Yes Palumbo, April, MD  benzonatate (TESSALON) 100 MG capsule Take 1 capsule (100 mg total) by mouth every 8 (eight) hours. 03/26/18   Palumbo, April, MD  chlorhexidine (PERIDEX) 0.12 % solution Swish 15 mL around mouth for 30 seconds then spit out.  Do this twice daily. 01/15/18   Molpus, John, MD  clindamycin (CLEOCIN) 300 MG capsule Take 1 capsule (300 mg total) by mouth 4  (four) times daily. X 7 days 01/15/18   Molpus, John, MD  dicyclomine (BENTYL) 20 MG tablet Take 1 tablet (20 mg total) by mouth 2 (two) times daily. 09/10/18   Jacalyn Lefevre, MD  HYDROcodone-acetaminophen (NORCO/VICODIN) 5-325 MG tablet Take 1 tablet by mouth every 6 (six) hours as needed. 05/14/18   Law, Waylan Boga, PA-C  ibuprofen (ADVIL,MOTRIN) 800 MG tablet Take 1 tablet (800 mg total) by mouth every 8 (eight) hours as needed (for dental pain). 01/15/18   Molpus, John, MD  loratadine (CLARITIN) 10 MG tablet Take 1 tablet (10 mg total) by mouth daily. 03/26/18   Palumbo, April, MD  OMEPRAZOLE PO Take by mouth.    [provider]  ondansetron (ZOFRAN ODT) 4 MG disintegrating tablet Take 1 tablet (4 mg total) by mouth every 8 (eight) hours as needed. 09/10/18   Jacalyn Lefevre, MD  predniSONE (DELTASONE) 20 MG tablet 3 tabs po day one, then 2 po daily x 4 days 03/26/18   Palumbo, April, MD  traMADol (ULTRAM) 50 MG tablet Take 1 tablet (50 mg total) by mouth every 6 (six) hours as needed. 07/05/17   Geoffery Lyons, MD  trimethoprim-polymyxin b (POLYTRIM) ophthalmic solution Place 1 drop into the right eye every 4 (four) hours. 05/14/18   Emi Holes, PA-C    Family History No family  history on file.  Social History Social History   Tobacco Use  . Smoking status: Current Some Day Smoker    Packs/day: 0.50    Types: Cigars  . Smokeless tobacco: Former Engineer, waterUser  Substance Use Topics  . Alcohol use: No  . Drug use: No     Allergies   Patient has no known allergies.   Review of Systems Review of Systems  Gastrointestinal: Positive for abdominal pain.  All other systems reviewed and are negative.    Physical Exam Updated Vital Signs BP (!) 122/91   Pulse 86   Temp 98.5 F (36.9 C) (Oral)   Resp 18   Ht 5\' 11"  (1.803 m)   Wt 101.6 kg   SpO2 97%   BMI 31.24 kg/m   Physical Exam  Constitutional: He is oriented to person, place, and time. He appears well-developed and  well-nourished.  HENT:  Head: Normocephalic and atraumatic.  Mouth/Throat: Oropharynx is clear and moist.  Eyes: Pupils are equal, round, and reactive to light. EOM are normal.  Cardiovascular: Normal rate, regular rhythm, normal heart sounds and intact distal pulses.  Pulmonary/Chest: Effort normal and breath sounds normal.  Abdominal: Soft. Normal appearance, normal aorta and bowel sounds are normal. There is generalized tenderness.  Neurological: He is alert and oriented to person, place, and time.  Skin: Skin is warm and dry. Capillary refill takes less than 2 seconds.  Psychiatric: He has a normal mood and affect. His behavior is normal.  Nursing note and vitals reviewed.    ED Treatments / Results  Labs (all labs ordered are listed, but only abnormal results are displayed) Labs Reviewed  COMPREHENSIVE METABOLIC PANEL - Abnormal; Notable for the following components:      Result Value   AST 12 (*)    All other components within normal limits  LIPASE, BLOOD  CBC  URINALYSIS, ROUTINE W REFLEX MICROSCOPIC    EKG None  Radiology Dg Abdomen Acute W/chest  Result Date: 09/10/2018 CLINICAL DATA:  Acute onset of generalized abdominal pain and low back pain that began 5 days ago. EXAM: DG ABDOMEN ACUTE W/ 1V CHEST COMPARISON:  Acute abdomen series 07/05/2017, 02/04/2017 and earlier. Chest x-rays 03/26/2018 and earlier. FINDINGS: Bowel gas pattern unremarkable without evidence of obstruction or significant ileus. No evidence of free air or significant air-fluid levels on the erect image. Expected stool burden in the colon. No visible opaque urinary tract calculi. Slight lumbar levoscoliosis as noted previously. Cardiomediastinal silhouette unremarkable, unchanged. Lungs clear. Bronchovascular markings normal. Pulmonary vascularity normal. No visible pleural effusions. No pneumothorax. Slight thoracic dextroscoliosis as noted previously. IMPRESSION: 1. No acute abdominal abnormality. 2.   No acute cardiopulmonary disease. Electronically Signed   By: Hulan Saashomas  Lawrence M.D.   On: 09/10/2018 17:21    Procedures Procedures (including critical care time)  Medications Ordered in ED Medications  sodium chloride 0.9 % bolus 1,000 mL (1,000 mLs Intravenous New Bag/Given 09/10/18 1726)     Initial Impression / Assessment and Plan / ED Course  I have reviewed the triage vital signs and the nursing notes.  Pertinent labs & imaging results that were available during my care of the patient were reviewed by me and considered in my medical decision making (see chart for details).     Pt is feeling much better after IVFs.  He is not super constipated on xray, so he is encouraged to eat a high fiber diet with lots of water.  Return if worse.  F/u with pcp.  Final Clinical Impressions(s) / ED Diagnoses   Final diagnoses:  Generalized abdominal pain  Constipation, unspecified constipation type    ED Discharge Orders         Ordered    dicyclomine (BENTYL) 20 MG tablet  2 times daily     09/10/18 1800    ondansetron (ZOFRAN ODT) 4 MG disintegrating tablet  Every 8 hours PRN     09/10/18 1800           Jacalyn Lefevre, MD 09/10/18 1803

## 2018-10-02 ENCOUNTER — Other Ambulatory Visit: Payer: Self-pay

## 2018-10-02 ENCOUNTER — Emergency Department (HOSPITAL_BASED_OUTPATIENT_CLINIC_OR_DEPARTMENT_OTHER)
Admission: EM | Admit: 2018-10-02 | Discharge: 2018-10-02 | Disposition: A | Discharge status: Home / Self Care | Payer: Self-pay | Attending: Emergency Medicine | Admitting: Emergency Medicine

## 2018-10-02 ENCOUNTER — Encounter (HOSPITAL_BASED_OUTPATIENT_CLINIC_OR_DEPARTMENT_OTHER): Payer: Self-pay | Admitting: *Deleted

## 2018-10-02 DIAGNOSIS — Z7984 Long term (current) use of oral hypoglycemic drugs: Secondary | ICD-10-CM | POA: Insufficient documentation

## 2018-10-02 DIAGNOSIS — E119 Type 2 diabetes mellitus without complications: Secondary | ICD-10-CM | POA: Insufficient documentation

## 2018-10-02 DIAGNOSIS — J069 Acute upper respiratory infection, unspecified: Secondary | ICD-10-CM | POA: Insufficient documentation

## 2018-10-02 DIAGNOSIS — F17290 Nicotine dependence, other tobacco product, uncomplicated: Secondary | ICD-10-CM | POA: Insufficient documentation

## 2018-10-02 DIAGNOSIS — Z79899 Other long term (current) drug therapy: Secondary | ICD-10-CM | POA: Insufficient documentation

## 2018-10-02 DIAGNOSIS — I10 Essential (primary) hypertension: Secondary | ICD-10-CM | POA: Insufficient documentation

## 2018-10-02 NOTE — Discharge Instructions (Addendum)
Please rest and drink plenty of fluids Continue Nyquil and over the counter medicines for cough and cold Please return if worsening

## 2018-10-02 NOTE — ED Triage Notes (Signed)
Cough, congestion, chills, scratchy throat since yesterday. Took nyquil pta

## 2018-10-02 NOTE — ED Provider Notes (Signed)
[facility] EMERGENCY DEPARTMENT Provider Note   CSN: [id number] Arrival date & time: 10/02/18  2154     History   Chief Complaint Chief Complaint  Patient presents with  . Chills  . Cough    HPI Aaron Carneoriano M Weber is a 47 y.o. male who presents with URI symptoms and a cough. PMH significant for DM, GERD, HTN. He states that yesterday he started to have a scratchy throat. His symptoms have gradually worsened and today he felt much worse. He reports a subjective fever, chills, runny nose/congestion, headache, irritated throat, and a cough. He denies sore throat, abdominal pain, vomiting, diarrhea. He has been taking OTC cough/cold medicine. He has not had a flu shot. He denies sick contacts or recent travel. He is a smoker.  HPI  Past Medical History:  Diagnosis Date  . Diabetes mellitus without complication (HCC)   . GERD (gastroesophageal reflux disease)   . Hypertension   . Pleurisy     There are no active problems to display for this patient.   Past Surgical History:  Procedure Laterality Date  . TONSILLECTOMY          Home Medications    Prior to Admission medications   Medication Sig Start Date End Date Taking? Authorizing Provider  amLODipine (NORVASC) 5 MG tablet Take 5 mg by mouth daily.    [provider]  benzonatate (TESSALON) 100 MG capsule Take 1 capsule (100 mg total) by mouth every 8 (eight) hours. 03/26/18   Palumbo, April, MD  bimatoprost (LUMIGAN) 0.03 % ophthalmic solution 1 drop at bedtime.    [provider]  chlorhexidine (PERIDEX) 0.12 % solution Swish 15 mL around mouth for 30 seconds then spit out.  Do this twice daily. 01/15/18   Molpus, John, MD  clindamycin (CLEOCIN) 300 MG capsule Take 1 capsule (300 mg total) by mouth 4 (four) times daily. X 7 days 01/15/18   Molpus, John, MD  dicyclomine (BENTYL) 20 MG tablet Take 1 tablet (20 mg total) by mouth 2 (two) times daily. 09/10/18   Jacalyn LefevreHaviland, Julie, MD  fluticasone  (FLONASE) 50 MCG/ACT nasal spray Place 2 sprays into both nostrils daily. 03/17/15   Palumbo, April, MD  HYDROcodone-acetaminophen (NORCO/VICODIN) 5-325 MG tablet Take 1 tablet by mouth every 6 (six) hours as needed. 05/14/18   Law, Waylan BogaAlexandra M, PA-C  ibuprofen (ADVIL,MOTRIN) 800 MG tablet Take 1 tablet (800 mg total) by mouth every 8 (eight) hours as needed (for dental pain). 01/15/18   Molpus, John, MD  loratadine (CLARITIN) 10 MG tablet Take 1 tablet (10 mg total) by mouth daily. 03/05/12 09/10/18  Palumbo, April, MD  loratadine (CLARITIN) 10 MG tablet Take 1 tablet (10 mg total) by mouth daily. 03/26/18   Palumbo, April, MD  metFORMIN (GLUCOPHAGE) 500 MG tablet Take 1 tablet (500 mg total) by mouth 2 (two) times daily with a meal. 02/04/17   Palumbo, April, MD  OMEPRAZOLE PO Take by mouth.    [provider]  ondansetron (ZOFRAN ODT) 4 MG disintegrating tablet Take 1 tablet (4 mg total) by mouth every 8 (eight) hours as needed. 09/10/18   Jacalyn LefevreHaviland, Julie, MD  predniSONE (DELTASONE) 20 MG tablet 3 tabs po day one, then 2 po daily x 4 days 03/26/18   Palumbo, April, MD  traMADol (ULTRAM) 50 MG tablet Take 1 tablet (50 mg total) by mouth every 6 (six) hours as needed. 07/05/17   Geoffery Lyonselo, Douglas, MD  trimethoprim-polymyxin b (POLYTRIM) ophthalmic solution Place 1 drop  into the right eye every 4 (four) hours. 05/14/18   Emi HolesLaw, Alexandra M, PA-C    Family History No family history on file.  Social History Social History   Tobacco Use  . Smoking status: Current Some Day Smoker    Packs/day: 0.50    Types: Cigars  . Smokeless tobacco: Former Engineer, waterUser  Substance Use Topics  . Alcohol use: Yes    Comment: rare  . Drug use: No     Allergies   Patient has no known allergies.   Review of Systems Review of Systems  Constitutional: Positive for chills and fever.  HENT: Positive for congestion, rhinorrhea and sore throat. Negative for ear pain.   Respiratory: Positive for cough. Negative for shortness  of breath and wheezing.   Gastrointestinal: Negative for abdominal pain, nausea and vomiting.  All other systems reviewed and are negative.    Physical Exam Updated Vital Signs BP (!) 146/93 (BP Location: Left Arm)   Pulse (!) 118   Temp 99.9 F (37.7 C) (Oral)   Resp (!) 22   Ht 5\' 11"  (1.803 m)   Wt 99.8 kg   SpO2 98%   BMI 30.68 kg/m   Physical Exam Vitals signs and nursing note reviewed.  Constitutional:      General: He is not in acute distress.    Appearance: Normal appearance. He is well-developed.     Comments: Calm, cooperative. Appears mildly ill  HENT:     Head: Normocephalic and atraumatic.     Right Ear: Tympanic membrane normal.     Left Ear: Tympanic membrane normal.     Nose: Congestion present.     Mouth/Throat:     Mouth: Mucous membranes are moist.     Pharynx: Posterior oropharyngeal erythema present. No oropharyngeal exudate.  Eyes:     General: No scleral icterus.       Right eye: No discharge.        Left eye: No discharge.     Conjunctiva/sclera: Conjunctivae normal.     Pupils: Pupils are equal, round, and reactive to light.  Neck:     Musculoskeletal: Normal range of motion.  Cardiovascular:     Rate and Rhythm: Tachycardia present.  Pulmonary:     Effort: Pulmonary effort is normal. No respiratory distress.     Breath sounds: Normal breath sounds.  Abdominal:     General: There is no distension.  Skin:    General: Skin is warm and dry.  Neurological:     Mental Status: He is alert and oriented to person, place, and time.  Psychiatric:        Behavior: Behavior normal.      ED Treatments / Results  Labs (all labs ordered are listed, but only abnormal results are displayed) Labs Reviewed - No data to display  EKG None  Radiology No results found.  Procedures Procedures (including critical care time)  Medications Ordered in ED Medications - No data to display   Initial Impression / Assessment and Plan / ED Course  I  have reviewed the triage vital signs and the nursing notes.  Pertinent labs & imaging results that were available during my care of the patient were reviewed by me and considered in my medical decision making (see chart for details).  47 year old male presents with URI symptoms. He is mildly tachycardic but otherwise vitals are normal. Exam is consistent with viral URI. Less likely flu or pneumonia. He is requesting antibiotics. I told him  that this is not indicated at this time. Advised continue supportive care and to return if worsening or not improving after a couple days.  Final Clinical Impressions(s) / ED Diagnoses   Final diagnoses:  Upper respiratory tract infection, unspecified type    ED Discharge Orders    None       [name], PA-C 10/02/18 2308    Jacalyn Lefevre, MD 10/02/18 2316

## 2018-10-22 ENCOUNTER — Encounter (HOSPITAL_BASED_OUTPATIENT_CLINIC_OR_DEPARTMENT_OTHER): Payer: Self-pay | Admitting: Emergency Medicine

## 2018-10-22 ENCOUNTER — Other Ambulatory Visit: Payer: Self-pay

## 2018-10-22 ENCOUNTER — Emergency Department (HOSPITAL_BASED_OUTPATIENT_CLINIC_OR_DEPARTMENT_OTHER)
Admission: EM | Admit: 2018-10-22 | Discharge: 2018-10-22 | Disposition: A | Discharge status: Home / Self Care | Payer: Self-pay | Attending: Emergency Medicine | Admitting: Emergency Medicine

## 2018-10-22 DIAGNOSIS — F17290 Nicotine dependence, other tobacco product, uncomplicated: Secondary | ICD-10-CM | POA: Insufficient documentation

## 2018-10-22 DIAGNOSIS — Z79899 Other long term (current) drug therapy: Secondary | ICD-10-CM | POA: Insufficient documentation

## 2018-10-22 DIAGNOSIS — I10 Essential (primary) hypertension: Secondary | ICD-10-CM | POA: Insufficient documentation

## 2018-10-22 DIAGNOSIS — E119 Type 2 diabetes mellitus without complications: Secondary | ICD-10-CM | POA: Insufficient documentation

## 2018-10-22 DIAGNOSIS — Z7984 Long term (current) use of oral hypoglycemic drugs: Secondary | ICD-10-CM | POA: Insufficient documentation

## 2018-10-22 DIAGNOSIS — B9789 Other viral agents as the cause of diseases classified elsewhere: Secondary | ICD-10-CM | POA: Insufficient documentation

## 2018-10-22 DIAGNOSIS — J069 Acute upper respiratory infection, unspecified: Secondary | ICD-10-CM | POA: Insufficient documentation

## 2018-10-22 MED ORDER — ALBUTEROL SULFATE HFA 108 (90 BASE) MCG/ACT IN AERS
2.0000 | INHALATION_SPRAY | RESPIRATORY_TRACT | Status: DC | PRN
  Filled 2018-10-22: qty 6.7

## 2018-10-22 MED ORDER — BENZONATATE 100 MG PO CAPS: 100.0000 mg | ORAL_CAPSULE | Freq: Three times a day (TID) | ORAL | 0 refills | Status: DC

## 2018-10-22 NOTE — ED Triage Notes (Signed)
Pt c/o persistent cough for the past 3 days.

## 2018-10-22 NOTE — ED Provider Notes (Signed)
[facility] EMERGENCY DEPARTMENT Provider Note   CSN: [id number] Arrival date & time: 10/22/18  0419     History   Chief Complaint Chief Complaint  Patient presents with  . Cough    HPI Aaron Carneoriano M Weber is a 48 y.o. male.  The history is provided by the patient.  Cough  Severity:  Moderate Onset quality:  Gradual Duration:  3 days Timing:  Intermittent Progression:  Unchanged Chronicity:  New Smoker: yes   Context: not animal exposure   Relieved by:  Nothing Worsened by:  Nothing Ineffective treatments:  None tried Associated symptoms: rhinorrhea and sinus congestion   Associated symptoms: no chest pain, no fever and no shortness of breath   Risk factors: no chemical exposure     Past Medical History:  Diagnosis Date  . Diabetes mellitus without complication (HCC)   . GERD (gastroesophageal reflux disease)   . Hypertension   . Pleurisy     There are no active problems to display for this patient.   Past Surgical History:  Procedure Laterality Date  . TONSILLECTOMY          Home Medications    Prior to Admission medications   Medication Sig Start Date End Date Taking? Authorizing Provider  amLODipine (NORVASC) 5 MG tablet Take 5 mg by mouth daily.    [provider]  benzonatate (TESSALON) 100 MG capsule Take 1 capsule (100 mg total) by mouth every 8 (eight) hours. 03/26/18   Mai Longnecker, MD  bimatoprost (LUMIGAN) 0.03 % ophthalmic solution 1 drop at bedtime.    [provider]  chlorhexidine (PERIDEX) 0.12 % solution Swish 15 mL around mouth for 30 seconds then spit out.  Do this twice daily. 01/15/18   Molpus, John, MD  clindamycin (CLEOCIN) 300 MG capsule Take 1 capsule (300 mg total) by mouth 4 (four) times daily. X 7 days 01/15/18   Molpus, John, MD  dicyclomine (BENTYL) 20 MG tablet Take 1 tablet (20 mg total) by mouth 2 (two) times daily. 09/10/18   Jacalyn LefevreHaviland, Julie, MD  fluticasone (FLONASE) 50 MCG/ACT nasal spray Place  2 sprays into both nostrils daily. 03/17/15   Tamora Huneke, MD  HYDROcodone-acetaminophen (NORCO/VICODIN) 5-325 MG tablet Take 1 tablet by mouth every 6 (six) hours as needed. 05/14/18   Law, Waylan BogaAlexandra M, PA-C  ibuprofen (ADVIL,MOTRIN) 800 MG tablet Take 1 tablet (800 mg total) by mouth every 8 (eight) hours as needed (for dental pain). 01/15/18   Molpus, John, MD  loratadine (CLARITIN) 10 MG tablet Take 1 tablet (10 mg total) by mouth daily. 03/05/12 09/10/18  Marquel Spoto, MD  loratadine (CLARITIN) 10 MG tablet Take 1 tablet (10 mg total) by mouth daily. 03/26/18   Rui Wordell, MD  metFORMIN (GLUCOPHAGE) 500 MG tablet Take 1 tablet (500 mg total) by mouth 2 (two) times daily with a meal. 02/04/17   Jeffery Bachmeier, MD  OMEPRAZOLE PO Take by mouth.    [provider]  ondansetron (ZOFRAN ODT) 4 MG disintegrating tablet Take 1 tablet (4 mg total) by mouth every 8 (eight) hours as needed. 09/10/18   Jacalyn LefevreHaviland, Julie, MD  predniSONE (DELTASONE) 20 MG tablet 3 tabs po day one, then 2 po daily x 4 days 03/26/18   Iliani Vejar, MD  traMADol (ULTRAM) 50 MG tablet Take 1 tablet (50 mg total) by mouth every 6 (six) hours as needed. 07/05/17   Geoffery Lyonselo, Douglas, MD  trimethoprim-polymyxin b (POLYTRIM) ophthalmic solution Place 1 drop into the right eye  every 4 (four) hours. 05/14/18   Emi Holes, PA-C    Family History No family history on file.  Social History Social History   Tobacco Use  . Smoking status: Current Some Day Smoker    Packs/day: 0.50    Types: Cigars  . Smokeless tobacco: Former Engineer, water Use Topics  . Alcohol use: Yes    Comment: rare  . Drug use: No     Allergies   Patient has no known allergies.   Review of Systems Review of Systems  Constitutional: Negative for fever.  HENT: Positive for rhinorrhea.   Respiratory: Positive for cough. Negative for shortness of breath.   Cardiovascular: Negative for chest pain.  All other systems reviewed and are  negative.    Physical Exam Updated Vital Signs BP 136/89 (BP Location: Right Arm)   Pulse 92   Temp 98.4 F (36.9 C) (Oral)   Resp 18   Ht 5\' 11"  (1.803 m)   Wt 99.3 kg   SpO2 100%   BMI 30.54 kg/m   Physical Exam Constitutional:      General: He is not in acute distress.    Appearance: Normal appearance. He is not ill-appearing, toxic-appearing or diaphoretic.  HENT:     Head: Normocephalic and atraumatic.  Eyes:     Extraocular Movements: Extraocular movements intact.  Neck:     Musculoskeletal: Normal range of motion and neck supple.  Cardiovascular:     Rate and Rhythm: Normal rate and regular rhythm.     Heart sounds: Normal heart sounds.  Pulmonary:     Effort: Pulmonary effort is normal. No respiratory distress.     Breath sounds: Normal breath sounds. No stridor. No wheezing, rhonchi or rales.  Chest:     Chest wall: No tenderness.  Abdominal:     General: Abdomen is flat. Bowel sounds are normal.     Tenderness: There is no abdominal tenderness.  Musculoskeletal: Normal range of motion.  Skin:    General: Skin is warm and dry.     Capillary Refill: Capillary refill takes less than 2 seconds.  Neurological:     General: No focal deficit present.     Mental Status: He is alert and oriented to person, place, and time.  Psychiatric:        Mood and Affect: Mood normal.      ED Treatments / Results  Labs (all labs ordered are listed, but only abnormal results are displayed) Labs Reviewed - No data to display  EKG None  Radiology No results found.  Procedures Procedures (including critical care time)  Medications Ordered in ED Medications - No data to display   Initial Impression / Assessment and Plan / ED Course  I have reviewed the triage vital signs and the nursing notes.  Pertinent labs & imaging results that were available during my care of the patient were reviewed by me and considered in my medical decision making (see chart for  details).      Final Clinical Impressions(s) / ED Diagnoses  Lungs are clear and vitals are normal.  There is no indication for antibiotics at this time and no clinical signs of pneumonia.  We will treat symptomatically with tessalon and an inhaler which was provided in the ED.    Return for pain, intractable cough, productive cough,fevers >100.4 unrelieved by medication, shortness of breath, intractable vomiting, or diarrhea, abdominal pain, Inability to tolerate liquids or food, cough, altered mental status or any concerns.  No signs of systemic illness or infection. The patient is nontoxic-appearing on exam and vital signs are within normal limits.   I have reviewed the triage vital signs and the nursing notes. Pertinent labs &imaging results that were available during my care of the patient were reviewed by me and considered in my medical decision making (see chart for details).  After history, exam, and medical workup I feel the patient has been appropriately medically screened and is safe for discharge home. Pertinent diagnoses were discussed with the patient. Patient was given return precautions.   Kewanda Poland, MD 10/22/18 [phone]

## 2018-11-22 ENCOUNTER — Other Ambulatory Visit: Payer: Self-pay

## 2018-11-22 ENCOUNTER — Encounter (HOSPITAL_BASED_OUTPATIENT_CLINIC_OR_DEPARTMENT_OTHER): Payer: Self-pay

## 2018-11-22 DIAGNOSIS — E119 Type 2 diabetes mellitus without complications: Secondary | ICD-10-CM | POA: Insufficient documentation

## 2018-11-22 DIAGNOSIS — Z79899 Other long term (current) drug therapy: Secondary | ICD-10-CM | POA: Insufficient documentation

## 2018-11-22 DIAGNOSIS — R1084 Generalized abdominal pain: Secondary | ICD-10-CM | POA: Insufficient documentation

## 2018-11-22 DIAGNOSIS — F17210 Nicotine dependence, cigarettes, uncomplicated: Secondary | ICD-10-CM | POA: Insufficient documentation

## 2018-11-22 DIAGNOSIS — Z7984 Long term (current) use of oral hypoglycemic drugs: Secondary | ICD-10-CM | POA: Insufficient documentation

## 2018-11-22 DIAGNOSIS — I10 Essential (primary) hypertension: Secondary | ICD-10-CM | POA: Insufficient documentation

## 2018-11-22 NOTE — ED Triage Notes (Signed)
Pt c/o abd pain and bloating x1 week. Pt states he has drank multiple doses of mag citrate, taken stool softeners, and Miralax. Pt having multiple BMs, but no relief of abd pain. Denies N/V, fever.

## 2018-11-23 ENCOUNTER — Emergency Department (HOSPITAL_BASED_OUTPATIENT_CLINIC_OR_DEPARTMENT_OTHER): Payer: Self-pay

## 2018-11-23 ENCOUNTER — Emergency Department (HOSPITAL_BASED_OUTPATIENT_CLINIC_OR_DEPARTMENT_OTHER)
Admission: EM | Admit: 2018-11-23 | Discharge: 2018-11-23 | Disposition: A | Discharge status: Home / Self Care | Payer: Self-pay | Attending: Emergency Medicine | Admitting: Emergency Medicine

## 2018-11-23 DIAGNOSIS — R1084 Generalized abdominal pain: Secondary | ICD-10-CM

## 2018-11-23 LAB — URINALYSIS, ROUTINE W REFLEX MICROSCOPIC
Bilirubin Urine: NEGATIVE
Glucose, UA: NEGATIVE mg/dL
Hgb urine dipstick: NEGATIVE
Ketones, ur: NEGATIVE mg/dL
Leukocytes, UA: NEGATIVE
NITRITE: NEGATIVE
PROTEIN: NEGATIVE mg/dL
Specific Gravity, Urine: 1.02 (ref 1.005–1.030)
pH: 6 (ref 5.0–8.0)

## 2018-11-23 LAB — CBC WITH DIFFERENTIAL/PLATELET
Abs Immature Granulocytes: 0.03 10*3/uL (ref 0.00–0.07)
Basophils Absolute: 0.1 10*3/uL (ref 0.0–0.1)
Basophils Relative: 1 %
Eosinophils Absolute: 0.5 10*3/uL (ref 0.0–0.5)
Eosinophils Relative: 4 %
HCT: 52.2 % — ABNORMAL HIGH (ref 39.0–52.0)
Hemoglobin: 17.4 g/dL — ABNORMAL HIGH (ref 13.0–17.0)
Immature Granulocytes: 0 %
Lymphocytes Relative: 43 %
Lymphs Abs: 4.5 10*3/uL — ABNORMAL HIGH (ref 0.7–4.0)
MCH: 30.8 pg (ref 26.0–34.0)
MCHC: 33.3 g/dL (ref 30.0–36.0)
MCV: 92.4 fL (ref 80.0–100.0)
Monocytes Absolute: 0.7 10*3/uL (ref 0.1–1.0)
Monocytes Relative: 7 %
Neutro Abs: 4.6 10*3/uL (ref 1.7–7.7)
Neutrophils Relative %: 45 %
Platelets: 329 10*3/uL (ref 150–400)
RBC: 5.65 MIL/uL (ref 4.22–5.81)
RDW: 14 % (ref 11.5–15.5)
WBC: 10.3 10*3/uL (ref 4.0–10.5)
nRBC: 0 % (ref 0.0–0.2)

## 2018-11-23 LAB — COMPREHENSIVE METABOLIC PANEL
ALT: 17 U/L (ref 0–44)
AST: 16 U/L (ref 15–41)
Albumin: 4.4 g/dL (ref 3.5–5.0)
Alkaline Phosphatase: 85 U/L (ref 38–126)
Anion gap: 9 (ref 5–15)
BUN: 13 mg/dL (ref 6–20)
CO2: 25 mmol/L (ref 22–32)
Calcium: 9.4 mg/dL (ref 8.9–10.3)
Chloride: 101 mmol/L (ref 98–111)
Creatinine, Ser: 0.98 mg/dL (ref 0.61–1.24)
GFR calc non Af Amer: >60 mL/min (ref >60)
Glucose, Bld: 89 mg/dL (ref 70–99)
Potassium: 4 mmol/L (ref 3.5–5.1)
Sodium: 135 mmol/L (ref 135–145)
Total Bilirubin: 0.2 mg/dL — ABNORMAL LOW (ref 0.3–1.2)
Total Protein: 8.2 g/dL — ABNORMAL HIGH (ref 6.5–8.1)

## 2018-11-23 LAB — LIPASE, BLOOD: Lipase: 46 U/L (ref 11–51)

## 2018-11-23 MED ORDER — FENTANYL CITRATE (PF) 100 MCG/2ML IJ SOLN
100.0000 ug | Freq: Once | INTRAMUSCULAR | Status: AC
Start: 2018-11-23 — End: 2018-11-23
  Administered 2018-11-23: 100 ug via INTRAVENOUS
  Filled 2018-11-23: qty 2

## 2018-11-23 MED ORDER — DICYCLOMINE HCL 10 MG PO CAPS
10.0000 mg | ORAL_CAPSULE | Freq: Once | ORAL | Status: AC
  Administered 2018-11-23: 10 mg via ORAL
  Filled 2018-11-23: qty 1

## 2018-11-23 MED ORDER — IOPAMIDOL (ISOVUE-300) INJECTION 61%
100.0000 mL | Freq: Once | INTRAVENOUS | Status: AC | PRN
  Administered 2018-11-23: 100 mL via INTRAVENOUS

## 2018-11-23 MED ORDER — DICYCLOMINE HCL 20 MG PO TABS: 20.0000 mg | ORAL_TABLET | Freq: Two times a day (BID) | ORAL | 0 refills | Status: DC

## 2018-11-23 NOTE — ED Provider Notes (Signed)
[facility] EMERGENCY DEPARTMENT Provider Note   CSN: [id number] Arrival date & time: 11/22/18  2333     History   Chief Complaint Chief Complaint  Patient presents with  . Abdominal Pain    HPI Aaron Carneoriano M Weber is a 48 y.o. male.  The history is provided by the patient.  Abdominal Pain  Pain location:  Generalized Pain quality: pressure   Pain radiates to:  Back Pain severity:  Moderate Onset quality:  Gradual Duration:  1 week Timing:  Constant Progression:  Worsening Chronicity:  New Relieved by:  Nothing Worsened by:  Movement and palpation Associated symptoms: cough and nausea   Associated symptoms: no dysuria, no fever, no hematemesis, no hematochezia, no melena and no vomiting    Patient with history of diabetes and hypertension presents with abdominal pain.  He reports his abdominal pressure and pain started last week.  He thought he was constipated and he started taking MiraLAX and mag citrate.  He began to move his bowels, but his symptoms did not improve.  He reports multiple bowel movements that are nonbloody, but still having abdominal pressure. Past Medical History:  Diagnosis Date  . Diabetes mellitus without complication (HCC)   . GERD (gastroesophageal reflux disease)   . Hypertension   . Pleurisy     There are no active problems to display for this patient.   Past Surgical History:  Procedure Laterality Date  . TONSILLECTOMY          Home Medications    Prior to Admission medications   Medication Sig Start Date End Date Taking? Authorizing Provider  amLODipine (NORVASC) 5 MG tablet Take 5 mg by mouth daily.    [provider]  bimatoprost (LUMIGAN) 0.03 % ophthalmic solution 1 drop at bedtime.    [provider]  fluticasone (FLONASE) 50 MCG/ACT nasal spray Place 2 sprays into both nostrils daily. 03/17/15   Palumbo, April, MD  loratadine (CLARITIN) 10 MG tablet Take 1 tablet (10 mg total) by mouth daily.  03/05/12 09/10/18  Palumbo, April, MD  loratadine (CLARITIN) 10 MG tablet Take 1 tablet (10 mg total) by mouth daily. 03/26/18   Palumbo, April, MD  metFORMIN (GLUCOPHAGE) 500 MG tablet Take 1 tablet (500 mg total) by mouth 2 (two) times daily with a meal. 02/04/17   Palumbo, April, MD  OMEPRAZOLE PO Take by mouth.    [provider]    Family History No family history on file.  Social History Social History   Tobacco Use  . Smoking status: Current Some Day Smoker    Packs/day: 0.50    Types: Cigars  . Smokeless tobacco: Former Engineer, waterUser  Substance Use Topics  . Alcohol use: Yes    Comment: rare  . Drug use: No     Allergies   Patient has no known allergies.   Review of Systems Review of Systems  Constitutional: Negative for fever.  Respiratory: Positive for cough.   Gastrointestinal: Positive for abdominal pain and nausea. Negative for hematemesis, hematochezia, melena and vomiting.  Genitourinary: Negative for dysuria and testicular pain.  All other systems reviewed and are negative.    Physical Exam Updated Vital Signs BP 114/83 (BP Location: Right Arm)   Pulse 85   Temp 98.2 F (36.8 C) (Oral)   Resp 18   Ht 1.816 m (5' 11.5")   Wt 99.8 kg   SpO2 98%   BMI 30.26 kg/m   Physical Exam  CONSTITUTIONAL: Well developed/well nourished HEAD: Normocephalic/atraumatic  EYES: EOMI/PERRL, no icterus ENMT: Mucous membranes moist NECK: supple no meningeal signs SPINE/BACK:entire spine nontender CV: S1/S2 noted, no murmurs/rubs/gallops noted LUNGS: Lungs are clear to auscultation bilaterally, no apparent distress ABDOMEN: soft, diffuse tenderness, with moderate RLQ tenderness noted, no rebound or guarding, bowel sounds noted throughout abdomen GU:no cva tenderness NEURO: Pt is awake/alert/appropriate, moves all extremitiesx4.  No facial droop.   EXTREMITIES: pulses normal/equal, full ROM SKIN: warm, color normal PSYCH: no abnormalities of mood noted, alert and  oriented to situation  ED Treatments / Results  Labs (all labs ordered are listed, but only abnormal results are displayed) Labs Reviewed  CBC WITH DIFFERENTIAL/PLATELET - Abnormal; Notable for the following components:      Result Value   Hemoglobin 17.4 (*)    HCT 52.2 (*)    Lymphs Abs 4.5 (*)    All other components within normal limits  COMPREHENSIVE METABOLIC PANEL - Abnormal; Notable for the following components:   Total Protein 8.2 (*)    Total Bilirubin 0.2 (*)    All other components within normal limits  LIPASE, BLOOD  URINALYSIS, ROUTINE W REFLEX MICROSCOPIC    EKG None  Radiology Ct Abdomen Pelvis W Contrast  Result Date: 11/23/2018 CLINICAL DATA:  48 year old male with history of abdominal bloating and pain for 1 week. EXAM: CT ABDOMEN AND PELVIS WITH CONTRAST TECHNIQUE: Multidetector CT imaging of the abdomen and pelvis was performed using the standard protocol following bolus administration of intravenous contrast. CONTRAST:  ISOVUE-300 IOPAMIDOL (ISOVUE-300) INJECTION 61% COMPARISON:  CT the abdomen and pelvis 07/24/2015. FINDINGS: Lower chest: Unremarkable. Hepatobiliary: No suspicious cystic or solid hepatic lesions. No intra or extrahepatic biliary ductal dilatation. Gallbladder is normal in appearance. Pancreas: No pancreatic mass. No pancreatic ductal dilatation. No pancreatic or peripancreatic fluid or inflammatory changes. Spleen: Unremarkable. Adrenals/Urinary Tract: 1.6 cm simple cyst in the interpolar region of the right kidney. Subcentimeter low-attenuation lesion in the interpolar region of the left kidney, too small to characterize, but statistically likely to represent a tiny cyst. No suspicious renal lesions. No hydroureteronephrosis. Urinary bladder is normal in appearance. Bilateral adrenal glands are normal in appearance. Stomach/Bowel: Normal appearance of the stomach. No pathologic dilatation of small bowel or colon. Normal appendix.  Vascular/Lymphatic: Aortic atherosclerosis, without evidence of aneurysm or dissection in the abdominal or pelvic vasculature. No lymphadenopathy noted in the abdomen or pelvis. Reproductive: Prostate gland and seminal vesicles are unremarkable in appearance. Other: No significant volume of ascites.  No pneumoperitoneum. Musculoskeletal: There are no aggressive appearing lytic or blastic lesions noted in the visualized portions of the skeleton. IMPRESSION: 1. No acute findings are noted in the abdomen or pelvis to account for the patient's symptoms. 2. Aortic atherosclerosis. Electronically Signed   By: Trudie Reed M.D.   On: 11/23/2018 04:24    Procedures Procedures   Medications Ordered in ED Medications  fentaNYL (SUBLIMAZE) injection 100 mcg (100 mcg Intravenous Given 11/23/18 0331)  iopamidol (ISOVUE-300) 61 % injection 100 mL (100 mLs Intravenous Contrast Given 11/23/18 0406)  dicyclomine (BENTYL) capsule 10 mg (10 mg Oral Given 11/23/18 1610)     Initial Impression / Assessment and Plan / ED Course  I have reviewed the triage vital signs and the nursing notes.  Pertinent labs & imaging results that were available during my care of the patient were reviewed by me and considered in my medical decision making (see chart for details).     3:14 AM Patient with continued abdominal pain and pressure over  the past week.  Will obtain CT imaging.    Patient is improved.  CT imaging is negative.  He reports some continued abdominal cramping, but is overall improved.  He had no chest complaints. Will start Bentyl, refer to gastroenterology.  Patient stable for discharge Final Clinical Impressions(s) / ED Diagnoses   Final diagnoses:  Generalized abdominal pain    ED Discharge Orders         Ordered    dicyclomine (BENTYL) 20 MG tablet  2 times daily     11/23/18 0505           Zadie Rhine, MD 11/23/18 [phone]

## 2018-12-09 ENCOUNTER — Emergency Department (HOSPITAL_BASED_OUTPATIENT_CLINIC_OR_DEPARTMENT_OTHER): Payer: Self-pay

## 2018-12-09 ENCOUNTER — Emergency Department (HOSPITAL_BASED_OUTPATIENT_CLINIC_OR_DEPARTMENT_OTHER)
Admission: EM | Admit: 2018-12-09 | Discharge: 2018-12-09 | Disposition: A | Discharge status: Home / Self Care | Payer: Self-pay | Attending: Emergency Medicine | Admitting: Emergency Medicine

## 2018-12-09 ENCOUNTER — Other Ambulatory Visit: Payer: Self-pay

## 2018-12-09 ENCOUNTER — Encounter (HOSPITAL_BASED_OUTPATIENT_CLINIC_OR_DEPARTMENT_OTHER): Payer: Self-pay | Admitting: *Deleted

## 2018-12-09 DIAGNOSIS — Z79899 Other long term (current) drug therapy: Secondary | ICD-10-CM | POA: Insufficient documentation

## 2018-12-09 DIAGNOSIS — Z7984 Long term (current) use of oral hypoglycemic drugs: Secondary | ICD-10-CM | POA: Insufficient documentation

## 2018-12-09 DIAGNOSIS — F17210 Nicotine dependence, cigarettes, uncomplicated: Secondary | ICD-10-CM | POA: Insufficient documentation

## 2018-12-09 DIAGNOSIS — I10 Essential (primary) hypertension: Secondary | ICD-10-CM | POA: Insufficient documentation

## 2018-12-09 DIAGNOSIS — E119 Type 2 diabetes mellitus without complications: Secondary | ICD-10-CM | POA: Insufficient documentation

## 2018-12-09 DIAGNOSIS — R1084 Generalized abdominal pain: Secondary | ICD-10-CM | POA: Insufficient documentation

## 2018-12-09 LAB — URINALYSIS, ROUTINE W REFLEX MICROSCOPIC
Bilirubin Urine: NEGATIVE
GLUCOSE, UA: NEGATIVE mg/dL
Hgb urine dipstick: NEGATIVE
Ketones, ur: NEGATIVE mg/dL
Leukocytes,Ua: NEGATIVE
Nitrite: NEGATIVE
Protein, ur: NEGATIVE mg/dL
Specific Gravity, Urine: 1.025 (ref 1.005–1.030)
pH: 6 (ref 5.0–8.0)

## 2018-12-09 LAB — COMPREHENSIVE METABOLIC PANEL
ALT: 17 U/L (ref 0–44)
AST: 15 U/L (ref 15–41)
Albumin: 4.2 g/dL (ref 3.5–5.0)
Alkaline Phosphatase: 80 U/L (ref 38–126)
Anion gap: 8 (ref 5–15)
BUN: 14 mg/dL (ref 6–20)
CO2: 23 mmol/L (ref 22–32)
Calcium: 9.2 mg/dL (ref 8.9–10.3)
Chloride: 101 mmol/L (ref 98–111)
Creatinine, Ser: 0.9 mg/dL (ref 0.61–1.24)
GFR calc Af Amer: >60 mL/min (ref >60)
GFR calc non Af Amer: >60 mL/min (ref >60)
Glucose, Bld: 113 mg/dL — ABNORMAL HIGH (ref 70–99)
Potassium: 3.9 mmol/L (ref 3.5–5.1)
Sodium: 132 mmol/L — ABNORMAL LOW (ref 135–145)
Total Bilirubin: 0.3 mg/dL (ref 0.3–1.2)
Total Protein: 7.5 g/dL (ref 6.5–8.1)

## 2018-12-09 LAB — CBC WITH DIFFERENTIAL/PLATELET
Abs Immature Granulocytes: 0.04 10*3/uL (ref 0.00–0.07)
Basophils Absolute: 0.1 10*3/uL (ref 0.0–0.1)
Basophils Relative: 1 %
Eosinophils Absolute: 0.4 10*3/uL (ref 0.0–0.5)
Eosinophils Relative: 4 %
HEMATOCRIT: 48.7 % (ref 39.0–52.0)
Hemoglobin: 16.4 g/dL (ref 13.0–17.0)
Immature Granulocytes: 0 %
Lymphocytes Relative: 43 %
Lymphs Abs: 4.1 10*3/uL — ABNORMAL HIGH (ref 0.7–4.0)
MCH: 30.7 pg (ref 26.0–34.0)
MCHC: 33.7 g/dL (ref 30.0–36.0)
MCV: 91 fL (ref 80.0–100.0)
MONOS PCT: 8 %
Monocytes Absolute: 0.7 10*3/uL (ref 0.1–1.0)
NEUTROS ABS: 4.2 10*3/uL (ref 1.7–7.7)
Neutrophils Relative %: 44 %
Platelets: 306 10*3/uL (ref 150–400)
RBC: 5.35 MIL/uL (ref 4.22–5.81)
RDW: 13.8 % (ref 11.5–15.5)
WBC: 9.5 10*3/uL (ref 4.0–10.5)
nRBC: 0 % (ref 0.0–0.2)

## 2018-12-09 LAB — LIPASE, BLOOD: Lipase: 65 U/L — ABNORMAL HIGH (ref 11–51)

## 2018-12-09 MED ORDER — ALUM & MAG HYDROXIDE-SIMETH 200-200-20 MG/5ML PO SUSP
30.0000 mL | Freq: Once | ORAL | Status: AC
  Administered 2018-12-09: 30 mL via ORAL
  Filled 2018-12-09: qty 30

## 2018-12-09 MED ORDER — KETOROLAC TROMETHAMINE 30 MG/ML IJ SOLN
30.0000 mg | Freq: Once | INTRAMUSCULAR | Status: AC
  Administered 2018-12-09: 30 mg via INTRAVENOUS
  Filled 2018-12-09: qty 1

## 2018-12-09 MED ORDER — IOHEXOL 300 MG/ML  SOLN
100.0000 mL | Freq: Once | INTRAMUSCULAR | Status: AC | PRN
  Administered 2018-12-09: 100 mL via INTRAVENOUS

## 2018-12-09 NOTE — Discharge Instructions (Addendum)
You have been seen today for abdominal pain. Please read and follow all provided instructions.   1. Medications: usual home medications 2. Treatment: rest, drink plenty of fluids 3. Follow Up: Please follow up with Gastroenterologist. Please follow up with your primary doctor in 2 days for discussion of your diagnoses and further evaluation after today's visit; if you do not have a primary care doctor use the resource guide provided to find one; Please return to the ER for any new or worsening symptoms. Please obtain all of your results from medical records or have your doctors office obtain the results - share them with your doctor - you should be seen at your doctors office. Call today to arrange your follow up.   Take medications as prescribed. Please review all of the medicines and only take them if you do not have an allergy to them. Return to the emergency room for worsening condition or new concerning symptoms. Follow up with your regular doctor. If you don't have a regular doctor use one of the numbers below to establish a primary care doctor.  Please be aware that if you are taking birth control pills, taking other prescriptions, ESPECIALLY ANTIBIOTICS may make the birth control ineffective - if this is the case, either do not engage in sexual activity or use alternative methods of birth control such as condoms until you have finished the medicine and your family doctor says it is OK to restart them. If you are on a blood thinner such as COUMADIN, be aware that any other medicine that you take may cause the coumadin to either work too much, or not enough - you should have your coumadin level rechecked in next 7 days if this is the case.  ?  It is also a possibility that you have an allergic reaction to any of the medicines that you have been prescribed - Everybody reacts differently to medications and while MOST people have no trouble with most medicines, you may have a reaction such as nausea,  vomiting, rash, swelling, shortness of breath. If this is the case, please stop taking the medicine immediately and contact your physician.  ?  You should return to the ER if you develop severe or worsening symptoms.   Emergency Department Resource Guide 1) Find a Doctor and Pay Out of Pocket Although you won't have to find out who is covered by your insurance plan, it is a good idea to ask around and get recommendations. You will then need to call the office and see if the doctor you have chosen will accept you as a new patient and what types of options they offer for patients who are self-pay. Some doctors offer discounts or will set up payment plans for their patients who do not have insurance, but you will need to ask so you aren't surprised when you get to your appointment.  2) Contact Your Local Health Department Not all health departments have doctors that can see patients for sick visits, but many do, so it is worth a call to see if yours does. If you don't know where your local health department is, you can check in your phone book. The CDC also has a tool to help you locate your state's health department, and many state websites also have listings of all of their local health departments.  3) Find a Walk-in Clinic If your illness is not likely to be very severe or complicated, you may want to try a walk in clinic. These  are popping up all over the country in pharmacies, drugstores, and shopping centers. They're usually staffed by nurse practitioners or physician assistants that have been trained to treat common illnesses and complaints. They're usually fairly quick and inexpensive. However, if you have serious medical issues or chronic medical problems, these are probably not your best option.  No Primary Care Doctor: Call Health Connect at  [phone] - they can help you locate a primary care doctor that  accepts your insurance, provides certain services, etc. Physician Referral [phone]  Emergency Department Resource Guide 1) Find a Doctor and Pay Out of Pocket Although you won't have to find out who is covered by your insurance plan, it is a good idea to ask around and get recommendations. You will then need to call the office and see if the doctor you have chosen will accept you as a new patient and what types of options they offer for patients who are self-pay. Some doctors offer discounts or will set up payment plans for their patients who do not have insurance, but you will need to ask so you aren't surprised when you get to your appointment.  2) Contact Your Local Health Department Not all health departments have doctors that can see patients for sick visits, but many do, so it is worth a call to see if yours does. If you don't know where your local health department is, you can check in your phone book. The CDC also has a tool to help you locate your state's health department, and many state websites also have listings of all of their local health departments.  3) Find a [facility] If your illness is not likely to be very severe or complicated, you may want to try a walk in clinic. These are popping up all over the country in pharmacies, drugstores, and shopping centers. They're usually staffed by nurse practitioners or physician assistants that have been trained to treat common illnesses and complaints. They're usually fairly quick and inexpensive. However, if you have serious medical issues or chronic medical problems, these are probably not your best option.  No Primary Care Doctor: Call Health Connect at  [phone] - they can help you locate a primary care doctor that  accepts your insurance, provides certain services, etc. Physician Referral Service- [phone]  Chronic Pain Problems: Organization         Address  Phone   Notes  [facility]  [phone] Patients need to be referred by their primary care doctor.    Medication Assistance: Organization         Address  Phone   Notes  [facility] Medication [facility] [address] [place], [place] [address] [phone] --Must be a resident of [facility] -- Must have NO insurance coverage whatsoever (no Medicaid/ Medicare, etc.) -- The pt. MUST have a primary care doctor that directs their care regularly and follows them in the community   [facility]  [phone]   [employer]  [phone]    Agencies that provide inexpensive medical care: Organization         Address  Phone   Notes  [place]  [phone]   [facility]    [phone]   [facility] [place],  [phone]   [place] [address] [place] [phone]   [facility]    (  [phone]   [facility]    [phone]   [facility]  [address], [place] [place] Phone:  [phone], Fax:  [phone] Hours of Operation:  9 am - 6 pm, M-F.  Also accepts Medicaid/Medicare and self-pay.  [facility] for [place] [address], [address], [place] Phone: [phone], Fax: [phone]. Hours of Operation:  8:30 am - 5:30 pm, M-F.  Also accepts Medicaid and self-pay.  [facility] [address], [place] Phone: [phone]   [place], [place], [place] [phone], Ext. [phone] Mondays & Thursdays: 7-9 AM.  First 15 patients are seen on a first come, first serve basis.    [place] Providers:  Organization         Address  Phone   Notes  [facility] [address], [place] [phone] Also accepts self-pay patients.  [facility] [address], [place]  [phone]   [place], [address], [place] [phone]   [facility] [address] [place] [phone]   Lucianne Lei [address], [place]   [phone] Only accepts [place] [facility] [place] patients after they have their name applied to their card.   Self-Pay (no insurance) in [facility]:  Organization         Address  Phone   Notes  Sickle Cell Patients, [facility] [place] [phone]   [facility] [place] [phone]   [facility] [place]  [place], [place], [place] [phone]   [facility]/Dr. Osei-Bonsu  [address], [place] or [address], [place] [phone] Phone number for both [place] and [place] locations is the same.  [facility] [address], [place] [phone]   [facility] [address] [place] or [address] [phone]   [facility] [address] [place] [phone], phone; [phone], fax Sees patients 1st and 3rd Saturday of every month.  Must not qualify for public or private insurance (i.e. Medicaid, Medicare, [place] [employer] Choice, Veterans' Benefits)  Household income should be no more than 200% of the poverty level The clinic cannot treat you if you are pregnant or think you are pregnant  Sexually transmitted diseases are not treated at the clinic.

## 2018-12-09 NOTE — ED Provider Notes (Signed)
[facility] EMERGENCY DEPARTMENT Provider Note   CSN: [id number] Arrival date & time: 12/09/18  1418    History   Chief Complaint Chief Complaint  Patient presents with  . Abdominal Pain    HPI Aaron Weber is a 48 y.o. male with a PMH of HTN, Diabetes, and GERD presenting with constant generalized abdominal pain radiating to right thoracic back onset 3 days ago. Patient describes pain as bloating and burning. Patient reports he was evaluated on 11/22/2018 for similar symptoms. Patient states he was prescribed Bentyl and referred to GI. Patient states he has been taking Bentyl, tylenol, and ibuprofen with temporary relief. Patient denies nausea, vomiting, diarrhea, or constipation. Patient states last BM was yesterday and it was normal. Patient states he is able to eat and drink without difficulty. Patient denies fever, chills, dysuria, hematuria, or urinary frequency. Patient reports tobacco use and occasional alcohol use, but denies drug use. Patient states he did not follow up with GI and states he last saw GI in 2002. Patient denies chest pain or shortness of breath. Patient denies sick contacts. Patient denies cough, congestion, rhinorrhea, or sore throat. Denies numbness, tingling, weakness, incontinence to bowel/bladder, fever, chills, IV drug use, or hx of cancer. Patient denies any abdominal surgeries.     HPI  Past Medical History:  Diagnosis Date  . Diabetes mellitus without complication (HCC)   . GERD (gastroesophageal reflux disease)   . Hypertension   . Pleurisy     There are no active problems to display for this patient.   Past Surgical History:  Procedure Laterality Date  . TONSILLECTOMY          Home Medications    Prior to Admission medications   Medication Sig Start Date End Date Taking? Authorizing Provider  amLODipine (NORVASC) 5 MG tablet Take 5 mg by mouth daily.    [provider]  bimatoprost (LUMIGAN) 0.03 % ophthalmic  solution 1 drop at bedtime.    [provider]  dicyclomine (BENTYL) 20 MG tablet Take 1 tablet (20 mg total) by mouth 2 (two) times daily. 11/23/18   Zadie Rhine, MD  fluticasone (FLONASE) 50 MCG/ACT nasal spray Place 2 sprays into both nostrils daily. 03/17/15   Palumbo, April, MD  loratadine (CLARITIN) 10 MG tablet Take 1 tablet (10 mg total) by mouth daily. 03/05/12 09/10/18  Palumbo, April, MD  loratadine (CLARITIN) 10 MG tablet Take 1 tablet (10 mg total) by mouth daily. 03/26/18   Palumbo, April, MD  metFORMIN (GLUCOPHAGE) 500 MG tablet Take 1 tablet (500 mg total) by mouth 2 (two) times daily with a meal. 02/04/17   Palumbo, April, MD  OMEPRAZOLE PO Take by mouth.    [provider]    Family History History reviewed. No pertinent family history.  Social History Social History   Tobacco Use  . Smoking status: Current Some Day Smoker    Packs/day: 0.50    Types: Cigars  . Smokeless tobacco: Former Engineer, water Use Topics  . Alcohol use: Yes    Comment: rare  . Drug use: No     Allergies   Patient has no known allergies.   Review of Systems Review of Systems  Constitutional: Negative for activity change, appetite change, chills, fever and unexpected weight change.  HENT: Negative for congestion, rhinorrhea and sore throat.   Eyes: Negative for visual disturbance.  Respiratory: Negative for cough and shortness of breath.   Cardiovascular: Negative for chest pain.  Gastrointestinal: Positive  for abdominal distention and abdominal pain. Negative for constipation, diarrhea, nausea and vomiting.  Endocrine: Negative for polydipsia, polyphagia and polyuria.  Genitourinary: Negative for dysuria, flank pain, frequency and hematuria.  Musculoskeletal: Positive for back pain. Negative for gait problem, myalgias and neck pain.  Skin: Negative for rash.  Neurological: Negative for dizziness, weakness and light-headedness.  Psychiatric/Behavioral: The patient  is not nervous/anxious.    Physical Exam Updated Vital Signs BP 108/74 (BP Location: Left Arm)   Pulse 75   Temp 98 F (36.7 C) (Oral)   Resp 16   Ht 5' 11.5" (1.816 m)   Wt 99.8 kg   SpO2 99%   BMI 30.26 kg/m   Physical Exam Vitals signs and nursing note reviewed.  Constitutional:      General: He is not in acute distress.    Appearance: He is well-developed. He is not diaphoretic.  HENT:     Head: Normocephalic and atraumatic.  Neck:     Musculoskeletal: Normal range of motion and neck supple.  Cardiovascular:     Rate and Rhythm: Normal rate and regular rhythm.     Heart sounds: Normal heart sounds. No murmur. No friction rub. No gallop.   Pulmonary:     Effort: Pulmonary effort is normal. No respiratory distress.     Breath sounds: Normal breath sounds. No wheezing or rales.  Abdominal:     General: Bowel sounds are normal. There is no distension.     Palpations: Abdomen is soft. Abdomen is not rigid. There is no mass.     Tenderness: There is generalized abdominal tenderness (Pain is worse in epigastric and RLQ.) and tenderness in the right lower quadrant and epigastric area. There is guarding. There is no right CVA tenderness, left CVA tenderness or rebound. Negative signs include Murphy's sign and McBurney's sign.     Hernia: No hernia is present.  Musculoskeletal: Normal range of motion.     Cervical back: Normal. He exhibits normal range of motion, no tenderness and no bony tenderness.     Thoracic back: He exhibits tenderness. He exhibits normal range of motion and no bony tenderness.     Lumbar back: Normal. He exhibits normal range of motion, no tenderness and no bony tenderness.     Comments: Mild right sided thoracic tenderness upon palpation. No skin changes noted. Full ROM without difficulty. 2+ DP pulses. Sensation intact. 5/5 strength in lower extremities. Patient ambulates without difficulty. Negative straight leg test.   Skin:    Findings: No rash.    Neurological:     Mental Status: He is alert and oriented to person, place, and time.    ED Treatments / Results  Labs (all labs ordered are listed, but only abnormal results are displayed) Labs Reviewed  CBC WITH DIFFERENTIAL/PLATELET - Abnormal; Notable for the following components:      Result Value   Lymphs Abs 4.1 (*)    All other components within normal limits  COMPREHENSIVE METABOLIC PANEL - Abnormal; Notable for the following components:   Sodium 132 (*)    Glucose, Bld 113 (*)    All other components within normal limits  LIPASE, BLOOD - Abnormal; Notable for the following components:   Lipase 65 (*)    All other components within normal limits  URINALYSIS, ROUTINE W REFLEX MICROSCOPIC    EKG None  Radiology Ct Abdomen Pelvis W Contrast  Result Date: 12/09/2018 CLINICAL DATA:  Generalized abdominal pain, right greater than left. EXAM: CT ABDOMEN AND  PELVIS WITH CONTRAST TECHNIQUE: Multidetector CT imaging of the abdomen and pelvis was performed using the standard protocol following bolus administration of intravenous contrast. CONTRAST:  OMNIPAQUE IOHEXOL 300 MG/ML  SOLN COMPARISON:  11/23/2018 FINDINGS: Lower chest: Unremarkable Hepatobiliary: No suspicious focal abnormality within the liver parenchyma. There is no evidence for gallstones, gallbladder wall thickening, or pericholecystic fluid. No intrahepatic or extrahepatic biliary dilation. Pancreas: No focal mass lesion. No dilatation of the main duct. No intraparenchymal cyst. No peripancreatic edema. Spleen: No splenomegaly. No focal mass lesion. Adrenals/Urinary Tract: No adrenal nodule or mass. Stable 16 mm right renal cyst. Tiny low-density lesions in the cortex of each kidney are too small to characterize but most likely benign. No evidence for hydroureter. The The urinary bladder appears normal for the degree of distention. Stomach/Bowel: Stomach is unremarkable. No gastric wall thickening. No evidence of  outlet obstruction. Duodenum is normally positioned as is the ligament of Treitz. No small bowel wall thickening. No small bowel dilatation. The terminal ileum is normal. The appendix is normal. No gross colonic mass. No colonic wall thickening. Vascular/Lymphatic: There is abdominal aortic atherosclerosis without aneurysm. there is no gastrohepatic or hepatoduodenal ligament lymphadenopathy. No intraperitoneal or retroperitoneal lymphadenopathy. No pelvic sidewall lymphadenopathy. Reproductive: The prostate gland and seminal vesicles are unremarkable. Other: No intraperitoneal free fluid. Musculoskeletal: No worrisome lytic or sclerotic osseous abnormality. IMPRESSION: No acute findings in the abdomen or pelvis. Specifically, no findings to explain the patient's history of generalized abdominal pain. Electronically Signed   By: Kennith Center M.D.   On: 12/09/2018 18:07    Procedures Procedures (including critical care time)  Medications Ordered in ED Medications  alum & mag hydroxide-simeth (MAALOX/MYLANTA) 200-200-20 MG/5ML suspension 30 mL (has no administration in time range)  ketorolac (TORADOL) 30 MG/ML injection 30 mg (30 mg Intravenous Given 12/09/18 1506)  iohexol (OMNIPAQUE) 300 MG/ML solution 100 mL (100 mLs Intravenous Contrast Given 12/09/18 1725)   Initial Impression / Assessment and Plan / ED Course  I have reviewed the triage vital signs and the nursing notes.  Pertinent labs & imaging results that were available during my care of the patient were reviewed by me and considered in my medical decision making (see chart for details).  Clinical Course as of Dec 09 1824  Thu Dec 09, 2018  1524 WBCs are within normal limits.  WBC: 9.5 [AH]  1524 Patient states Toradol has improved his symptoms.    [AH]  1609 Patient continues to have RLQ tenderness on palpation. Will order abdominal CT scan.    [AH]  1811 No acute findings in the abdomen or pelvis noted on CT abdomen.  CT Abdomen  Pelvis W Contrast [AH]    Clinical Course User Index [AH] Leretha Dykes, PA-C      Patient is nontoxic, nonseptic appearing, in no apparent distress.  Patient's pain and other symptoms adequately managed in emergency department.  Provided Toradol and GI cocktail with improvement. Fluids encouraged while in the ER.  Labs, imaging and vitals reviewed.  Patient does not meet the SIRS or Sepsis criteria.  On repeat exam patient does not have a surgical abdomin and there are no peritoneal signs. CT abdomen is negative for acute findings. No indication of appendicitis, bowel obstruction, bowel perforation, cholecystitis, or diverticulitis. Patient discharged home and given strict instructions for follow-up with their primary care physician and GI. Advised patient to continue omeprazole and bentyl as prescribed. I have also discussed reasons to return immediately to the ER.  Patient expresses understanding and agrees with plan.  Final Clinical Impressions(s) / ED Diagnoses   Final diagnoses:  Generalized abdominal pain    ED Discharge Orders    None       Leretha Dykes, [place] 12/09/18 1847    Marily Memos, MD 12/10/18 0010

## 2018-12-09 NOTE — ED Notes (Signed)
ED Provider at bedside. 

## 2018-12-09 NOTE — ED Triage Notes (Signed)
pt c/o generalized abd pain cont , seen here 2/11 for same w/o improvement

## 2019-03-19 ENCOUNTER — Emergency Department (HOSPITAL_BASED_OUTPATIENT_CLINIC_OR_DEPARTMENT_OTHER)
Admission: EM | Admit: 2019-03-19 | Discharge: 2019-03-19 | Disposition: A | Discharge status: Home / Self Care | Payer: Self-pay | Attending: Emergency Medicine | Admitting: Emergency Medicine

## 2019-03-19 ENCOUNTER — Encounter (HOSPITAL_BASED_OUTPATIENT_CLINIC_OR_DEPARTMENT_OTHER): Payer: Self-pay | Admitting: *Deleted

## 2019-03-19 ENCOUNTER — Other Ambulatory Visit: Payer: Self-pay

## 2019-03-19 DIAGNOSIS — I10 Essential (primary) hypertension: Secondary | ICD-10-CM | POA: Insufficient documentation

## 2019-03-19 DIAGNOSIS — F17290 Nicotine dependence, other tobacco product, uncomplicated: Secondary | ICD-10-CM | POA: Insufficient documentation

## 2019-03-19 DIAGNOSIS — Z7984 Long term (current) use of oral hypoglycemic drugs: Secondary | ICD-10-CM | POA: Insufficient documentation

## 2019-03-19 DIAGNOSIS — R310 Gross hematuria: Secondary | ICD-10-CM | POA: Insufficient documentation

## 2019-03-19 DIAGNOSIS — Z79899 Other long term (current) drug therapy: Secondary | ICD-10-CM | POA: Insufficient documentation

## 2019-03-19 DIAGNOSIS — E119 Type 2 diabetes mellitus without complications: Secondary | ICD-10-CM | POA: Insufficient documentation

## 2019-03-19 NOTE — ED Triage Notes (Signed)
Pt states he was administering a penile injection and hit the wrong spot and now blood is coming from the urethra and he is having swelling

## 2019-03-19 NOTE — ED Notes (Signed)
ED Provider at bedside. 

## 2019-03-19 NOTE — ED Provider Notes (Signed)
[facility] EMERGENCY DEPARTMENT Provider Note   CSN: [id number] Arrival date & time: 03/19/19  1823    History   Chief Complaint Chief Complaint  Patient presents with  . penis bleeding    HPI PHONG ISENBERG is a 48 y.o. male.     HPI Patient states that roughly 1 hour ago he self injected  medication into the right lateral side of his penis.  States he has had this done at the doctor's office in the past but never done it himself.  Thinks he may have depressed too hard and injected into his urethra.  States he almost immediately saw small amount of blood in the medication come out the distal end of his urethra.  Has mild irritation to the end of the urethra.  Did not have erection.  States he was symptom-free prior to injecting the medication.  Has been able to urinate since. Past Medical History:  Diagnosis Date  . Diabetes mellitus without complication ([place])   . GERD (gastroesophageal reflux disease)   . Hypertension   . Pleurisy     There are no active problems to display for this patient.   Past Surgical History:  Procedure Laterality Date  . TONSILLECTOMY          Home Medications    Prior to Admission medications   Medication Sig Start Date End Date Taking? Authorizing Provider  amLODipine (NORVASC) 5 MG tablet Take 5 mg by mouth daily.    [provider]  bimatoprost (LUMIGAN) 0.03 % ophthalmic solution 1 drop at bedtime.    [provider]  dicyclomine (BENTYL) 20 MG tablet Take 1 tablet (20 mg total) by mouth 2 (two) times daily. 11/23/18   Ripley Fraise, MD  fluticasone (FLONASE) 50 MCG/ACT nasal spray Place 2 sprays into both nostrils daily. 03/17/15   Palumbo, April, MD  loratadine (CLARITIN) 10 MG tablet Take 1 tablet (10 mg total) by mouth daily. 03/05/12 09/10/18  Palumbo, April, MD  loratadine (CLARITIN) 10 MG tablet Take 1 tablet (10 mg total) by mouth daily. 03/26/18   Palumbo, April, MD  metFORMIN (GLUCOPHAGE) 500 MG  tablet Take 1 tablet (500 mg total) by mouth 2 (two) times daily with a meal. 02/04/17   Palumbo, April, MD  OMEPRAZOLE PO Take by mouth.    [provider]    Family History No family history on file.  Social History Social History   Tobacco Use  . Smoking status: Current Some Day Smoker    Packs/day: 0.50    Types: Cigars  . Smokeless tobacco: Former Network engineer Use Topics  . Alcohol use: Yes    Comment: rare  . Drug use: No     Allergies   Tomato   Review of Systems Review of Systems  Genitourinary: Positive for dysuria and hematuria. Negative for penile pain and penile swelling.  Skin: Negative for rash and wound.  All other systems reviewed and are negative.    Physical Exam Updated Vital Signs BP 114/75 (BP Location: Right Arm)   Pulse (!) 123   Temp 98.9 F (37.2 C) (Oral)   Resp 18   Ht 5\' 11"  (1.803 m)   Wt 93.9 kg   SpO2 96%   BMI 28.87 kg/m   Physical Exam Vitals signs and nursing note reviewed.  Constitutional:      General: He is not in acute distress.    Appearance: He is well-developed.  HENT:     Head: Normocephalic  and atraumatic.  Eyes:     Pupils: Pupils are equal, round, and reactive to light.  Neck:     Musculoskeletal: Normal range of motion and neck supple.  Pulmonary:     Effort: Pulmonary effort is normal.  Genitourinary:    Comments: Flaccid penis.  No penile discharge.  No hematuria.  No tenderness to palpation. Musculoskeletal: Normal range of motion.  Skin:    General: Skin is warm and dry.     Findings: No erythema or rash.  Neurological:     Mental Status: He is alert and oriented to person, place, and time.  Psychiatric:        Behavior: Behavior normal.      ED Treatments / Results  Labs (all labs ordered are listed, but only abnormal results are displayed) Labs Reviewed - No data to display  EKG None  Radiology No results found.  Procedures Procedures (including critical care time)   Medications Ordered in ED Medications - No data to display   Initial Impression / Assessment and Plan / ED Course  I have reviewed the triage vital signs and the nursing notes.  Pertinent labs & imaging results that were available during my care of the patient were reviewed by me and considered in my medical decision making (see chart for details).        Patient possibly injected prostaglandin E2 directly into the urethra.  Does not appear to have any significant complications from this.  Had mild hematuria which has cleared.  Advised to drink plenty of fluids and return precautions given.  Final Clinical Impressions(s) / ED Diagnoses   Final diagnoses:  Gross hematuria    ED Discharge Orders    None       Loren RacerYelverton, [place], MD 03/19/19 1851

## 2019-04-22 IMAGING — CT CT ABD-PELV W/ CM
2 of 5 series · 16 of 46 positions shown, 18 images · IV contrast (APPLIED)
Comparison: CT the abdomen and pelvis 07/24/2015.

CLINICAL DATA: 47-year-old male with history of abdominal bloating
and pain for 1 week.

EXAM:
CT ABDOMEN AND PELVIS WITH CONTRAST
TECHNIQUE: Multidetector CT imaging of the abdomen and pelvis was performed
using the standard protocol following bolus administration of
intravenous contrast.
CONTRAST:  100mL [id number] IOPAMIDOL ([id number]) INJECTION 61%

[Series 2: axial st · axial · 0.87mm/px · z∈[-464,-29]mm · 13 of 99 slices shown, 15 images]
[im 6/99  soft-tissue]
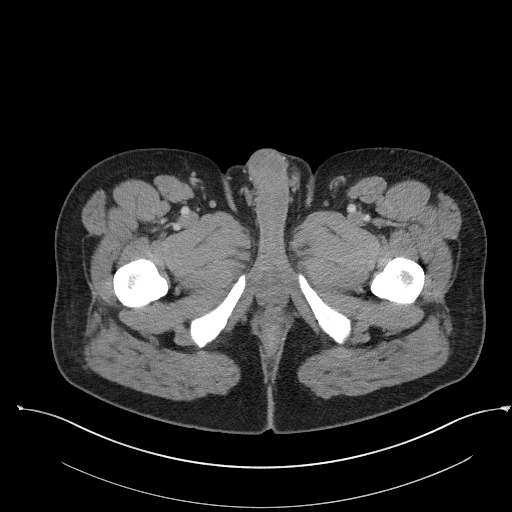
[im 6/99  bone]
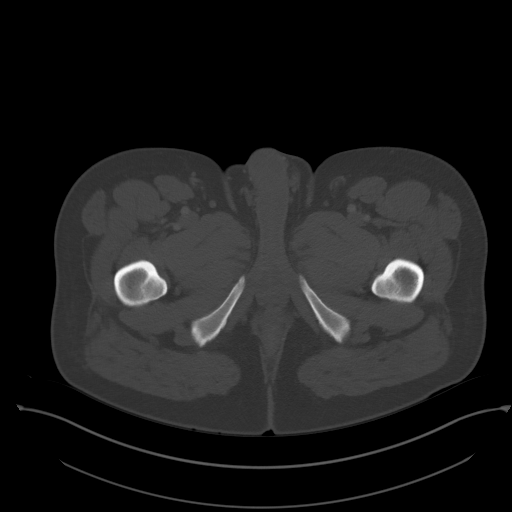
[im 11/99  soft-tissue]
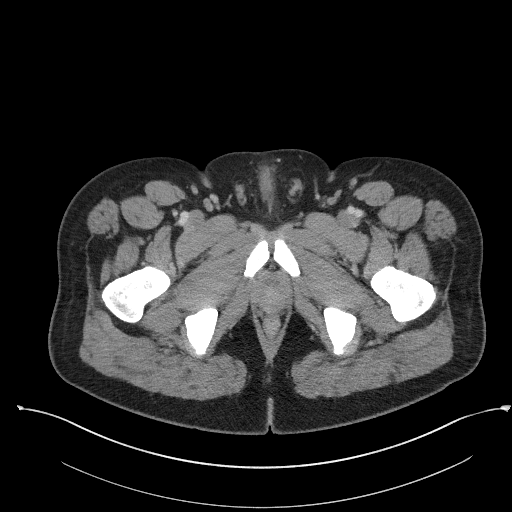
[im 22/99  soft-tissue]
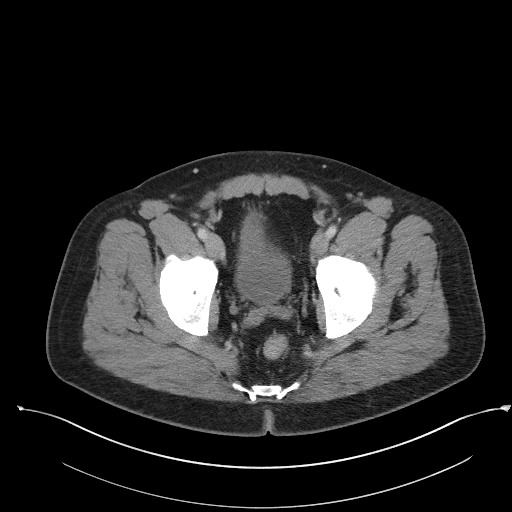
[im 28/99  soft-tissue]
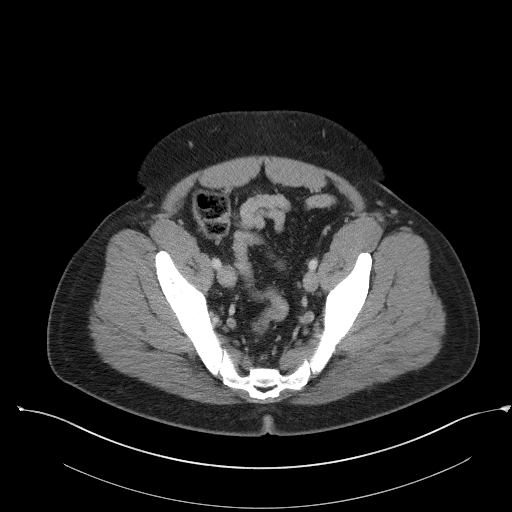
[im 33/99  soft-tissue]
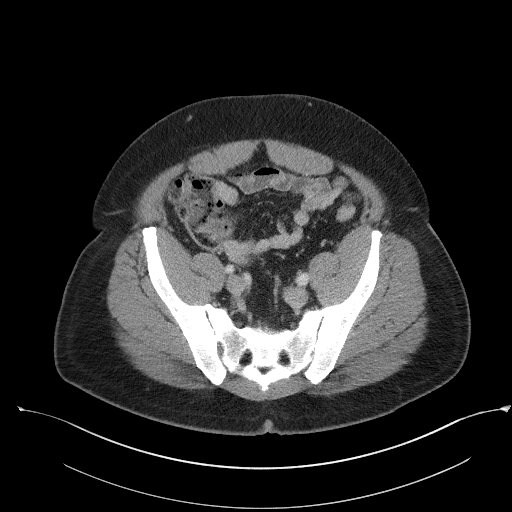
[im 44/99  soft-tissue]
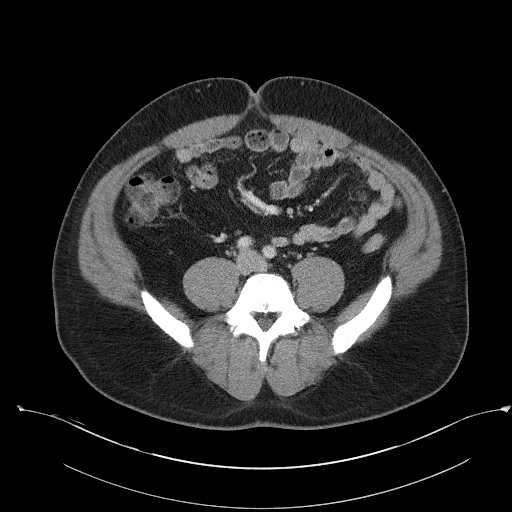
[im 50/99  soft-tissue]
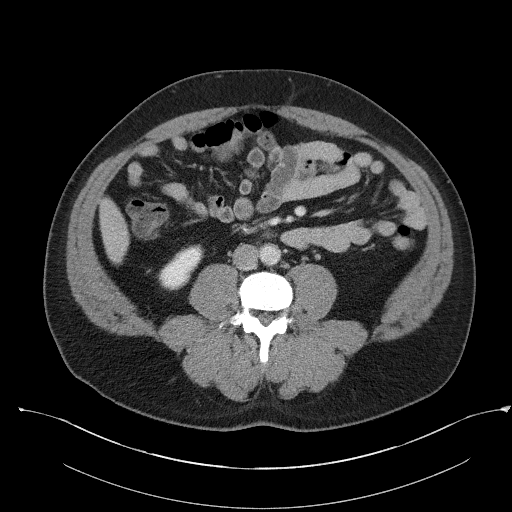
[im 55/99  soft-tissue]
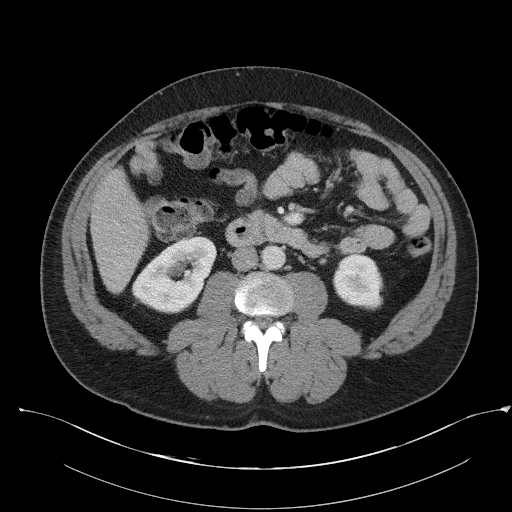
[im 66/99  soft-tissue]
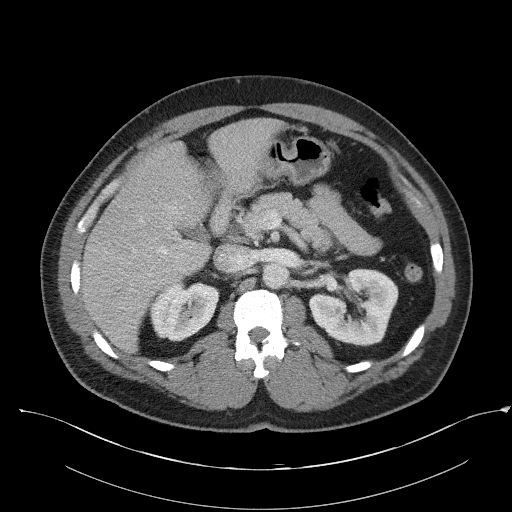
[im 66/99  bone]
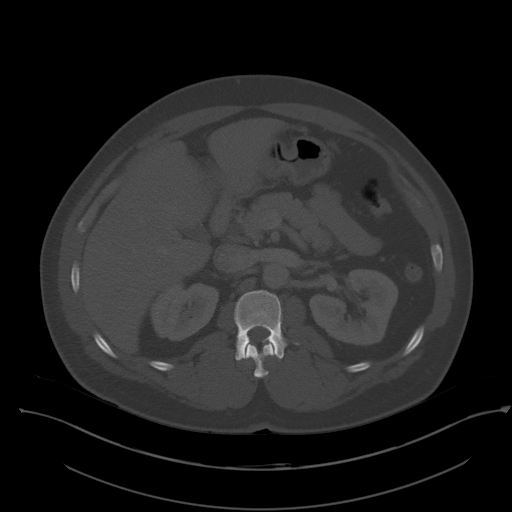
[im 71/99  soft-tissue]
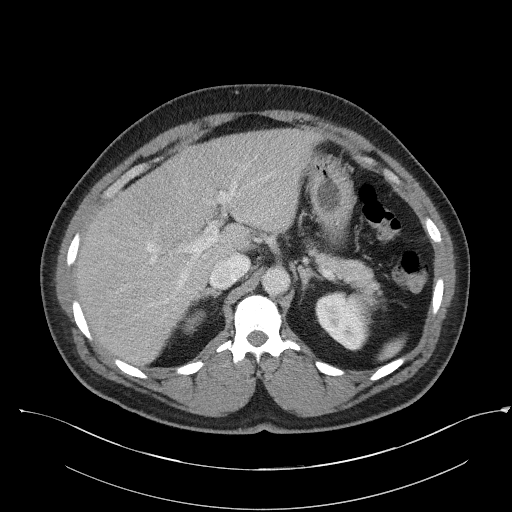
[im 77/99  soft-tissue]
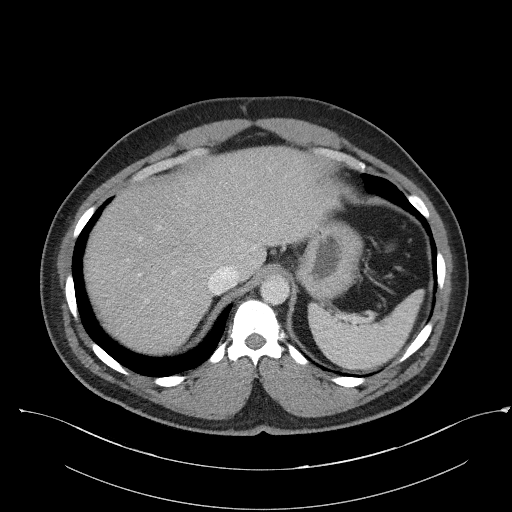
[im 88/99  soft-tissue]
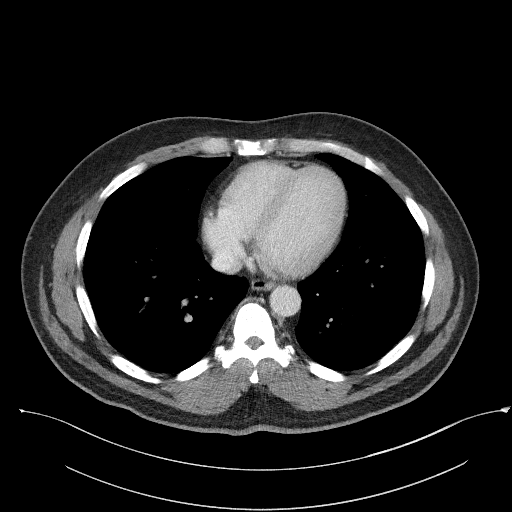
[im 93/99  soft-tissue]
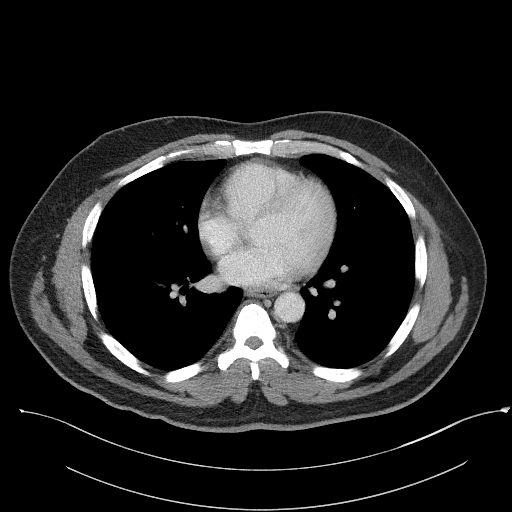

[Series 5: coronal st · coronal · 0.76mm/px · 3 of 92 slices shown]
[im 31/92  soft-tissue]
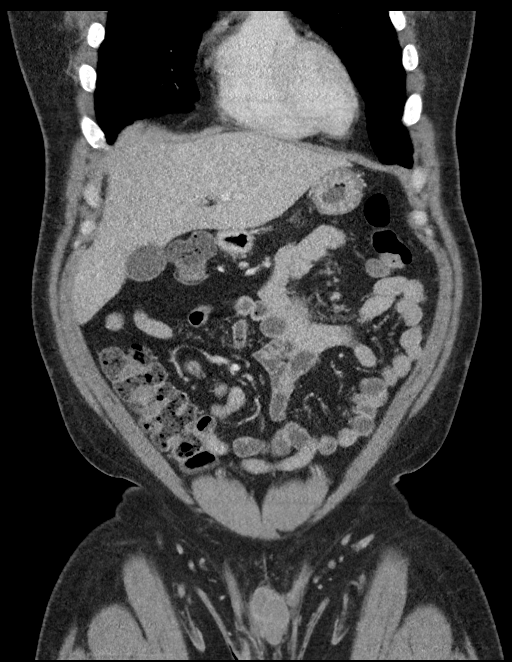
[im 41/92  soft-tissue]
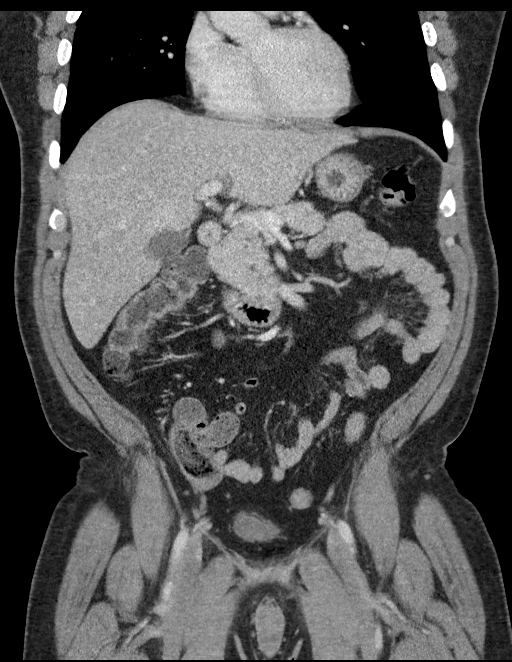
[im 51/92  soft-tissue]
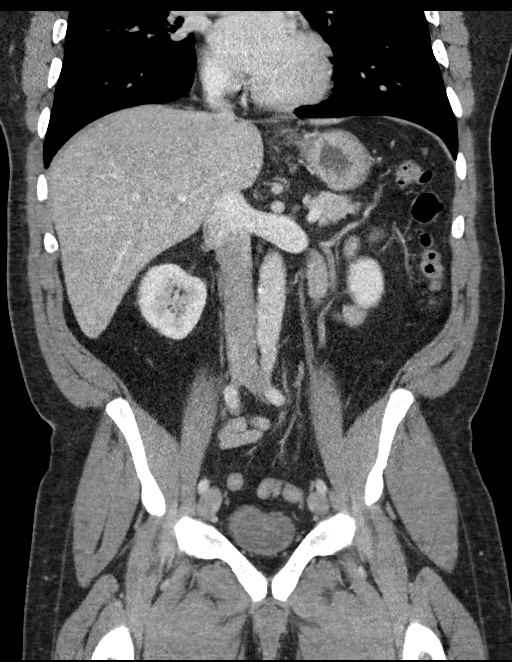

[16 of 46 positions shown; findings below may reference images not displayed]

FINDINGS: Lower chest: Unremarkable.

Hepatobiliary: No suspicious cystic or solid hepatic lesions. No
intra or extrahepatic biliary ductal dilatation. Gallbladder is
normal in appearance.

Pancreas: No pancreatic mass. No pancreatic ductal dilatation. No
pancreatic or peripancreatic fluid or inflammatory changes.

Spleen: Unremarkable.

Adrenals/Urinary Tract: 1.6 cm simple cyst in the interpolar region
of the right kidney. Subcentimeter low-attenuation lesion in the
interpolar region of the left kidney, too small to characterize, but
statistically likely to represent a tiny cyst. No suspicious renal
lesions. No hydroureteronephrosis. Urinary bladder is normal in
appearance. Bilateral adrenal glands are normal in appearance.

Stomach/Bowel: Normal appearance of the stomach. No pathologic
dilatation of small bowel or colon. Normal appendix.

Vascular/Lymphatic: Aortic atherosclerosis, without evidence of
aneurysm or dissection in the abdominal or pelvic vasculature. No
lymphadenopathy noted in the abdomen or pelvis.

Reproductive: Prostate gland and seminal vesicles are unremarkable
in appearance.

Other: No significant volume of ascites.  No pneumoperitoneum.

Musculoskeletal: There are no aggressive appearing lytic or blastic
lesions noted in the visualized portions of the skeleton.
IMPRESSION: 1. No acute findings are noted in the abdomen or pelvis to account
for the patient's symptoms.
2. Aortic atherosclerosis.

## 2019-05-20 ENCOUNTER — Other Ambulatory Visit: Payer: Self-pay

## 2019-05-20 ENCOUNTER — Emergency Department (HOSPITAL_BASED_OUTPATIENT_CLINIC_OR_DEPARTMENT_OTHER): Payer: Self-pay

## 2019-05-20 ENCOUNTER — Emergency Department (HOSPITAL_BASED_OUTPATIENT_CLINIC_OR_DEPARTMENT_OTHER)
Admission: EM | Admit: 2019-05-20 | Discharge: 2019-05-20 | Disposition: A | Discharge status: Home / Self Care | Payer: Self-pay | Attending: Emergency Medicine | Admitting: Emergency Medicine

## 2019-05-20 ENCOUNTER — Encounter (HOSPITAL_BASED_OUTPATIENT_CLINIC_OR_DEPARTMENT_OTHER): Payer: Self-pay | Admitting: *Deleted

## 2019-05-20 DIAGNOSIS — Z79899 Other long term (current) drug therapy: Secondary | ICD-10-CM | POA: Insufficient documentation

## 2019-05-20 DIAGNOSIS — E119 Type 2 diabetes mellitus without complications: Secondary | ICD-10-CM | POA: Insufficient documentation

## 2019-05-20 DIAGNOSIS — F17290 Nicotine dependence, other tobacco product, uncomplicated: Secondary | ICD-10-CM | POA: Insufficient documentation

## 2019-05-20 DIAGNOSIS — R109 Unspecified abdominal pain: Secondary | ICD-10-CM

## 2019-05-20 DIAGNOSIS — Z7984 Long term (current) use of oral hypoglycemic drugs: Secondary | ICD-10-CM | POA: Insufficient documentation

## 2019-05-20 DIAGNOSIS — R1084 Generalized abdominal pain: Secondary | ICD-10-CM | POA: Insufficient documentation

## 2019-05-20 DIAGNOSIS — I10 Essential (primary) hypertension: Secondary | ICD-10-CM | POA: Insufficient documentation

## 2019-05-20 LAB — COMPREHENSIVE METABOLIC PANEL
ALT: 15 U/L (ref 0–44)
AST: 15 U/L (ref 15–41)
Albumin: 4.1 g/dL (ref 3.5–5.0)
Alkaline Phosphatase: 74 U/L (ref 38–126)
Anion gap: 10 (ref 5–15)
BUN: 13 mg/dL (ref 6–20)
CO2: 24 mmol/L (ref 22–32)
Calcium: 9.1 mg/dL (ref 8.9–10.3)
Chloride: 103 mmol/L (ref 98–111)
Creatinine, Ser: 0.99 mg/dL (ref 0.61–1.24)
GFR calc Af Amer: >60 mL/min (ref >60)
GFR calc non Af Amer: >60 mL/min (ref >60)
Glucose, Bld: 92 mg/dL (ref 70–99)
Potassium: 3.9 mmol/L (ref 3.5–5.1)
Sodium: 137 mmol/L (ref 135–145)
Total Bilirubin: 0.3 mg/dL (ref 0.3–1.2)
Total Protein: 7.7 g/dL (ref 6.5–8.1)

## 2019-05-20 LAB — CBC
HCT: 50.8 % (ref 39.0–52.0)
Hemoglobin: 16.9 g/dL (ref 13.0–17.0)
MCH: 30.5 pg (ref 26.0–34.0)
MCHC: 33.3 g/dL (ref 30.0–36.0)
MCV: 91.7 fL (ref 80.0–100.0)
Platelets: 289 10*3/uL (ref 150–400)
RBC: 5.54 MIL/uL (ref 4.22–5.81)
RDW: 14.2 % (ref 11.5–15.5)
WBC: 9.3 10*3/uL (ref 4.0–10.5)
nRBC: 0 % (ref 0.0–0.2)

## 2019-05-20 LAB — URINALYSIS, ROUTINE W REFLEX MICROSCOPIC
Bilirubin Urine: NEGATIVE
Glucose, UA: NEGATIVE mg/dL
Hgb urine dipstick: NEGATIVE
Ketones, ur: 15 mg/dL — AB
Leukocytes,Ua: NEGATIVE
Nitrite: NEGATIVE
Protein, ur: NEGATIVE mg/dL
Specific Gravity, Urine: >1.03 — ABNORMAL HIGH (ref 1.005–1.030)
pH: 5.5 (ref 5.0–8.0)

## 2019-05-20 LAB — LIPASE, BLOOD: Lipase: 67 U/L — ABNORMAL HIGH (ref 11–51)

## 2019-05-20 MED ORDER — SODIUM CHLORIDE 0.9% FLUSH
3.0000 mL | Freq: Once | INTRAVENOUS | Status: DC
  Filled 2019-05-20: qty 3

## 2019-05-20 NOTE — ED Triage Notes (Signed)
For the past week he has had abdominal bloating and full feeling after eating small amounts of food. He used Mag Citrate Monday and Tuesday. He had several bowel movements with no relief. No nausea or vomiting.

## 2019-05-20 NOTE — ED Provider Notes (Signed)
[facility] Provider Note   CSN: [id number] Arrival date & time: 05/20/19  2040    History   Chief Complaint Chief Complaint  Patient presents with  . Abdominal Pain    HPI Aaron Weber is a 48 y.o. male.     HPI Is patient presents with abdominal pain.  Recurrent.  Has had for some years now.  Worse recently.  No fevers.  Pain is dull.  Worse after eating.  States the pain does seem to be worse after he eats cucumbers.  Has seen gastroenterology and had recent upper endoscopy.  States he thinks he may have diverticulitis since a friend had died and had similar symptoms.  No diarrhea.  States his stool is been a little harder.  States he has been taking different medicines including MiraLAX and magnesium citrate and fleets enema. Past Medical History:  Diagnosis Date  . Diabetes mellitus without complication ([place])   . GERD (gastroesophageal reflux disease)   . Hypertension   . Pleurisy     There are no active problems to display for this patient.   Past Surgical History:  Procedure Laterality Date  . TONSILLECTOMY          Home Medications    Prior to Admission medications   Medication Sig Start Date End Date Taking? Authorizing Provider  amLODipine (NORVASC) 5 MG tablet Take 5 mg by mouth daily.   Yes [provider]  bimatoprost (LUMIGAN) 0.03 % ophthalmic solution 1 drop at bedtime.   Yes [provider]  dicyclomine (BENTYL) 20 MG tablet Take 1 tablet (20 mg total) by mouth 2 (two) times daily. 11/23/18  Yes Ripley Fraise, MD  fluticasone [facility]) 50 MCG/ACT nasal spray Place 2 sprays into both nostrils daily. 03/17/15  Yes Palumbo, April, MD  loratadine (CLARITIN) 10 MG tablet Take 1 tablet (10 mg total) by mouth daily. 03/26/18  Yes Palumbo, April, MD  metFORMIN (GLUCOPHAGE) 500 MG tablet Take 1 tablet (500 mg total) by mouth 2 (two) times daily with a meal. 02/04/17  Yes Palumbo, April, MD  OMEPRAZOLE PO Take  by mouth.   Yes [provider]  loratadine (CLARITIN) 10 MG tablet Take 1 tablet (10 mg total) by mouth daily. 03/05/12 09/10/18  Palumbo, April, MD    Family History No family history on file.  Social History Social History   Tobacco Use  . Smoking status: Current Some Day Smoker    Packs/day: 0.50    Types: Cigars  . Smokeless tobacco: Former Network engineer Use Topics  . Alcohol use: Yes    Comment: rare  . Drug use: No     Allergies   Tomato   Review of Systems Review of Systems  Constitutional: Negative for appetite change.  Respiratory: Negative for shortness of breath.   Cardiovascular: Negative for chest pain.  Gastrointestinal: Positive for abdominal pain.  Genitourinary: Negative for flank pain.  Musculoskeletal: Negative for back pain.  Skin: Negative for rash.  Neurological: Negative for weakness.  Psychiatric/Behavioral: Negative for confusion.     Physical Exam Updated Vital Signs BP 122/82 (BP Location: Left Arm)   Pulse 73   Temp 98.1 F (36.7 C) (Oral)   Resp 18   Ht 5\' 11"  (1.803 m)   Wt 97.1 kg   SpO2 97%   BMI 29.85 kg/m   Physical Exam Vitals signs and nursing note reviewed.  HENT:     Head: Normocephalic and atraumatic.  Cardiovascular:  Rate and Rhythm: Normal rate and regular rhythm.  Abdominal:     General: There is no abdominal bruit.     Tenderness: There is no abdominal tenderness.  Skin:    Capillary Refill: Capillary refill takes less than 2 seconds.  Neurological:     Mental Status: He is alert.     Motor: No weakness.      ED Treatments / Results  Labs (all labs ordered are listed, but only abnormal results are displayed) Labs Reviewed  LIPASE, BLOOD - Abnormal; Notable for the following components:      Result Value   Lipase 67 (*)    All other components within normal limits  URINALYSIS, ROUTINE W REFLEX MICROSCOPIC - Abnormal; Notable for the following components:   Specific Gravity, Urine  >1.030 (*)    Ketones, ur 15 (*)    All other components within normal limits  COMPREHENSIVE METABOLIC PANEL  CBC    EKG None  Radiology Dg Abd 2 Views  Result Date: 05/20/2019 CLINICAL DATA:  [age] year old male with generalized abdominal pain and bloating. EXAM: ABDOMEN - 2 VIEW COMPARISON:  Abdominal radiograph dated 12/12/2018 and CT of the abdomen pelvis dated 04/02/2019. FINDINGS: There is moderate stool throughout the colon. No bowel dilatation or evidence of obstruction. No free air or radiopaque calculi. The osseous structures are unremarkable. Bibasilar densities may represent atelectasis. Clinical correlation is recommended to exclude infiltrate. IMPRESSION: No evidence of bowel obstruction. Electronically Signed   By: Elgie CollardArash  Radparvar M.D.   On: 05/20/2019 22:02    Procedures Procedures (including critical care time)  Medications Ordered in ED Medications  sodium chloride flush (NS) 0.9 % injection 3 mL (3 mLs Intravenous Not Given 05/20/19 2116)     Initial Impression / Assessment and Plan / ED Course  I have reviewed the triage vital signs and the nursing notes.  Pertinent labs & imaging results that were available during my care of the patient were reviewed by me and considered in my medical decision making (see chart for details).        Patient with abdominal pain.  Acute on chronic.  Had multiple prior CTs without clear cause.  Has gastroenterology follow-up.  Lab work reassuring.  Lipase mildly elevated but still only barely above normal.  Reviewed previous CTs.  X-ray reassuring but does show some moderate amount of stool.  Discharge home with ENT follow-up.  Final Clinical Impressions(s) / ED Diagnoses   Final diagnoses:  Abdominal pain    ED Discharge Orders    None       Benjiman CorePickering, Ande Therrell, MD 05/21/19 [phone]

## 2019-05-20 NOTE — ED Notes (Signed)
Pt understood dc material. NAD noted. All questions answered to satisfaction. Pt escorted to check out window 

## 2019-05-20 NOTE — ED Notes (Signed)
Pt to XR at this time

## 2019-05-20 NOTE — ED Notes (Signed)
ED Provider at bedside. 

## 2019-11-09 ENCOUNTER — Encounter (HOSPITAL_BASED_OUTPATIENT_CLINIC_OR_DEPARTMENT_OTHER): Payer: Self-pay | Admitting: *Deleted

## 2019-11-09 ENCOUNTER — Emergency Department (HOSPITAL_BASED_OUTPATIENT_CLINIC_OR_DEPARTMENT_OTHER): Payer: Self-pay

## 2019-11-09 ENCOUNTER — Emergency Department (HOSPITAL_BASED_OUTPATIENT_CLINIC_OR_DEPARTMENT_OTHER)
Admission: EM | Admit: 2019-11-09 | Discharge: 2019-11-10 | Disposition: A | Discharge status: Home / Self Care | Payer: Self-pay | Attending: Emergency Medicine | Admitting: Emergency Medicine

## 2019-11-09 ENCOUNTER — Other Ambulatory Visit: Payer: Self-pay

## 2019-11-09 DIAGNOSIS — Z79899 Other long term (current) drug therapy: Secondary | ICD-10-CM | POA: Insufficient documentation

## 2019-11-09 DIAGNOSIS — Z7984 Long term (current) use of oral hypoglycemic drugs: Secondary | ICD-10-CM | POA: Insufficient documentation

## 2019-11-09 DIAGNOSIS — E119 Type 2 diabetes mellitus without complications: Secondary | ICD-10-CM | POA: Insufficient documentation

## 2019-11-09 DIAGNOSIS — K859 Acute pancreatitis without necrosis or infection, unspecified: Secondary | ICD-10-CM | POA: Insufficient documentation

## 2019-11-09 DIAGNOSIS — I10 Essential (primary) hypertension: Secondary | ICD-10-CM | POA: Insufficient documentation

## 2019-11-09 LAB — CBC WITH DIFFERENTIAL/PLATELET
Abs Immature Granulocytes: 0.02 10*3/uL (ref 0.00–0.07)
Basophils Absolute: 0.1 10*3/uL (ref 0.0–0.1)
Basophils Relative: 1 %
Eosinophils Absolute: 0.2 10*3/uL (ref 0.0–0.5)
Eosinophils Relative: 3 %
HCT: 50.5 % (ref 39.0–52.0)
Hemoglobin: 17.2 g/dL — ABNORMAL HIGH (ref 13.0–17.0)
Immature Granulocytes: 0 %
Lymphocytes Relative: 40 %
Lymphs Abs: 3 10*3/uL (ref 0.7–4.0)
MCH: 31.7 pg (ref 26.0–34.0)
MCHC: 34.1 g/dL (ref 30.0–36.0)
MCV: 93 fL (ref 80.0–100.0)
Monocytes Absolute: 0.6 10*3/uL (ref 0.1–1.0)
Monocytes Relative: 8 %
Neutro Abs: 3.6 10*3/uL (ref 1.7–7.7)
Neutrophils Relative %: 48 %
Platelets: 342 10*3/uL (ref 150–400)
RBC: 5.43 MIL/uL (ref 4.22–5.81)
RDW: 13.5 % (ref 11.5–15.5)
WBC: 7.4 10*3/uL (ref 4.0–10.5)
nRBC: 0 % (ref 0.0–0.2)

## 2019-11-09 LAB — LIPASE, BLOOD: Lipase: 55 U/L — ABNORMAL HIGH (ref 11–51)

## 2019-11-09 LAB — COMPREHENSIVE METABOLIC PANEL
ALT: 15 U/L (ref 0–44)
AST: 15 U/L (ref 15–41)
Albumin: 4.1 g/dL (ref 3.5–5.0)
Alkaline Phosphatase: 79 U/L (ref 38–126)
Anion gap: 6 (ref 5–15)
BUN: 10 mg/dL (ref 6–20)
CO2: 24 mmol/L (ref 22–32)
Calcium: 9.1 mg/dL (ref 8.9–10.3)
Chloride: 106 mmol/L (ref 98–111)
Creatinine, Ser: 0.95 mg/dL (ref 0.61–1.24)
GFR calc Af Amer: >60 mL/min (ref >60)
GFR calc non Af Amer: >60 mL/min (ref >60)
Glucose, Bld: 95 mg/dL (ref 70–99)
Potassium: 4.1 mmol/L (ref 3.5–5.1)
Sodium: 136 mmol/L (ref 135–145)
Total Bilirubin: 0.4 mg/dL (ref 0.3–1.2)
Total Protein: 7.8 g/dL (ref 6.5–8.1)

## 2019-11-09 LAB — URINALYSIS, ROUTINE W REFLEX MICROSCOPIC
Bilirubin Urine: NEGATIVE
Glucose, UA: NEGATIVE mg/dL
Hgb urine dipstick: NEGATIVE
Ketones, ur: NEGATIVE mg/dL
Leukocytes,Ua: NEGATIVE
Nitrite: NEGATIVE
Protein, ur: NEGATIVE mg/dL
Specific Gravity, Urine: 1.025 (ref 1.005–1.030)
pH: 5.5 (ref 5.0–8.0)

## 2019-11-09 MED ORDER — KETOROLAC TROMETHAMINE 30 MG/ML IJ SOLN
30.0000 mg | Freq: Once | INTRAMUSCULAR | Status: AC
  Administered 2019-11-10: 30 mg via INTRAVENOUS
  Filled 2019-11-09: qty 1

## 2019-11-09 MED ORDER — HYDROCODONE-ACETAMINOPHEN 5-325 MG PO TABS: 1.0000 | ORAL_TABLET | Freq: Four times a day (QID) | ORAL | 0 refills | Status: DC | PRN

## 2019-11-09 MED ORDER — IOHEXOL 300 MG/ML  SOLN
100.0000 mL | Freq: Once | INTRAMUSCULAR | Status: AC | PRN
  Administered 2019-11-09: 100 mL via INTRAVENOUS

## 2019-11-09 MED ORDER — SODIUM CHLORIDE 0.9 % IV BOLUS
1000.0000 mL | Freq: Once | INTRAVENOUS | Status: AC
  Administered 2019-11-09: 1000 mL via INTRAVENOUS

## 2019-11-09 NOTE — ED Triage Notes (Signed)
Pt abd pain and bloating 10 days denies n/v/d

## 2019-11-09 NOTE — ED Provider Notes (Signed)
[facility] Provider Note   CSN: [id number] Arrival date & time: 11/09/19  2055     History Chief Complaint  Patient presents with  . Abdominal Pain    Aaron Weber is a 49 y.o. male.  HPI Patient presents to the emergency department with abdominal pain that is been ongoing over the last 10 days.  Patient states he had similar episodes in the past and was seen by his GI doctor.  The GI doctor did a colonoscopy and endoscopy and remove several polyps from his stomach and his large intestine.  Patient states that nothing seems to make the condition better but palpation makes the pain worse.  He states he has had no nausea vomiting or diarrhea.  The patient denies chest pain, shortness of breath, headache,blurred vision, neck pain, fever, cough, weakness, numbness, dizziness, anorexia, edema,  rash, back pain, dysuria, hematemesis, bloody stool, near syncope, or syncope.    Past Medical History:  Diagnosis Date  . Diabetes mellitus without complication ([place])   . GERD (gastroesophageal reflux disease)   . Hypertension   . Pleurisy     There are no problems to display for this patient.   Past Surgical History:  Procedure Laterality Date  . TONSILLECTOMY         No family history on file.  Social History   Tobacco Use  . Smoking status: Current Some Day Smoker    Packs/day: 0.50    Types: Cigars  . Smokeless tobacco: Former Network engineer Use Topics  . Alcohol use: Yes    Comment: rare  . Drug use: No    Home Medications Prior to Admission medications   Medication Sig Start Date End Date Taking? Authorizing Provider  amLODipine (NORVASC) 5 MG tablet Take 5 mg by mouth daily.    [provider]  bimatoprost (LUMIGAN) 0.03 % ophthalmic solution 1 drop at bedtime.    [provider]  dicyclomine (BENTYL) 20 MG tablet Take 1 tablet (20 mg total) by mouth 2 (two) times daily. 11/23/18   Ripley Fraise, MD    fluticasone (FLONASE) 50 MCG/ACT nasal spray Place 2 sprays into both nostrils daily. 03/17/15   Palumbo, April, MD  loratadine (CLARITIN) 10 MG tablet Take 1 tablet (10 mg total) by mouth daily. 03/05/12 09/10/18  Palumbo, April, MD  loratadine (CLARITIN) 10 MG tablet Take 1 tablet (10 mg total) by mouth daily. 03/26/18   Palumbo, April, MD  metFORMIN (GLUCOPHAGE) 500 MG tablet Take 1 tablet (500 mg total) by mouth 2 (two) times daily with a meal. 02/04/17   Palumbo, April, MD  OMEPRAZOLE PO Take by mouth.    [provider]    Allergies    Tomato  Review of Systems   Review of Systems All other systems negative except as documented in the HPI. All pertinent positives and negatives as reviewed in the HPI.  Physical Exam Updated Vital Signs BP 131/87   Temp 99.3 F (37.4 C) (Oral)   Resp 16   Ht 5' 11.5" (1.816 m)   Wt 97.1 kg   SpO2 100%   BMI 29.43 kg/m   Physical Exam Vitals and nursing note reviewed.  Constitutional:      General: He is not in acute distress.    Appearance: He is well-developed.  HENT:     Head: Normocephalic and atraumatic.  Eyes:     Pupils: Pupils are equal, round, and reactive to light.  Cardiovascular:  Rate and Rhythm: Normal rate and regular rhythm.     Heart sounds: Normal heart sounds. No murmur. No friction rub. No gallop.   Pulmonary:     Effort: Pulmonary effort is normal. No respiratory distress.     Breath sounds: Normal breath sounds. No wheezing.  Abdominal:     General: Bowel sounds are normal. There is no distension.     Palpations: Abdomen is soft.     Tenderness: There is generalized abdominal tenderness. There is no guarding or rebound.     Hernia: No hernia is present.  Musculoskeletal:     Cervical back: Normal range of motion and neck supple.  Skin:    General: Skin is warm and dry.     Capillary Refill: Capillary refill takes less than 2 seconds.     Findings: No erythema or rash.  Neurological:     Mental  Status: He is alert and oriented to person, place, and time.     Motor: No abnormal muscle tone.     Coordination: Coordination normal.  Psychiatric:        Behavior: Behavior normal.     ED Results / Procedures / Treatments   Labs (all labs ordered are listed, but only abnormal results are displayed) Labs Reviewed  COMPREHENSIVE METABOLIC PANEL  LIPASE, BLOOD  CBC WITH DIFFERENTIAL/PLATELET  URINALYSIS, ROUTINE W REFLEX MICROSCOPIC    EKG None  Radiology No results found.  Procedures Procedures (including critical care time)  Medications Ordered in ED Medications  sodium chloride 0.9 % bolus 1,000 mL (has no administration in time range)    ED Course  I have reviewed the triage vital signs and the nursing notes.  Pertinent labs & imaging results that were available during my care of the patient were reviewed by me and considered in my medical decision making (see chart for details).    MDM Rules/Calculators/A&P                     Patient will need CT scan imaging along with laboratory testing.  We will give IV fluids as well.  Will await the results of these test for any further decisions are made about the patient's care. Final Clinical Impression(s) / ED Diagnoses Final diagnoses:  None    Rx / DC Orders ED Discharge Orders    None       Charlestine Night, PA-C 11/09/19 2219    Vanetta Mulders, MD 11/19/19 [phone]

## 2019-11-09 NOTE — Discharge Instructions (Signed)
Your pancreatic enzymes were elevated.  You need to follow a clear liquid diet over the next 48 hours.  CT scan does not show any abnormalities at this time.

## 2019-11-09 NOTE — ED Notes (Signed)
Patient denies N/V/D. 

## 2020-01-16 ENCOUNTER — Encounter (HOSPITAL_BASED_OUTPATIENT_CLINIC_OR_DEPARTMENT_OTHER): Payer: Self-pay | Admitting: *Deleted

## 2020-01-16 ENCOUNTER — Other Ambulatory Visit: Payer: Self-pay

## 2020-01-16 ENCOUNTER — Emergency Department (HOSPITAL_BASED_OUTPATIENT_CLINIC_OR_DEPARTMENT_OTHER)
Admission: EM | Admit: 2020-01-16 | Discharge: 2020-01-16 | Disposition: A | Discharge status: Home / Self Care | Payer: Self-pay | Attending: Emergency Medicine | Admitting: Emergency Medicine

## 2020-01-16 DIAGNOSIS — L739 Follicular disorder, unspecified: Secondary | ICD-10-CM | POA: Insufficient documentation

## 2020-01-16 DIAGNOSIS — F17210 Nicotine dependence, cigarettes, uncomplicated: Secondary | ICD-10-CM | POA: Insufficient documentation

## 2020-01-16 DIAGNOSIS — Z7984 Long term (current) use of oral hypoglycemic drugs: Secondary | ICD-10-CM | POA: Insufficient documentation

## 2020-01-16 DIAGNOSIS — Z79899 Other long term (current) drug therapy: Secondary | ICD-10-CM | POA: Insufficient documentation

## 2020-01-16 DIAGNOSIS — I10 Essential (primary) hypertension: Secondary | ICD-10-CM | POA: Insufficient documentation

## 2020-01-16 DIAGNOSIS — E119 Type 2 diabetes mellitus without complications: Secondary | ICD-10-CM | POA: Insufficient documentation

## 2020-01-16 MED ORDER — CLINDAMYCIN HCL 300 MG PO CAPS: 300.0000 mg | ORAL_CAPSULE | Freq: Three times a day (TID) | ORAL | 0 refills | Status: AC

## 2020-01-16 NOTE — ED Triage Notes (Signed)
Abscess left inner thigh.

## 2020-01-16 NOTE — ED Provider Notes (Signed)
[facility]ENCY DEPARTMENT Provider Note   CSN: [id number] Arrival date & time: 01/16/20  2240     History Chief Complaint  Patient presents with  . Abscess    Aaron Weber is a 49 y.o. male.  The history is provided by the patient.  Illness Location:  Left thigh Severity:  Mild Onset quality:  Gradual Timing:  Constant Progression:  Unchanged Chronicity:  New Context:  Possible left groin abcess Relieved by:  Nothing Worsened by:  Nothing  Associated symptoms: no abdominal pain, no diarrhea, no fever, no loss of consciousness, no myalgias, no nausea, no rash and no vomiting        Past Medical History:  Diagnosis Date  . Diabetes mellitus without complication ([facility])   . GERD (gastroesophageal reflux disease)   . Hypertension   . Pleurisy     There are no problems to display for this patient.   Past Surgical History:  Procedure Laterality Date  . TONSILLECTOMY         No family history on file.  Social History   Tobacco Use  . Smoking status: Current Some Day Smoker    Packs/day: 0.50    Types: Cigars  . Smokeless tobacco: Former Engineer, water Use Topics  . Alcohol use: Yes    Comment: rare  . Drug use: No    Home Medications Prior to Admission medications   Medication Sig Start Date End Date Taking? Authorizing Provider  amLODipine (NORVASC) 5 MG tablet Take 5 mg by mouth daily.    [provider]  bimatoprost (LUMIGAN) 0.03 % ophthalmic solution 1 drop at bedtime.    [provider]  clindamycin (CLEOCIN) 300 MG capsule Take 1 capsule (300 mg total) by mouth 3 (three) times daily for 10 days. 01/16/20 01/26/20  Imari Sivertsen, DO  dicyclomine (BENTYL) 20 MG tablet Take 1 tablet (20 mg total) by mouth 2 (two) times daily. 11/23/18   Zadie Rhine, MD  fluticasone (FLONASE) 50 MCG/ACT nasal spray Place 2 sprays into both nostrils daily. 03/17/15   Palumbo, April, MD  HYDROcodone-acetaminophen (NORCO/VICODIN)  5-325 MG tablet Take 1 tablet by mouth every 6 (six) hours as needed for moderate pain. 11/09/19   Lawyer, Cristal Deer, PA-C  loratadine (CLARITIN) 10 MG tablet Take 1 tablet (10 mg total) by mouth daily. 03/05/12 09/10/18  Palumbo, April, MD  loratadine (CLARITIN) 10 MG tablet Take 1 tablet (10 mg total) by mouth daily. 03/26/18   Palumbo, April, MD  metFORMIN (GLUCOPHAGE) 500 MG tablet Take 1 tablet (500 mg total) by mouth 2 (two) times daily with a meal. 02/04/17   Palumbo, April, MD  OMEPRAZOLE PO Take by mouth.    [provider]    Allergies    Tomato  Review of Systems   Review of Systems  Constitutional: Negative for fever.  Gastrointestinal: Negative for abdominal pain, diarrhea, nausea and vomiting.  Genitourinary: Negative for decreased urine volume, difficulty urinating, discharge, dysuria, enuresis, flank pain, frequency, genital sores, hematuria, penile pain, penile swelling, scrotal swelling, testicular pain and urgency.  Musculoskeletal: Negative for arthralgias and myalgias.  Skin: Positive for wound. Negative for color change, pallor and rash.  Neurological: Negative for loss of consciousness.    Physical Exam Updated Vital Signs BP (!) 143/93   Pulse 96   Temp 98 F (36.7 C) (Oral)   Resp 20   Ht 5' 11.5" (1.816 m)   Wt 97.1 kg   SpO2 98%   BMI 29.44  kg/m   Physical Exam Constitutional:      General: He is not in acute distress.    Appearance: He is not ill-appearing.  HENT:     Head: Normocephalic and atraumatic.  Eyes:     Extraocular Movements: Extraocular movements intact.     Pupils: Pupils are equal, round, and reactive to light.  Abdominal:     General: Abdomen is flat. There is no distension.     Palpations: There is no mass.     Tenderness: There is no abdominal tenderness. There is no guarding or rebound.     Hernia: No hernia is present.  Genitourinary:    Penis: Normal.      Testes: Normal.     Comments: No swelling or tenderness  to testicles or penis Musculoskeletal:        General: Normal range of motion.  Skin:    Capillary Refill: Capillary refill takes less than 2 seconds.     Findings: No erythema or lesion.     Comments: 1 cm small firm and indurated area in the left upper groin with no surrounding erythema or warmth, no drainage  Neurological:     Mental Status: He is alert.     ED Results / Procedures / Treatments   Labs (all labs ordered are listed, but only abnormal results are displayed) Labs Reviewed - No data to display  EKG None  Radiology No results found.  Procedures Procedures (including critical care time)  Medications Ordered in ED Medications - No data to display  ED Course  I have reviewed the triage vital signs and the nursing notes.  Pertinent labs & imaging results that were available during my care of the patient were reviewed by me and considered in my medical decision making (see chart for details).    MDM Rules/Calculators/A&P                       Aaron Weber is a 49 year old male with history of diabetes who presents to the ED with concern for left groin abscess.  Patient with normal vitals.  No fever.  Patient states over the last several days has developed abscess type issue to his left inner groin.  Patient has small indurated/firm area inside his upper left thigh.  Does not involve the scrotum.  No crepitus.  No erythema.  No warmth.  Possibly a lymph node or folliculitis.  Low concern for abscess at this time.  Very small and focal.  No concern for necrotizing fasciitis or other deep space infection.  Recommend warm compresses, will give antibiotics.  Recommend close follow-up with primary care doctor for wound check or repeat evaluation the ED if gets larger and more indurated.  Discharged in good condition.  This chart was dictated using voice recognition software.  Despite best efforts to proofread,  errors can occur which can change the documentation  meaning.    Final Clinical Impression(s) / ED Diagnoses Final diagnoses:  Folliculitis    Rx / DC Orders ED Discharge Orders         Ordered    clindamycin (CLEOCIN) 300 MG capsule  3 times daily     01/16/20 [place], [place], DO 01/16/20 2315

## 2020-03-11 ENCOUNTER — Emergency Department (HOSPITAL_BASED_OUTPATIENT_CLINIC_OR_DEPARTMENT_OTHER)
Admission: EM | Admit: 2020-03-11 | Discharge: 2020-03-11 | Disposition: A | Discharge status: Home / Self Care | Payer: Self-pay | Attending: Emergency Medicine | Admitting: Emergency Medicine

## 2020-03-11 ENCOUNTER — Encounter (HOSPITAL_BASED_OUTPATIENT_CLINIC_OR_DEPARTMENT_OTHER): Payer: Self-pay | Admitting: Emergency Medicine

## 2020-03-11 ENCOUNTER — Other Ambulatory Visit: Payer: Self-pay

## 2020-03-11 DIAGNOSIS — K0510 Chronic gingivitis, plaque induced: Secondary | ICD-10-CM | POA: Insufficient documentation

## 2020-03-11 DIAGNOSIS — Z79899 Other long term (current) drug therapy: Secondary | ICD-10-CM | POA: Insufficient documentation

## 2020-03-11 DIAGNOSIS — E119 Type 2 diabetes mellitus without complications: Secondary | ICD-10-CM | POA: Insufficient documentation

## 2020-03-11 DIAGNOSIS — Z7984 Long term (current) use of oral hypoglycemic drugs: Secondary | ICD-10-CM | POA: Insufficient documentation

## 2020-03-11 DIAGNOSIS — F17210 Nicotine dependence, cigarettes, uncomplicated: Secondary | ICD-10-CM | POA: Insufficient documentation

## 2020-03-11 DIAGNOSIS — I10 Essential (primary) hypertension: Secondary | ICD-10-CM | POA: Insufficient documentation

## 2020-03-11 MED ORDER — NAPROXEN 500 MG PO TABS: ORAL_TABLET | ORAL | 0 refills | Status: DC

## 2020-03-11 MED ORDER — AMOXICILLIN 500 MG PO CAPS
500.0000 mg | ORAL_CAPSULE | Freq: Once | ORAL | Status: AC
  Administered 2020-03-11: 500 mg via ORAL
  Filled 2020-03-11: qty 1

## 2020-03-11 MED ORDER — AMOXICILLIN 500 MG PO CAPS: 500.0000 mg | ORAL_CAPSULE | Freq: Three times a day (TID) | ORAL | 0 refills | Status: DC

## 2020-03-11 MED ORDER — NAPROXEN 250 MG PO TABS
500.0000 mg | ORAL_TABLET | Freq: Once | ORAL | Status: AC
  Administered 2020-03-11: 500 mg via ORAL
  Filled 2020-03-11: qty 2

## 2020-03-11 NOTE — ED Triage Notes (Signed)
R lower dental pain with swollen gums and "swollen lymph nodes". Pt states he thinks he has something stuck down in his gums. Denies fever.

## 2020-03-11 NOTE — ED Provider Notes (Signed)
[facility] Provider Note: [name], MD, FACEP  CSN: [id number] MRN: [id number] ARRIVAL: 03/11/20 at 0055 ROOM: [facility]08/[facility]   CHIEF COMPLAINT  Dental Pain   HISTORY OF PRESENT ILLNESS  03/11/20 2:10 AM Aaron Weber is a 49 y.o. male has had several days of pain and swelling of the gum on the buccal side of his right lower second premolar.  He rates his pain as a 6 out of 10, aching in nature.  It is worse with palpation of that gum.  He is also having a right anterior cervical lymphadenopathy associated with this.  He denies fever.  He has not taken anything for the pain.  He has a Education officer, community but has no pending appointment for about a month.   Past Medical History:  Diagnosis Date  . Diabetes mellitus without complication ([facility])   . GERD (gastroesophageal reflux disease)   . Hypertension   . Pleurisy     Past Surgical History:  Procedure Laterality Date  . TONSILLECTOMY      No family history on file.  Social History   Tobacco Use  . Smoking status: Current Some Day Smoker    Packs/day: 0.50    Types: Cigars  . Smokeless tobacco: Former Engineer, water Use Topics  . Alcohol use: Yes    Comment: rare  . Drug use: No    Prior to Admission medications   Medication Sig Start Date End Date Taking? Authorizing Provider  amLODipine (NORVASC) 5 MG tablet Take 5 mg by mouth daily.    [provider]  amoxicillin (AMOXIL) 500 MG capsule Take 1 capsule (500 mg total) by mouth 3 (three) times daily. 03/11/20   Dasani Crear, MD  bimatoprost (LUMIGAN) 0.03 % ophthalmic solution 1 drop at bedtime.    [provider]  fluticasone (FLONASE) 50 MCG/ACT nasal spray Place 2 sprays into both nostrils daily. 03/17/15   Palumbo, April, MD  metFORMIN (GLUCOPHAGE) 500 MG tablet Take 1 tablet (500 mg total) by mouth 2 (two) times daily with a meal. 02/04/17   Palumbo, April, MD  naproxen (NAPROSYN) 500 MG tablet Take 1 tablet twice daily as needed for dental  pain. 03/11/20   Jabori Henegar, MD  OMEPRAZOLE PO Take by mouth.    [provider]  dicyclomine (BENTYL) 20 MG tablet Take 1 tablet (20 mg total) by mouth 2 (two) times daily. 11/23/18 03/11/20  Zadie Rhine, MD  loratadine (CLARITIN) 10 MG tablet Take 1 tablet (10 mg total) by mouth daily. 03/26/18 03/11/20  Palumbo, April, MD    Allergies Tomato   REVIEW OF SYSTEMS  Negative except as noted here or in the History of Present Illness.   PHYSICAL EXAMINATION  Initial Vital Signs Blood pressure 124/85, pulse 84, temperature 98 F (36.7 C), temperature source Oral, resp. rate 17, height 5' 11.5" (1.816 m), weight 97.1 kg, SpO2 99 %.  Examination General: Well-developed, well-nourished male in no acute distress; appearance consistent with age of record HENT: normocephalic; atraumatic; tender, swollen gum on the buccal side of the right lower second premolar without fluctuance Eyes: Normal appearance Neck: supple; right anterior cervical lymphadenopathy Heart: regular rate and rhythm Lungs: clear to auscultation bilaterally Abdomen: soft; nondistended; nontender; bowel sounds present Extremities: No deformity; full range of motion Neurologic: Awake, alert and oriented; motor function intact in all extremities and symmetric; no facial droop Skin: Warm and dry Psychiatric: Normal mood and affect   RESULTS  Summary of this visit's results, reviewed and  interpreted by myself:   EKG Interpretation  Date/Time:    Ventricular Rate:    PR Interval:    QRS Duration:   QT Interval:    QTC Calculation:   R Axis:     Text Interpretation:        Laboratory Studies: No results found for this or any previous visit (from the past 24 hour(s)). Imaging Studies: No results found.  ED COURSE and MDM  Nursing notes, initial and subsequent vitals signs, including pulse oximetry, reviewed and interpreted by myself.  Vitals:   03/11/20 0102 03/11/20 0103  BP: 124/85   Pulse: 84    Resp: 17   Temp: 98 F (36.7 C)   TempSrc: Oral   SpO2: 99%   Weight:  97.1 kg  Height:  5' 11.5" (1.816 m)   Medications  naproxen (NAPROSYN) tablet 500 mg (has no administration in time range)  amoxicillin (AMOXIL) capsule 500 mg (has no administration in time range)    We will place patient on antibiotics and refer to dentist on call.  Inflammation appears focal and does not appear to be ANUG.  PROCEDURES  Procedures   ED DIAGNOSES     ICD-10-CM   1. Gum inflammation  K05.10        Kylian Loh, MD 03/11/20 [phone]

## 2020-07-04 ENCOUNTER — Emergency Department (HOSPITAL_BASED_OUTPATIENT_CLINIC_OR_DEPARTMENT_OTHER)
Admission: EM | Admit: 2020-07-04 | Discharge: 2020-07-04 | Disposition: A | Discharge status: Home / Self Care | Payer: Self-pay | Attending: Emergency Medicine | Admitting: Emergency Medicine

## 2020-07-04 ENCOUNTER — Encounter (HOSPITAL_BASED_OUTPATIENT_CLINIC_OR_DEPARTMENT_OTHER): Payer: Self-pay | Admitting: Emergency Medicine

## 2020-07-04 ENCOUNTER — Other Ambulatory Visit: Payer: Self-pay

## 2020-07-04 DIAGNOSIS — J3489 Other specified disorders of nose and nasal sinuses: Secondary | ICD-10-CM | POA: Insufficient documentation

## 2020-07-04 DIAGNOSIS — R110 Nausea: Secondary | ICD-10-CM | POA: Insufficient documentation

## 2020-07-04 DIAGNOSIS — Z7984 Long term (current) use of oral hypoglycemic drugs: Secondary | ICD-10-CM | POA: Insufficient documentation

## 2020-07-04 DIAGNOSIS — F17290 Nicotine dependence, other tobacco product, uncomplicated: Secondary | ICD-10-CM | POA: Insufficient documentation

## 2020-07-04 DIAGNOSIS — R42 Dizziness and giddiness: Secondary | ICD-10-CM | POA: Insufficient documentation

## 2020-07-04 DIAGNOSIS — Z79899 Other long term (current) drug therapy: Secondary | ICD-10-CM | POA: Insufficient documentation

## 2020-07-04 DIAGNOSIS — I10 Essential (primary) hypertension: Secondary | ICD-10-CM | POA: Insufficient documentation

## 2020-07-04 DIAGNOSIS — E119 Type 2 diabetes mellitus without complications: Secondary | ICD-10-CM | POA: Insufficient documentation

## 2020-07-04 MED ORDER — DIAZEPAM 5 MG/ML IJ SOLN
5.0000 mg | Freq: Once | INTRAMUSCULAR | Status: AC
  Administered 2020-07-04: 5 mg via INTRAMUSCULAR
  Filled 2020-07-04: qty 2

## 2020-07-04 MED ORDER — ONDANSETRON 4 MG PO TBDP
8.0000 mg | ORAL_TABLET | Freq: Once | ORAL | Status: AC
  Administered 2020-07-04: 8 mg via ORAL
  Filled 2020-07-04: qty 2

## 2020-07-04 MED ORDER — DIAZEPAM 5 MG PO TABS: 5.0000 mg | ORAL_TABLET | Freq: Three times a day (TID) | ORAL | 0 refills | Status: AC | PRN

## 2020-07-04 MED ORDER — MECLIZINE HCL 25 MG PO TABS
25.0000 mg | ORAL_TABLET | Freq: Once | ORAL | Status: AC
  Administered 2020-07-04: 25 mg via ORAL
  Filled 2020-07-04: qty 1

## 2020-07-04 NOTE — ED Triage Notes (Signed)
Pt c/o dizziness since this morning. Symptoms are worse positionally.

## 2020-07-04 NOTE — ED Notes (Signed)
Pt ambulated to restroom without assistance. Tolerated well. °

## 2020-07-04 NOTE — ED Provider Notes (Signed)
[facility] Provider Note: Aaron Dell, MD, FACEP  CSN: [id number] MRN: [id number] ARRIVAL: 07/04/20 at 0141 ROOM: MH05/[facility]05   CHIEF COMPLAINT  Dizziness   HISTORY OF PRESENT ILLNESS  07/04/20 2:49 AM Aaron Weber is a 48 y.o. male with dizziness since yesterday morning.  The onset was gradual.  By dizziness he means a sense of disequilibrium or the feeling that he is going to fall.  This occurs when he moves his head or when he lies supine.  It is improved when he stays still sitting up.  He denies any ear pain, tinnitus or hearing loss.  He recently had a cold and is still having some sinus pressure.  He has had some occasional nausea but no vomiting.   Past Medical History:  Diagnosis Date   Diabetes mellitus without complication (HCC)    GERD (gastroesophageal reflux disease)    Hypertension    Pleurisy     Past Surgical History:  Procedure Laterality Date   TONSILLECTOMY      No family history on file.  Social History   Tobacco Use   Smoking status: Current Some Day Smoker    Packs/day: 0.50    Types: Cigars   Smokeless tobacco: Former Forensic psychologist Use: Never used  Substance Use Topics   Alcohol use: Yes    Comment: rare   Drug use: No    Prior to Admission medications   Medication Sig Start Date End Date Taking? Authorizing Provider  amLODipine (NORVASC) 5 MG tablet Take 5 mg by mouth daily.    [provider]  bimatoprost (LUMIGAN) 0.03 % ophthalmic solution 1 drop at bedtime.    [provider]  diazepam (VALIUM) 5 MG tablet Take 1 tablet (5 mg total) by mouth every 8 (eight) hours as needed (vertigo). 07/04/20   Aaron Bagdasarian, MD  fluticasone (FLONASE) 50 MCG/ACT nasal spray Place 2 sprays into both nostrils daily. 03/17/15   Palumbo, April, MD  metFORMIN (GLUCOPHAGE) 500 MG tablet Take 1 tablet (500 mg total) by mouth 2 (two) times daily with a meal. 02/04/17   Palumbo, April, MD  OMEPRAZOLE PO  Take by mouth.    [provider]  dicyclomine (BENTYL) 20 MG tablet Take 1 tablet (20 mg total) by mouth 2 (two) times daily. 11/23/18 03/11/20  Zadie Rhine, MD  loratadine (CLARITIN) 10 MG tablet Take 1 tablet (10 mg total) by mouth daily. 03/26/18 03/11/20  Palumbo, April, MD    Allergies Tomato   REVIEW OF SYSTEMS  Negative except as noted here or in the History of Present Illness.   PHYSICAL EXAMINATION  Initial Vital Signs Blood pressure (!) 150/100, pulse 95, temperature 98.3 F (36.8 C), temperature source Oral, resp. rate 18, height 5\' 11"  (1.803 m), weight 99.3 kg, SpO2 99 %.  Examination General: Well-developed, well-nourished male in no acute distress; appearance consistent with age of record HENT: normocephalic; atraumatic; TMs normal; movement of head reproduces symptoms Eyes: pupils equal, round and reactive to light; extraocular muscles intact; no nystagmus Neck: supple Heart: regular rate and rhythm Lungs: clear to auscultation bilaterally Abdomen: soft; nondistended; nontender; bowel sounds present Extremities: No deformity; full range of motion; pulses normal Neurologic: Awake, alert and oriented; motor function intact in all extremities and symmetric; no facial droop; normal coordination, speech and gait; normal finger-to-nose; no pronator drift Skin: Warm and dry Psychiatric: Normal mood and affect   RESULTS  Summary of this visit's results, reviewed and  interpreted by myself:   EKG Interpretation  Date/Time:    Ventricular Rate:    PR Interval:    QRS Duration:   QT Interval:    QTC Calculation:   R Axis:     Text Interpretation:        Laboratory Studies: No results found for this or any previous visit (from the past 24 hour(s)). Imaging Studies: No results found.  ED COURSE and MDM  Nursing notes, initial and subsequent vitals signs, including pulse oximetry, reviewed and interpreted by myself.  Vitals:   07/04/20 0147  07/04/20 0149  BP:  (!) 150/100  Pulse:  95  Resp:  18  Temp: 98.3 F (36.8 C)   TempSrc: Oral   SpO2:  99%  Weight: 99.3 kg   Height: 5\' 11"  (1.803 m)    Medications  ondansetron (ZOFRAN-ODT) disintegrating tablet 8 mg (8 mg Oral Given 07/04/20 0304)  meclizine (ANTIVERT) tablet 25 mg (25 mg Oral Given 07/04/20 0304)  diazepam (VALIUM) injection 5 mg (5 mg Intramuscular Given 07/04/20 0428)    4:17 AM No relief with meclizine.  5:08 AM Symptoms improved after IM Valium. Presentation is consistent with peripheral vertigo. I do not believe brain imaging is indicated at this time.   PROCEDURES  Procedures   ED DIAGNOSES     ICD-10-CM   1. Vertigo  R42        Aaron Hamre, MD 07/04/20 [phone]

## 2020-10-09 ENCOUNTER — Encounter (HOSPITAL_BASED_OUTPATIENT_CLINIC_OR_DEPARTMENT_OTHER): Payer: Self-pay | Admitting: Emergency Medicine

## 2020-10-09 ENCOUNTER — Other Ambulatory Visit: Payer: Self-pay

## 2020-10-09 ENCOUNTER — Emergency Department (HOSPITAL_BASED_OUTPATIENT_CLINIC_OR_DEPARTMENT_OTHER)
Admission: EM | Admit: 2020-10-09 | Discharge: 2020-10-09 | Disposition: A | Discharge status: Home / Self Care | Payer: HRSA Program | Attending: Emergency Medicine | Admitting: Emergency Medicine

## 2020-10-09 DIAGNOSIS — Z7984 Long term (current) use of oral hypoglycemic drugs: Secondary | ICD-10-CM | POA: Insufficient documentation

## 2020-10-09 DIAGNOSIS — E119 Type 2 diabetes mellitus without complications: Secondary | ICD-10-CM | POA: Diagnosis not present

## 2020-10-09 DIAGNOSIS — R0602 Shortness of breath: Secondary | ICD-10-CM

## 2020-10-09 DIAGNOSIS — Z20822 Contact with and (suspected) exposure to covid-19: Secondary | ICD-10-CM | POA: Diagnosis not present

## 2020-10-09 DIAGNOSIS — Z79899 Other long term (current) drug therapy: Secondary | ICD-10-CM | POA: Insufficient documentation

## 2020-10-09 DIAGNOSIS — I10 Essential (primary) hypertension: Secondary | ICD-10-CM | POA: Diagnosis not present

## 2020-10-09 DIAGNOSIS — F17290 Nicotine dependence, other tobacco product, uncomplicated: Secondary | ICD-10-CM | POA: Diagnosis not present

## 2020-10-09 HISTORY — DX: Chronic gingivitis, plaque induced: K05.10

## 2020-10-09 HISTORY — DX: Acute pancreatitis without necrosis or infection, unspecified: K85.90

## 2020-10-09 LAB — SARS CORONAVIRUS 2 (TAT 6-24 HRS): SARS Coronavirus 2: NEGATIVE

## 2020-10-09 NOTE — Discharge Instructions (Signed)
Your Covid test is still pending.  Your EKG is reassuring.  Your lungs are clear.  For now isolate yourself as if your test is positive.

## 2020-10-09 NOTE — ED Triage Notes (Signed)
Pt c/o shob. Pt's room mate positive for Covid yesterday.

## 2020-10-09 NOTE — ED Provider Notes (Signed)
[facility] EMERGENCY DEPARTMENT Provider Note   CSN: [id number] Arrival date & time: 10/09/20  3474     History Chief Complaint  Patient presents with  . Shortness of Breath    Aaron Weber is a 49 y.o. male.  HPI Patient presents with mild shortness of breath.  Began last night.  Some slight dull in his mid chest.  No fevers.  States his roommate just tested positive for Covid but he does not have all the much contact with her.  No chest pain.  No fevers.  No swelling in his legs.  He has not had his Covid vaccine.  He is diabetic.  No blood in the stool.    Past Medical History:  Diagnosis Date  . Diabetes mellitus without complication (HCC)   . GERD (gastroesophageal reflux disease)   . Gingivitis   . Hypertension   . Pancreatitis   . Pleurisy     There are no problems to display for this patient.   Past Surgical History:  Procedure Laterality Date  . TONSILLECTOMY         No family history on file.  Social History   Tobacco Use  . Smoking status: Current Some Day Smoker    Packs/day: 0.50    Types: Cigars  . Smokeless tobacco: Former Clinical biochemist  . Vaping Use: Never used  Substance Use Topics  . Alcohol use: Yes    Comment: rare  . Drug use: No    Home Medications Prior to Admission medications   Medication Sig Start Date End Date Taking? Authorizing Provider  amLODipine (NORVASC) 5 MG tablet Take 5 mg by mouth daily.    [provider]  bimatoprost (LUMIGAN) 0.03 % ophthalmic solution 1 drop at bedtime.    [provider]  diazepam (VALIUM) 5 MG tablet Take 1 tablet (5 mg total) by mouth every 8 (eight) hours as needed (vertigo). 07/04/20   Molpus, John, MD  fluticasone (FLONASE) 50 MCG/ACT nasal spray Place 2 sprays into both nostrils daily. 03/17/15   Palumbo, April, MD  metFORMIN (GLUCOPHAGE) 500 MG tablet Take 1 tablet (500 mg total) by mouth 2 (two) times daily with a meal. 02/04/17   Palumbo, April, MD   OMEPRAZOLE PO Take by mouth.    [provider]  dicyclomine (BENTYL) 20 MG tablet Take 1 tablet (20 mg total) by mouth 2 (two) times daily. 11/23/18 03/11/20  Zadie Rhine, MD  loratadine (CLARITIN) 10 MG tablet Take 1 tablet (10 mg total) by mouth daily. 03/26/18 03/11/20  Palumbo, April, MD    Allergies    Tomato  Review of Systems   Review of Systems  Constitutional: Negative for diaphoresis, fatigue and fever.  HENT: Negative for congestion.   Respiratory: Positive for chest tightness and shortness of breath. Negative for cough.   Cardiovascular: Negative for chest pain.  Gastrointestinal: Negative for abdominal pain.  Musculoskeletal: Negative for back pain.  Skin: Negative for rash.  Neurological: Negative for weakness.  Psychiatric/Behavioral: Negative for confusion.    Physical Exam Updated Vital Signs BP (!) 145/94   Pulse 77   Temp 98.2 F (36.8 C) (Oral)   Resp 16   Ht 5\' 11"  (1.803 m)   Wt 101.6 kg   SpO2 99%   BMI 31.24 kg/m   Physical Exam Vitals and nursing note reviewed.  HENT:     Head: Atraumatic.  Cardiovascular:     Rate and Rhythm: Normal rate and regular rhythm.  Pulmonary:     Breath sounds: Normal breath sounds. No wheezing, rhonchi or rales.  Chest:     Chest wall: No tenderness.  Abdominal:     Tenderness: There is no abdominal tenderness.  Musculoskeletal:     Cervical back: Normal range of motion.     Right lower leg: No edema.     Left lower leg: No edema.  Skin:    Capillary Refill: Capillary refill takes less than 2 seconds.  Neurological:     Mental Status: He is alert and oriented to person, place, and time.     ED Results / Procedures / Treatments   Labs (all labs ordered are listed, but only abnormal results are displayed) Labs Reviewed  SARS CORONAVIRUS 2 (TAT 6-24 HRS)    EKG EKG Interpretation  Date/Time:  Tuesday October 09 2020 05:15:59 EST Ventricular Rate:  79 PR Interval:    QRS  Duration: 95 QT Interval:  356 QTC Calculation: 408 R Axis:   49 Text Interpretation: Sinus rhythm Minimal ST depression, inferior leads Confirmed by Benjiman Core [phone]) on 10/09/2020 6:09:06 AM   Radiology No results found.  Procedures Procedures (including critical care time)  Medications Ordered in ED Medications - No data to display  ED Course  I have reviewed the triage vital signs and the nursing notes.  Pertinent labs & imaging results that were available during my care of the patient were reviewed by me and considered in my medical decision making (see chart for details).    MDM Rules/Calculators/A&P                         Patient presented with shortness of breath.  Recent Covid exposure.  Lungs reassuring.  EKG reassuring.  Not hypoxic.  Doubt cardiac ischemia as a cause.  Doubt pulmonary embolism or pneumothorax.  Will get Covid test.  Discharge home and can be followed as an outpatient.  Final Clinical Impression(s) / ED Diagnoses Final diagnoses:  Shortness of breath    Rx / DC Orders ED Discharge Orders    None       Benjiman Core, MD 10/09/20 [phone]

## 2020-11-24 ENCOUNTER — Encounter (HOSPITAL_BASED_OUTPATIENT_CLINIC_OR_DEPARTMENT_OTHER): Payer: Self-pay

## 2020-11-24 ENCOUNTER — Other Ambulatory Visit: Payer: Self-pay

## 2020-11-24 ENCOUNTER — Emergency Department (HOSPITAL_BASED_OUTPATIENT_CLINIC_OR_DEPARTMENT_OTHER)
Admission: EM | Admit: 2020-11-24 | Discharge: 2020-11-24 | Disposition: A | Discharge status: Home / Self Care | Payer: Self-pay | Attending: Emergency Medicine | Admitting: Emergency Medicine

## 2020-11-24 DIAGNOSIS — Z79899 Other long term (current) drug therapy: Secondary | ICD-10-CM | POA: Insufficient documentation

## 2020-11-24 DIAGNOSIS — I10 Essential (primary) hypertension: Secondary | ICD-10-CM | POA: Insufficient documentation

## 2020-11-24 DIAGNOSIS — Z87891 Personal history of nicotine dependence: Secondary | ICD-10-CM | POA: Insufficient documentation

## 2020-11-24 DIAGNOSIS — K0889 Other specified disorders of teeth and supporting structures: Secondary | ICD-10-CM | POA: Insufficient documentation

## 2020-11-24 DIAGNOSIS — E119 Type 2 diabetes mellitus without complications: Secondary | ICD-10-CM | POA: Insufficient documentation

## 2020-11-24 DIAGNOSIS — Z7984 Long term (current) use of oral hypoglycemic drugs: Secondary | ICD-10-CM | POA: Insufficient documentation

## 2020-11-24 MED ORDER — AMOXICILLIN 500 MG PO CAPS: 500.0000 mg | ORAL_CAPSULE | Freq: Two times a day (BID) | ORAL | 0 refills | Status: AC

## 2020-11-24 MED ORDER — CHLORHEXIDINE GLUCONATE 0.12 % MT SOLN: 15.0000 mL | Freq: Two times a day (BID) | OROMUCOSAL | 0 refills | Status: AC

## 2020-11-24 NOTE — ED Notes (Signed)
Pt. Was seen by PA C Layden.  Pt. Treated according to assessment.Marland Kitchen given discharge instructions and verbally understood all d/c instructions and scripts.

## 2020-11-24 NOTE — ED Provider Notes (Signed)
[facility] EMERGENCY DEPARTMENT Provider Note   CSN: [id number] Arrival date & time: 11/24/20  1525     History Chief Complaint  Patient presents with  . Dental Pain    Aaron Weber is a 50 y.o. male asthma history of diabetes, GERD, gingivitis, retention who presents for evaluation of right lower dental pain that started this morning.  He states last night he had a burrito and he thinks part of it got stuck around his teeth.  He states that since then, he has had pain, irritation.  He reports this morning it was worse.  He states that he has not had any fevers, facial swelling, difficulty breathing.  He is not take any medications for the symptoms.  He reports this happened before.  He has not seen his dentist in a few months.  The history is provided by the patient.       Past Medical History:  Diagnosis Date  . Diabetes mellitus without complication (HCC)   . GERD (gastroesophageal reflux disease)   . Gingivitis   . Hypertension   . Pancreatitis   . Pleurisy     There are no problems to display for this patient.   Past Surgical History:  Procedure Laterality Date  . TONSILLECTOMY         No family history on file.  Social History   Tobacco Use  . Smoking status: Former Smoker    Packs/day: 0.50    Types: Cigars  . Smokeless tobacco: Former Clinical biochemist  . Vaping Use: Never used  Substance Use Topics  . Alcohol use: Yes    Comment: rare  . Drug use: No    Home Medications Prior to Admission medications   Medication Sig Start Date End Date Taking? Authorizing Provider  amoxicillin (AMOXIL) 500 MG capsule Take 1 capsule (500 mg total) by mouth 2 (two) times daily for 7 days. 11/24/20 12/01/20 Yes Maxwell Caul, PA-C  chlorhexidine (PERIDEX) 0.12 % solution Use as directed 15 mLs in the mouth or throat 2 (two) times daily. 11/24/20  Yes Graciella Freer A, PA-C  amLODipine (NORVASC) 5 MG tablet Take 5 mg by mouth daily.    [provider]  bimatoprost (LUMIGAN) 0.03 % ophthalmic solution 1 drop at bedtime.    [provider]  diazepam (VALIUM) 5 MG tablet Take 1 tablet (5 mg total) by mouth every 8 (eight) hours as needed (vertigo). 07/04/20   Molpus, John, MD  fluticasone (FLONASE) 50 MCG/ACT nasal spray Place 2 sprays into both nostrils daily. 03/17/15   Palumbo, April, MD  metFORMIN (GLUCOPHAGE) 500 MG tablet Take 1 tablet (500 mg total) by mouth 2 (two) times daily with a meal. 02/04/17   Palumbo, April, MD  OMEPRAZOLE PO Take by mouth.    [provider]  dicyclomine (BENTYL) 20 MG tablet Take 1 tablet (20 mg total) by mouth 2 (two) times daily. 11/23/18 03/11/20  Zadie Rhine, MD  loratadine (CLARITIN) 10 MG tablet Take 1 tablet (10 mg total) by mouth daily. 03/26/18 03/11/20  Palumbo, April, MD    Allergies    Tomato  Review of Systems   Review of Systems  Constitutional: Negative for fever.  HENT: Positive for dental problem. Negative for facial swelling and trouble swallowing.   Respiratory: Negative for shortness of breath.   All other systems reviewed and are negative.   Physical Exam Updated Vital Signs BP 121/86 (BP Location: Right Arm)   Pulse 97  Temp 98 F (36.7 C) (Oral)   Resp 18   Ht 5\' 11"  (1.803 m)   Wt 104.3 kg   SpO2 99%   BMI 32.08 kg/m   Physical Exam Vitals and nursing note reviewed.  Constitutional:      Appearance: He is well-developed and well-nourished.  HENT:     Head: Normocephalic and atraumatic.     Comments: Face is symmetric in appearance without any overlying warmth, erythema, edema.     Mouth/Throat:     Comments: Posterior oropharynx is clear without any erythema, edema. Right lower wisdom tooth has some mild surrounding gingival irritation. No obvious abscess.  Eyes:     General: No scleral icterus.       Right eye: No discharge.        Left eye: No discharge.     Extraocular Movements: EOM normal.     Conjunctiva/sclera: Conjunctivae  normal.  Pulmonary:     Effort: Pulmonary effort is normal.  Skin:    General: Skin is warm and dry.  Neurological:     Mental Status: He is alert.  Psychiatric:        Mood and Affect: Mood and affect normal.        Speech: Speech normal.        Behavior: Behavior normal.     ED Results / Procedures / Treatments   Labs (all labs ordered are listed, but only abnormal results are displayed) Labs Reviewed - No data to display  EKG None  Radiology No results found.  Procedures Procedures   Medications Ordered in ED Medications - No data to display  ED Course  I have reviewed the triage vital signs and the nursing notes.  Pertinent labs & imaging results that were available during my care of the patient were reviewed by me and considered in my medical decision making (see chart for details).    MDM Rules/Calculators/A&P                          50 yo M presents with dental pain x1 day that began after eating a burrito. He has some mild gingival irritation noted to the wisdom tooth on the right lower side. No evidence of abscess requiring immediate incision and drainage. Exam not concerning for Ludwig's angina or pharyngeal abscess. Will treat with amoxicillin. Patient instructed to follow-up with dentist referral provided. Stable for discharge at this time. Strict return precautions discussed. Patient expresses understanding and agreement to plan.   Portions of this note were generated with 44. Dictation errors may occur despite best attempts at proofreading.  Final Clinical Impression(s) / ED Diagnoses Final diagnoses:  Pain, dental    Rx / DC Orders ED Discharge Orders         Ordered    chlorhexidine (PERIDEX) 0.12 % solution  2 times daily        11/24/20 1733    amoxicillin (AMOXIL) 500 MG capsule  2 times daily        11/24/20 1733           01/22/21 11/24/20 1737    01/22/21, DO 11/24/20 2238

## 2020-11-24 NOTE — Discharge Instructions (Signed)
Take antibiotics as directed. Please take all of your antibiotics until finished.  You can take Tylenol or Ibuprofen as directed for pain. You can alternate Tylenol and Ibuprofen every 4 hours. If you take Tylenol at 1pm, then you can take Ibuprofen at 5pm. Then you can take Tylenol again at 9pm.   The exam and treatment you received today has been provided on an emergency basis only. This is not a substitute for complete medical or dental care. If your problem worsens or new symptoms (problems) appear, and you are unable to arrange prompt follow-up care with your dentist, call or return to this location. If you do not have a dentist, please follow-up with one on the list provided  CALL YOUR DENTIST OR RETURN IMMEDIATELY IF you develop a fever, rash, difficulty breathing or swallowing, neck or facial swelling, or other potentially serious concerns.   Please follow-up with one of the dental clinics provided to you below or in your paperwork. Call and tell them you were seen in the Emergency Dept and arrange for an appointment. You may have to call multiple places in order to find a place to be seen.  Dental Assistance If the dentist on-call cannot see you, please use the resources below:   Patients with Medicaid: [facility] [address], [phone], [phone]  If unable to pay, or uninsured, contact [facility] ([phone]) or [facility] ([phone] in [place], [phone] in [place]) to become qualified for the adult dental clinic  Other Low-Cost Community Dental Services: [facility]- [address], [place], [place], [address]    [phone], Ext. [phone]    2nd and 4th Thursday of the month at 6:30am    10 clients each day by appointment, can sometimes see walk-in     patients if someone does not show for an appointment [facility]- [address], [place], [place], [address]    [phone]  [facility]- [address], [place], [place], [address]    [phone]  [facility]- [phone] [facility]- [phone] [facility]- [phone]  

## 2020-11-24 NOTE — ED Triage Notes (Signed)
Pt presents with complaints of R lower dental pain x 1 day.

## 2020-12-31 ENCOUNTER — Emergency Department (HOSPITAL_BASED_OUTPATIENT_CLINIC_OR_DEPARTMENT_OTHER): Payer: Self-pay

## 2020-12-31 ENCOUNTER — Other Ambulatory Visit: Payer: Self-pay

## 2020-12-31 ENCOUNTER — Encounter (HOSPITAL_BASED_OUTPATIENT_CLINIC_OR_DEPARTMENT_OTHER): Payer: Self-pay | Admitting: *Deleted

## 2020-12-31 ENCOUNTER — Emergency Department (HOSPITAL_BASED_OUTPATIENT_CLINIC_OR_DEPARTMENT_OTHER)
Admission: EM | Admit: 2020-12-31 | Discharge: 2020-12-31 | Disposition: A | Discharge status: Home / Self Care | Payer: Self-pay | Attending: Emergency Medicine | Admitting: Emergency Medicine

## 2020-12-31 DIAGNOSIS — R072 Precordial pain: Secondary | ICD-10-CM | POA: Insufficient documentation

## 2020-12-31 DIAGNOSIS — Z7984 Long term (current) use of oral hypoglycemic drugs: Secondary | ICD-10-CM | POA: Insufficient documentation

## 2020-12-31 DIAGNOSIS — I10 Essential (primary) hypertension: Secondary | ICD-10-CM | POA: Insufficient documentation

## 2020-12-31 DIAGNOSIS — R141 Gas pain: Secondary | ICD-10-CM | POA: Insufficient documentation

## 2020-12-31 DIAGNOSIS — R0789 Other chest pain: Secondary | ICD-10-CM

## 2020-12-31 DIAGNOSIS — Z79899 Other long term (current) drug therapy: Secondary | ICD-10-CM | POA: Insufficient documentation

## 2020-12-31 DIAGNOSIS — E119 Type 2 diabetes mellitus without complications: Secondary | ICD-10-CM | POA: Insufficient documentation

## 2020-12-31 DIAGNOSIS — Z87891 Personal history of nicotine dependence: Secondary | ICD-10-CM | POA: Insufficient documentation

## 2020-12-31 LAB — CBC
HCT: 40 % (ref 39.0–52.0)
Hemoglobin: 14 g/dL (ref 13.0–17.0)
MCH: 30.8 pg (ref 26.0–34.0)
MCHC: 35 g/dL (ref 30.0–36.0)
MCV: 88.1 fL (ref 80.0–100.0)
Platelets: 333 10*3/uL (ref 150–400)
RBC: 4.54 MIL/uL (ref 4.22–5.81)
RDW: 13 % (ref 11.5–15.5)
WBC: 7.4 10*3/uL (ref 4.0–10.5)
nRBC: 0 % (ref 0.0–0.2)

## 2020-12-31 LAB — TROPONIN I (HIGH SENSITIVITY)
Troponin I (High Sensitivity): 2 ng/L (ref <18)
Troponin I (High Sensitivity): 2 ng/L (ref <18)

## 2020-12-31 LAB — BASIC METABOLIC PANEL
Anion gap: 7 (ref 5–15)
BUN: 13 mg/dL (ref 6–20)
CO2: 26 mmol/L (ref 22–32)
Calcium: 9.2 mg/dL (ref 8.9–10.3)
Chloride: 103 mmol/L (ref 98–111)
Creatinine, Ser: 1.07 mg/dL (ref 0.61–1.24)
GFR, Estimated: >60 mL/min (ref >60)
Glucose, Bld: 123 mg/dL — ABNORMAL HIGH (ref 70–99)
Potassium: 3.9 mmol/L (ref 3.5–5.1)
Sodium: 136 mmol/L (ref 135–145)

## 2020-12-31 MED ORDER — NITROGLYCERIN 0.4 MG SL SUBL
0.4000 mg | SUBLINGUAL_TABLET | SUBLINGUAL | Status: DC | PRN
  Filled 2020-12-31: qty 1

## 2020-12-31 MED ORDER — ASPIRIN 81 MG PO CHEW
324.0000 mg | CHEWABLE_TABLET | Freq: Once | ORAL | Status: AC
  Administered 2020-12-31: 324 mg via ORAL
  Filled 2020-12-31: qty 4

## 2020-12-31 MED ORDER — LIDOCAINE VISCOUS HCL 2 % MT SOLN
15.0000 mL | Freq: Once | OROMUCOSAL | Status: AC
  Administered 2020-12-31: 15 mL via ORAL
  Filled 2020-12-31: qty 15

## 2020-12-31 MED ORDER — ALUM & MAG HYDROXIDE-SIMETH 200-200-20 MG/5ML PO SUSP
30.0000 mL | Freq: Once | ORAL | Status: AC
  Administered 2020-12-31: 30 mL via ORAL
  Filled 2020-12-31: qty 30

## 2020-12-31 NOTE — ED Notes (Signed)
Provider notified of pt's concern for having to be stuck for an IV.  PT states "I don't wanna be here for no 2 hours" after explaining the importance of the timeline for a cards workup.  Charge RN notified Angelique Blonder) and provider Vernona Rieger, [place]) notified of pt's concern.  PA to assess the need for further workup prior to blood draw and IV placement.

## 2020-12-31 NOTE — Discharge Instructions (Addendum)
Follow-up with your doctor for recheck as discussed.  Return to the ED for worsening or concerning symptoms.

## 2020-12-31 NOTE — ED Provider Notes (Signed)
[facility] EMERGENCY DEPARTMENT Provider Note   CSN: [id number] Arrival date & time: 12/31/20  1317     History Chief Complaint  Patient presents with  . Chest Pain    Aaron Weber is a 50 y.o. male.  50 year old male with history of diabetes, HTN, presents with complaint of mid sternal chest pain, described as a gas pain, onset around 9AM today while sitting on the sofa. Pain is constant, worse with taking a deep breath, does not radiate, no relief with OTC gas relief pill. Not associated with SHOB, nausea, or diaphoresis. No cardiac history, former smoker, no family cardiac history. No other complaints or concerns. Reports his daughter died 2 days ago.      HPI: A 24 year old patient with a history of treated diabetes, hypertension and obesity presents for evaluation of chest pain. Initial onset of pain was approximately 3-6 hours ago. The patient's chest pain is well-localized and is not worse with exertion. The patient's chest pain is not middle- or left-sided, is not described as heaviness/pressure/tightness, is not sharp and does not radiate to the arms/jaw/neck. The patient does not complain of nausea and denies diaphoresis. The patient has no history of stroke, has no history of peripheral artery disease, has not smoked in the past 90 days, has no relevant family history of coronary artery disease (first degree relative at less than age 46) and has no history of hypercholesterolemia.   Past Medical History:  Diagnosis Date  . Diabetes mellitus without complication (HCC)   . GERD (gastroesophageal reflux disease)   . Gingivitis   . Hypertension   . Pancreatitis   . Pleurisy     There are no problems to display for this patient.   Past Surgical History:  Procedure Laterality Date  . TONSILLECTOMY         No family history on file.  Social History   Tobacco Use  . Smoking status: Former Smoker    Packs/day: 0.50    Types: Cigars  . Smokeless  tobacco: Former Clinical biochemist  . Vaping Use: Never used  Substance Use Topics  . Alcohol use: Yes    Comment: rare  . Drug use: No    Home Medications Prior to Admission medications   Medication Sig Start Date End Date Taking? Authorizing Provider  amLODipine (NORVASC) 5 MG tablet Take 5 mg by mouth daily.   Yes [provider]  metFORMIN (GLUCOPHAGE) 500 MG tablet Take 1 tablet (500 mg total) by mouth 2 (two) times daily with a meal. 02/04/17  Yes Palumbo, April, MD  OMEPRAZOLE PO Take by mouth.   Yes [provider]  bimatoprost (LUMIGAN) 0.03 % ophthalmic solution 1 drop at bedtime.    [provider]  chlorhexidine (PERIDEX) 0.12 % solution Use as directed 15 mLs in the mouth or throat 2 (two) times daily. 11/24/20   Maxwell Caul, PA-C  diazepam (VALIUM) 5 MG tablet Take 1 tablet (5 mg total) by mouth every 8 (eight) hours as needed (vertigo). 07/04/20   Molpus, John, MD  fluticasone (FLONASE) 50 MCG/ACT nasal spray Place 2 sprays into both nostrils daily. 03/17/15   Palumbo, April, MD  dicyclomine (BENTYL) 20 MG tablet Take 1 tablet (20 mg total) by mouth 2 (two) times daily. 11/23/18 03/11/20  Zadie Rhine, MD  loratadine (CLARITIN) 10 MG tablet Take 1 tablet (10 mg total) by mouth daily. 03/26/18 03/11/20  Palumbo, April, MD    Allergies  Tomato  Review of Systems   Review of Systems  Constitutional: Negative for chills, diaphoresis, fatigue and fever.  Respiratory: Negative for shortness of breath.   Cardiovascular: Positive for chest pain. Negative for palpitations and leg swelling.  Gastrointestinal: Negative for abdominal pain, nausea and vomiting.  Musculoskeletal: Negative for back pain and neck pain.  Skin: Negative for rash and wound.  Allergic/Immunologic: Positive for immunocompromised state.  Neurological: Negative for weakness.  Psychiatric/Behavioral: Negative for confusion.  All other systems reviewed and are  negative.   Physical Exam Updated Vital Signs BP 119/73   Pulse 61   Temp 98.3 F (36.8 C) (Oral)   Resp 14   Ht 5\' 11"  (1.803 m)   Wt 102.5 kg   SpO2 100%   BMI 31.52 kg/m   Physical Exam Vitals and nursing note reviewed.  Constitutional:      General: He is not in acute distress.    Appearance: He is well-developed. He is not diaphoretic.  HENT:     Head: Normocephalic and atraumatic.  Cardiovascular:     Rate and Rhythm: Normal rate and regular rhythm.     Heart sounds: Normal heart sounds.  Pulmonary:     Effort: Pulmonary effort is normal.     Breath sounds: Normal breath sounds.  Chest:     Chest wall: No tenderness.  Abdominal:     Palpations: Abdomen is soft.     Tenderness: There is no abdominal tenderness.  Musculoskeletal:     Cervical back: Neck supple.     Right lower leg: No edema.     Left lower leg: No edema.  Skin:    General: Skin is warm and dry.     Findings: No erythema or rash.  Neurological:     Mental Status: He is alert and oriented to person, place, and time.  Psychiatric:        Behavior: Behavior normal.     ED Results / Procedures / Treatments   Labs (all labs ordered are listed, but only abnormal results are displayed) Labs Reviewed  BASIC METABOLIC PANEL - Abnormal; Notable for the following components:      Result Value   Glucose, Bld 123 (*)    All other components within normal limits  CBC  TROPONIN I (HIGH SENSITIVITY)  TROPONIN I (HIGH SENSITIVITY)    EKG EKG Interpretation  Date/Time:  Monday December 31 2020 13:29:56 EDT Ventricular Rate:  85 PR Interval:  160 QRS Duration: 94 QT Interval:  370 QTC Calculation: 440 R Axis:   38 Text Interpretation: Normal sinus rhythm Normal ECG No significant change since last tracing Confirmed by [id number] [phone]) on 12/31/2020 5:58:55 PM   Radiology DG Chest 2 View  Result Date: 12/31/2020 CLINICAL DATA:  Sudden onset central chest tightness. EXAM: CHEST - 2 VIEW  COMPARISON:  Chest x-ray dated March 26, 2018. FINDINGS: The heart size and mediastinal contours are within normal limits. Both lungs are clear. The visualized skeletal structures are unremarkable. IMPRESSION: No active cardiopulmonary disease. Electronically Signed   By: March 28, 2018 M.D.   On: 12/31/2020 14:01    Procedures Procedures   Medications Ordered in ED Medications  nitroGLYCERIN (NITROSTAT) SL tablet 0.4 mg (has no administration in time range)  aspirin chewable tablet 324 mg (324 mg Oral Given 12/31/20 1501)  alum & mag hydroxide-simeth (MAALOX/MYLANTA) 200-200-20 MG/5ML suspension 30 mL (30 mLs Oral Given 12/31/20 1802)    And  lidocaine (XYLOCAINE) 2 % viscous mouth solution  15 mL (15 mLs Oral Given 12/31/20 1802)    ED Course  I have reviewed the triage vital signs and the nursing notes.  Pertinent labs & imaging results that were available during my care of the patient were reviewed by me and considered in my medical decision making (see chart for details).  Clinical Course as of 12/31/20 1818  Mon Dec 31, 2020  3956 50 year old male with complaint of chest pain as above.  On exam, patient is in no distress, no chest wall tenderness.  Chest x-ray remarkable, EKG without ischemic changes.  Troponin is 2 and 2, no change.  CBC and BMP without significant changes. No change in symptoms with aspirin and nitro.  Patient given GI discharge lifestyle and agrees to recheck with his PCP, return to ED for worsening or concerning symptoms. [LM]    Clinical Course User Index [LM] Alden Hipp   MDM Rules/Calculators/A&P HEAR Score: 3                        Final Clinical Impression(s) / ED Diagnoses Final diagnoses:  Atypical chest pain    Rx / DC Orders ED Discharge Orders    None       Jeannie Fend, PA-C 12/31/20 1818    Terald Sleeper, MD 01/01/21 1355

## 2020-12-31 NOTE — ED Triage Notes (Signed)
Chest pain this am that feels like gas around his heart.

## 2021-08-12 ENCOUNTER — Emergency Department (HOSPITAL_BASED_OUTPATIENT_CLINIC_OR_DEPARTMENT_OTHER)
Admission: EM | Admit: 2021-08-12 | Discharge: 2021-08-12 | Disposition: A | Discharge status: Home / Self Care | Payer: Self-pay | Attending: Student | Admitting: Student

## 2021-08-12 ENCOUNTER — Encounter (HOSPITAL_BASED_OUTPATIENT_CLINIC_OR_DEPARTMENT_OTHER): Payer: Self-pay | Admitting: Emergency Medicine

## 2021-08-12 ENCOUNTER — Other Ambulatory Visit: Payer: Self-pay

## 2021-08-12 DIAGNOSIS — B349 Viral infection, unspecified: Secondary | ICD-10-CM | POA: Insufficient documentation

## 2021-08-12 DIAGNOSIS — Z20822 Contact with and (suspected) exposure to covid-19: Secondary | ICD-10-CM | POA: Insufficient documentation

## 2021-08-12 DIAGNOSIS — Z7984 Long term (current) use of oral hypoglycemic drugs: Secondary | ICD-10-CM | POA: Insufficient documentation

## 2021-08-12 DIAGNOSIS — Z28310 Unvaccinated for covid-19: Secondary | ICD-10-CM | POA: Insufficient documentation

## 2021-08-12 DIAGNOSIS — Z79899 Other long term (current) drug therapy: Secondary | ICD-10-CM | POA: Insufficient documentation

## 2021-08-12 DIAGNOSIS — E119 Type 2 diabetes mellitus without complications: Secondary | ICD-10-CM | POA: Insufficient documentation

## 2021-08-12 DIAGNOSIS — Z87891 Personal history of nicotine dependence: Secondary | ICD-10-CM | POA: Insufficient documentation

## 2021-08-12 DIAGNOSIS — I10 Essential (primary) hypertension: Secondary | ICD-10-CM | POA: Insufficient documentation

## 2021-08-12 NOTE — ED Provider Notes (Signed)
[facility] EMERGENCY DEPARTMENT Provider Note   CSN: [id number] Arrival date & time: 08/12/21  1808     History Chief Complaint  Patient presents with   Covid Exposure    Aaron Weber is a 50 y.o. male with PMHx HTN and diabetes who presents to the ED today with complaint of gradual onset, constant, fatigue that began earlier today. Pt also complains of body aches, headache, and SOB. He states his 52 year old currently has COVID. Pt is unvaccinated. He did not take anything for his symptoms prior to arrival. No fevers, chills, or cough. He has no other complaints at this time.   The history is provided by the patient and medical records.      Past Medical History:  Diagnosis Date   Diabetes mellitus without complication (HCC)    GERD (gastroesophageal reflux disease)    Gingivitis    Hypertension    Pancreatitis    Pleurisy     There are no problems to display for this patient.   Past Surgical History:  Procedure Laterality Date   TONSILLECTOMY         History reviewed. No pertinent family history.  Social History   Tobacco Use   Smoking status: Former    Packs/day: 0.50    Types: Cigars, Cigarettes   Smokeless tobacco: Never  Vaping Use   Vaping Use: Never used  Substance Use Topics   Alcohol use: Yes    Comment: rare   Drug use: No    Home Medications Prior to Admission medications   Medication Sig Start Date End Date Taking? Authorizing Provider  amLODipine (NORVASC) 5 MG tablet Take 5 mg by mouth daily.    [provider]  bimatoprost (LUMIGAN) 0.03 % ophthalmic solution 1 drop at bedtime.    [provider]  chlorhexidine (PERIDEX) 0.12 % solution Use as directed 15 mLs in the mouth or throat 2 (two) times daily. 11/24/20   Maxwell Caul, PA-C  diazepam (VALIUM) 5 MG tablet Take 1 tablet (5 mg total) by mouth every 8 (eight) hours as needed (vertigo). 07/04/20   Molpus, John, MD  fluticasone (FLONASE) 50 MCG/ACT  nasal spray Place 2 sprays into both nostrils daily. 03/17/15   Palumbo, April, MD  metFORMIN (GLUCOPHAGE) 500 MG tablet Take 1 tablet (500 mg total) by mouth 2 (two) times daily with a meal. 02/04/17   Palumbo, April, MD  OMEPRAZOLE PO Take by mouth.    [provider]  dicyclomine (BENTYL) 20 MG tablet Take 1 tablet (20 mg total) by mouth 2 (two) times daily. 11/23/18 03/11/20  Zadie Rhine, MD  loratadine (CLARITIN) 10 MG tablet Take 1 tablet (10 mg total) by mouth daily. 03/26/18 03/11/20  Palumbo, April, MD    Allergies    Tomato  Review of Systems   Review of Systems  Constitutional:  Positive for fatigue. Negative for chills, diaphoresis and fever.  Respiratory:  Positive for shortness of breath. Negative for cough.   Cardiovascular:  Negative for chest pain.  Gastrointestinal:  Negative for diarrhea, nausea and vomiting.  Musculoskeletal:  Positive for myalgias.  Neurological:  Positive for headaches.  All other systems reviewed and are negative.  Physical Exam Updated Vital Signs BP 127/85 (BP Location: Left Arm)   Pulse 86   Temp 98.2 F (36.8 C) (Oral)   Resp 18   Ht 5\' 11"  (1.803 m)   Wt 104.3 kg   SpO2 98%   BMI 32.08 kg/m  Physical Exam Vitals and nursing note reviewed.  Constitutional:      Appearance: He is not ill-appearing or diaphoretic.  HENT:     Head: Normocephalic and atraumatic.     Right Ear: Tympanic membrane normal.     Left Ear: Tympanic membrane normal.     Mouth/Throat:     Mouth: Mucous membranes are moist.     Pharynx: No oropharyngeal exudate.  Eyes:     Conjunctiva/sclera: Conjunctivae normal.  Cardiovascular:     Rate and Rhythm: Normal rate and regular rhythm.     Pulses: Normal pulses.  Pulmonary:     Effort: Pulmonary effort is normal.     Breath sounds: Normal breath sounds. No wheezing, rhonchi or rales.     Comments: Speaking in full sentences without difficulty. Satting 98% on RA. LCTAB.  Abdominal:     Tenderness:  There is no abdominal tenderness. There is no guarding or rebound.  Musculoskeletal:     Cervical back: Normal range of motion. No rigidity.  Skin:    General: Skin is warm and dry.     Coloration: Skin is not jaundiced.  Neurological:     Mental Status: He is alert.    ED Results / Procedures / Treatments   Labs (all labs ordered are listed, but only abnormal results are displayed) Labs Reviewed  RESP PANEL BY RT-PCR (FLU A&B, COVID) [id number]    EKG None  Radiology No results found.  Procedures Procedures   Medications Ordered in ED Medications - No data to display  ED Course  I have reviewed the triage vital signs and the nursing notes.  Pertinent labs & imaging results that were available during my care of the patient were reviewed by me and considered in my medical decision making (see chart for details).    MDM Rules/Calculators/A&P                           50 year old male who presents to the ED today with complaint of fatigue that started today.  His 14-year-old is COVID-positive.  He also complains of a headache, shortness of breath, body aches.  On arrival to the ED vitals are stable.  Patient has a COVID and flu test currently pending.  My exam he is sitting comfortably in the chair and texting on his phone.  He appears to be in no acute distress.  His lungs are clear to auscultation bilaterally and he is able speak in full sentences without difficulty.  Satting 98% on room air at rest.  He denies any chest pain.  His TMs are clear, posterior oropharynx clear.  Remainder physical exam unremarkable.  Feel patient requires additional work-up in the ED today.  He is instructed to follow-up with his COVID and flu test.  He is instructed to see his PCP this week for further evaluation should he test positive.  He is in agreement plan and stable for discharge.   This note was prepared using Dragon voice recognition software and may include unintentional dictation errors due  to the inherent limitations of voice recognition software.   Final Clinical Impression(s) / ED Diagnoses Final diagnoses:  Exposure to COVID-19 virus  Viral illness    Rx / DC Orders ED Discharge Orders     None        Discharge Instructions      Please follow up on your COVID and flu results. You can log into MyChart and check  the results that way.  Follow up with your PCP this week for further evaluation if you are positive for COVID or flu or if your symptoms continue  Take Ibuprofen and Tylenol as needed for symptoms. Drink plenty of fluids to stay hydrated.   Return to the ED for any worsening symptoms       Tanda Rockers, Cordelia Poche 08/12/21 2201    Glendora Score, MD 08/12/21 [phone]

## 2021-08-12 NOTE — ED Triage Notes (Signed)
Pt reports his child was dx'ed with COVID a few days ago, c/o fatigue and headache starting today

## 2021-08-12 NOTE — Discharge Instructions (Signed)
Please follow up on your COVID and flu results. You can log into MyChart and check the results that way.  Follow up with your PCP this week for further evaluation if you are positive for COVID or flu or if your symptoms continue  Take Ibuprofen and Tylenol as needed for symptoms. Drink plenty of fluids to stay hydrated.   Return to the ED for any worsening symptoms

## 2021-08-13 LAB — RESP PANEL BY RT-PCR (FLU A&B, COVID) ARPGX2
Influenza A by PCR: NEGATIVE
Influenza B by PCR: NEGATIVE
SARS Coronavirus 2 by RT PCR: NEGATIVE

## 2022-01-21 ENCOUNTER — Encounter (HOSPITAL_BASED_OUTPATIENT_CLINIC_OR_DEPARTMENT_OTHER): Payer: Self-pay

## 2022-01-21 ENCOUNTER — Emergency Department (HOSPITAL_BASED_OUTPATIENT_CLINIC_OR_DEPARTMENT_OTHER): Payer: Self-pay

## 2022-01-21 ENCOUNTER — Other Ambulatory Visit: Payer: Self-pay

## 2022-01-21 ENCOUNTER — Emergency Department (HOSPITAL_BASED_OUTPATIENT_CLINIC_OR_DEPARTMENT_OTHER)
Admission: EM | Admit: 2022-01-21 | Discharge: 2022-01-22 | Disposition: A | Discharge status: Home / Self Care | Payer: Self-pay | Attending: Emergency Medicine | Admitting: Emergency Medicine

## 2022-01-21 DIAGNOSIS — E119 Type 2 diabetes mellitus without complications: Secondary | ICD-10-CM | POA: Insufficient documentation

## 2022-01-21 DIAGNOSIS — R079 Chest pain, unspecified: Secondary | ICD-10-CM | POA: Insufficient documentation

## 2022-01-21 DIAGNOSIS — R002 Palpitations: Secondary | ICD-10-CM | POA: Insufficient documentation

## 2022-01-21 LAB — BASIC METABOLIC PANEL
Anion gap: 10 (ref 5–15)
BUN: 14 mg/dL (ref 6–20)
CO2: 23 mmol/L (ref 22–32)
Calcium: 9.4 mg/dL (ref 8.9–10.3)
Chloride: 105 mmol/L (ref 98–111)
Creatinine, Ser: 0.97 mg/dL (ref 0.61–1.24)
GFR, Estimated: >60 mL/min (ref >60)
Glucose, Bld: 96 mg/dL (ref 70–99)
Potassium: 3.6 mmol/L (ref 3.5–5.1)
Sodium: 138 mmol/L (ref 135–145)

## 2022-01-21 LAB — TROPONIN I (HIGH SENSITIVITY): Troponin I (High Sensitivity): <2 ng/L (ref <18)

## 2022-01-21 LAB — CBC
HCT: 41.2 % (ref 39.0–52.0)
Hemoglobin: 13.8 g/dL (ref 13.0–17.0)
MCH: 29.7 pg (ref 26.0–34.0)
MCHC: 33.5 g/dL (ref 30.0–36.0)
MCV: 88.8 fL (ref 80.0–100.0)
Platelets: 372 10*3/uL (ref 150–400)
RBC: 4.64 MIL/uL (ref 4.22–5.81)
RDW: 14.6 % (ref 11.5–15.5)
WBC: 10.4 10*3/uL (ref 4.0–10.5)
nRBC: 0 % (ref 0.0–0.2)

## 2022-01-21 LAB — LIPASE, BLOOD: Lipase: 36 U/L (ref 11–51)

## 2022-01-21 NOTE — ED Triage Notes (Signed)
Patient complains of mid sternal chest tightness and palpations.  Symptoms began intermittently 1-2 weeks ago.  Denies caffeine intake.  States he has bilateral arm weakness during the palpations.  Denies cardiac history.  Denies nausea/vomiting.   ?

## 2022-01-22 LAB — HEPATIC FUNCTION PANEL
ALT: 37 U/L (ref 0–44)
AST: 31 U/L (ref 15–41)
Albumin: 3.8 g/dL (ref 3.5–5.0)
Alkaline Phosphatase: 71 U/L (ref 38–126)
Bilirubin, Direct: <0.1 mg/dL (ref 0.0–0.2)
Total Bilirubin: 0.4 mg/dL (ref 0.3–1.2)
Total Protein: 7.4 g/dL (ref 6.5–8.1)

## 2022-01-22 LAB — D-DIMER, QUANTITATIVE: D-Dimer, Quant: <0.27 ug/mL-FEU (ref 0.00–0.50)

## 2022-01-22 LAB — TROPONIN I (HIGH SENSITIVITY): Troponin I (High Sensitivity): 3 ng/L (ref <18)

## 2022-01-22 NOTE — ED Provider Notes (Signed)
?[facility] EMERGENCY DEPARTMENT ?Provider Note ? ? ?CSN: [id number] ?Arrival date & time: 01/21/22  2042 ? ?  ? ?History ? ?Chief Complaint  ?Patient presents with  ? Chest Pain  ? Palpitations  ? ? ?Aaron Weber is a 51 y.o. male. ? ?Patient complains of fluttering sensation in his chest and "heart skipping beats".  Symptoms started while he was resting at home today around 6 PM.  Now resolved after about 2 or 3 hours.  Contrary to triage note he denies chest pain but states it felt like his heart was skipping beats and fluttering.  No shortness of breath, nausea, vomiting, diaphoresis.  No cough or fever.  Similar presentation April 1 at outside hospital.  He was scheduled for a stress test next week.  Reports stress test in the past remotely which is reassuring.  Has a history of diabetes.  Has a history of acid reflux and GERD as well.  No nausea or vomiting.  No weakness, numbness or tingling.  Reports the fluttering sensation has since resolved.  Felt like his heart was skipping beats but denies having chest pain.  There is no radiation of the pain.  No abdominal pain. ? ? ? ?  ? ?Home Medications ?Prior to Admission medications   ?Medication Sig Start Date End Date Taking? Authorizing Provider  ?amLODipine (NORVASC) 5 MG tablet Take 5 mg by mouth daily.    [provider]  ?bimatoprost (LUMIGAN) 0.03 % ophthalmic solution 1 drop at bedtime.    [provider]  ?chlorhexidine (PERIDEX) 0.12 % solution Use as directed 15 mLs in the mouth or throat 2 (two) times daily. 11/24/20   Maxwell CaulLayden, Lindsey A, PA-C  ?diazepam (VALIUM) 5 MG tablet Take 1 tablet (5 mg total) by mouth every 8 (eight) hours as needed (vertigo). 07/04/20   Molpus, John, MD  ?fluticasone (FLONASE) 50 MCG/ACT nasal spray Place 2 sprays into both nostrils daily. 03/17/15   Palumbo, April, MD  ?metFORMIN (GLUCOPHAGE) 500 MG tablet Take 1 tablet (500 mg total) by mouth 2 (two) times daily with a meal. 02/04/17   Palumbo,  April, MD  ?OMEPRAZOLE PO Take by mouth.    [provider]  ?dicyclomine (BENTYL) 20 MG tablet Take 1 tablet (20 mg total) by mouth 2 (two) times daily. 11/23/18 03/11/20  Zadie RhineWickline, Donald, MD  ?loratadine (CLARITIN) 10 MG tablet Take 1 tablet (10 mg total) by mouth daily. 03/26/18 03/11/20  Palumbo, April, MD  ?   ? ?Allergies    ?Tomato   ? ?Review of Systems   ?Review of Systems  ?HENT:  Negative for congestion, rhinorrhea and tinnitus.   ?Respiratory:  Positive for chest tightness.   ? all other systems are negative except as noted in the HPI and PMH.  ? ?Physical Exam ?Updated Vital Signs ?BP (!) 134/91 (BP Location: Right Arm)   Pulse 76   Temp 98.3 ?F (36.8 ?C) (Oral)   Resp (!) 21   Ht 5\' 11"  (1.803 m)   Wt 103.9 kg   SpO2 98%   BMI 31.94 kg/m?  ?Physical Exam ?Vitals and nursing note reviewed.  ?Constitutional:   ?   General: He is not in acute distress. ?   Appearance: He is well-developed.  ?HENT:  ?   Head: Normocephalic and atraumatic.  ?   Mouth/Throat:  ?   Pharynx: No oropharyngeal exudate.  ?Eyes:  ?   Conjunctiva/sclera: Conjunctivae normal.  ?   Pupils: Pupils are equal, round,  and reactive to light.  ?Neck:  ?   Comments: No meningismus. ?Cardiovascular:  ?   Rate and Rhythm: Regular rhythm.  ?   Heart sounds: Normal heart sounds. No murmur heard. ?Pulmonary:  ?   Effort: Pulmonary effort is normal. No respiratory distress.  ?   Breath sounds: Normal breath sounds.  ?Abdominal:  ?   Palpations: Abdomen is soft.  ?   Tenderness: There is no abdominal tenderness. There is no guarding or rebound.  ?Musculoskeletal:     ?   General: No tenderness. Normal range of motion.  ?   Cervical back: Normal range of motion and neck supple.  ?Skin: ?   General: Skin is warm.  ?Neurological:  ?   Mental Status: He is alert and oriented to person, place, and time.  ?   Cranial Nerves: No cranial nerve deficit.  ?   Motor: No abnormal muscle tone.  ?   Coordination: Coordination normal.  ?   Comments:   5/5 strength throughout. CN 2-12 intact.Equal grip strength.   ?Psychiatric:     ?   Behavior: Behavior normal.  ? ? ?ED Results / Procedures / Treatments   ?Labs ?(all labs ordered are listed, but only abnormal results are displayed) ?Labs Reviewed  ?BASIC METABOLIC PANEL  ?CBC  ?LIPASE, BLOOD  ?D-DIMER, QUANTITATIVE  ?HEPATIC FUNCTION PANEL  ?TROPONIN I (HIGH SENSITIVITY)  ?TROPONIN I (HIGH SENSITIVITY)  ? ? ?EKG ?EKG Interpretation ? ?Date/Time:  Tuesday January 21 2022 20:56:25 EDT ?Ventricular Rate:  96 ?PR Interval:  172 ?QRS Duration: 82 ?QT Interval:  332 ?QTC Calculation: 419 ?R Axis:   39 ?Text Interpretation: Normal sinus rhythm Septal infarct , age undetermined Abnormal ECG When compared with ECG of 31-Dec-2020 13:29, PREVIOUS ECG IS PRESENT No significant change was found Confirmed by Glynn Octave [phone]) on 01/22/2022 12:23:44 AM ? ?Radiology ?DG Chest 2 View ? ?Result Date: 01/21/2022 ?CLINICAL DATA:  Midsternal chest pain for 2 weeks, initial encounter EXAM: CHEST - 2 VIEW COMPARISON:  01/11/2022 FINDINGS: The heart size and mediastinal contours are within normal limits. Both lungs are clear. The visualized skeletal structures are unremarkable. IMPRESSION: No active cardiopulmonary disease. Electronically Signed   By: Alcide Clever M.D.   On: 01/21/2022 21:13   ? ?Procedures ?Procedures  ? ? ?Medications Ordered in ED ?Medications - No data to display ? ?ED Course/ Medical Decision Making/ A&P ?  ?                        ?Medical Decision Making ?Amount and/or Complexity of Data Reviewed ?Labs: ordered. ? ?Episode of palpitations that has since resolved.  No chest pain.  EKG is sinus rhythm without acute ischemia.  Septal Q waves appear to be chronic. ? ?CXR negative for PTX Or pneumonia. Results reviewed and interpreted by me.  ?Troponin negative x2. D-dimer negative.  ?Low suspicion for ACS or PE.  ?LFTS and lipase normal.  ? ?No further palpitations and no chest pain. Resting comfortably on  multiple rechecks.  ?Has stress test scheduled next week. MI ruled out today. Patient aware that he is low risk for ACS but not zero risk.  ?Return to the ED with exertional chest pain, shortness of breath, vomiting, diaphoresis, or any other concerns.  ? ? ? ? ? ? ?Final Clinical Impression(s) / ED Diagnoses ?Final diagnoses:  ?None  ? ? ?Rx / DC Orders ?ED Discharge Orders   ? ? None  ? ?  ? ? ?  ?  Glynn Octave, MD ?01/22/22 1515 ? ?

## 2022-01-22 NOTE — Discharge Instructions (Signed)
Follow-up with your cardiologist as scheduled for a stress test.  Return to the ED chest pain with exertion, shortness of breath, nausea, vomiting, sweating, other concerns. ?

## 2022-01-22 NOTE — ED Notes (Signed)
Pt hooked up to the monitor with a 5 lead, BP cuff and pulse ox ?
